# Patient Record
Sex: Female | Born: 1960 | Race: White | Hispanic: No | Marital: Married | Smoking: Former smoker
Health system: Southern US, Community
[De-identification: ages and names within clinical notes are randomized; demographics above are authoritative.]

## PROBLEM LIST (undated history)

## (undated) DIAGNOSIS — E785 Hyperlipidemia, unspecified: Secondary | ICD-10-CM

## (undated) DIAGNOSIS — K219 Gastro-esophageal reflux disease without esophagitis: Secondary | ICD-10-CM

## (undated) DIAGNOSIS — K5790 Diverticulosis of intestine, part unspecified, without perforation or abscess without bleeding: Secondary | ICD-10-CM

## (undated) HISTORY — DX: Hyperlipidemia, unspecified: E78.5

## (undated) HISTORY — DX: Gastro-esophageal reflux disease without esophagitis: K21.9

## (undated) HISTORY — PX: SIGMOIDOSCOPY: SUR1295

## (undated) HISTORY — DX: Diverticulosis of intestine, part unspecified, without perforation or abscess without bleeding: K57.90

## (undated) HISTORY — PX: POLYPECTOMY: SHX149

## (undated) HISTORY — PX: BREAST BIOPSY: SHX20

## (undated) HISTORY — PX: COLONOSCOPY: SHX174

---

## 2002-08-06 ENCOUNTER — Ambulatory Visit (HOSPITAL_COMMUNITY): Admission: RE | Admit: 2002-08-06 | Discharge: 2002-08-06 | Payer: Self-pay

## 2002-09-11 ENCOUNTER — Ambulatory Visit (HOSPITAL_COMMUNITY): Admission: RE | Admit: 2002-09-11 | Discharge: 2002-09-11 | Payer: Self-pay | Admitting: Gastroenterology

## 2005-02-24 ENCOUNTER — Other Ambulatory Visit: Admission: RE | Admit: 2005-02-24 | Discharge: 2005-02-24 | Payer: Self-pay | Admitting: Gynecology

## 2005-08-30 ENCOUNTER — Ambulatory Visit: Payer: Self-pay | Admitting: General Practice

## 2005-10-11 ENCOUNTER — Other Ambulatory Visit: Admission: RE | Admit: 2005-10-11 | Discharge: 2005-10-11 | Payer: Self-pay | Admitting: Gynecology

## 2006-07-14 ENCOUNTER — Other Ambulatory Visit: Admission: RE | Admit: 2006-07-14 | Discharge: 2006-07-14 | Payer: Self-pay | Admitting: Gynecology

## 2006-09-13 ENCOUNTER — Ambulatory Visit: Payer: Self-pay | Admitting: General Practice

## 2007-09-18 ENCOUNTER — Ambulatory Visit: Payer: Self-pay | Admitting: General Practice

## 2007-10-15 ENCOUNTER — Ambulatory Visit: Payer: Self-pay | Admitting: General Practice

## 2008-03-04 ENCOUNTER — Emergency Department (HOSPITAL_COMMUNITY): Admission: EM | Admit: 2008-03-04 | Discharge: 2008-03-04 | Payer: Self-pay | Admitting: Emergency Medicine

## 2008-05-26 ENCOUNTER — Ambulatory Visit: Payer: Self-pay | Admitting: General Practice

## 2008-07-28 ENCOUNTER — Encounter: Admission: RE | Admit: 2008-07-28 | Discharge: 2008-07-28 | Payer: Self-pay | Admitting: General Practice

## 2009-01-18 ENCOUNTER — Inpatient Hospital Stay (HOSPITAL_COMMUNITY): Admission: EM | Admit: 2009-01-18 | Discharge: 2009-01-19 | Payer: Self-pay | Admitting: *Deleted

## 2009-01-19 ENCOUNTER — Encounter (INDEPENDENT_AMBULATORY_CARE_PROVIDER_SITE_OTHER): Payer: Self-pay | Admitting: Gastroenterology

## 2009-07-20 ENCOUNTER — Ambulatory Visit: Payer: Self-pay | Admitting: Women's Health

## 2009-07-20 ENCOUNTER — Encounter: Payer: Self-pay | Admitting: Women's Health

## 2009-07-20 ENCOUNTER — Other Ambulatory Visit: Admission: RE | Admit: 2009-07-20 | Discharge: 2009-07-20 | Payer: Self-pay | Admitting: Gynecology

## 2009-09-30 ENCOUNTER — Ambulatory Visit: Payer: Self-pay | Admitting: General Practice

## 2009-10-19 ENCOUNTER — Ambulatory Visit: Payer: Self-pay | Admitting: General Practice

## 2010-01-18 ENCOUNTER — Ambulatory Visit: Payer: Self-pay | Admitting: Women's Health

## 2010-07-26 ENCOUNTER — Other Ambulatory Visit: Admission: RE | Admit: 2010-07-26 | Discharge: 2010-07-26 | Payer: Self-pay | Admitting: Gynecology

## 2010-07-26 ENCOUNTER — Ambulatory Visit: Payer: Self-pay | Admitting: Women's Health

## 2010-09-22 ENCOUNTER — Ambulatory Visit: Payer: Self-pay | Admitting: General Practice

## 2011-03-15 LAB — BASIC METABOLIC PANEL
BUN: 3 mg/dL — ABNORMAL LOW (ref 6–23)
BUN: 5 mg/dL — ABNORMAL LOW (ref 6–23)
BUN: 9 mg/dL (ref 6–23)
CO2: 30 mEq/L (ref 19–32)
Calcium: 8.3 mg/dL — ABNORMAL LOW (ref 8.4–10.5)
Chloride: 105 mEq/L (ref 96–112)
Chloride: 105 mEq/L (ref 96–112)
Creatinine, Ser: 0.61 mg/dL (ref 0.4–1.2)
Creatinine, Ser: 0.66 mg/dL (ref 0.4–1.2)
GFR calc non Af Amer: >60 mL/min (ref >60)
Glucose, Bld: 103 mg/dL — ABNORMAL HIGH (ref 70–99)
Glucose, Bld: 93 mg/dL (ref 70–99)
Potassium: 4.2 mEq/L (ref 3.5–5.1)

## 2011-03-15 LAB — DIFFERENTIAL
Eosinophils Relative: 6 % — ABNORMAL HIGH (ref 0–5)
Lymphocytes Relative: 34 % (ref 12–46)
Lymphs Abs: 2.3 10*3/uL (ref 0.7–4.0)
Monocytes Absolute: 0.6 10*3/uL (ref 0.1–1.0)
Monocytes Relative: 8 % (ref 3–12)

## 2011-03-15 LAB — URINALYSIS, ROUTINE W REFLEX MICROSCOPIC
Glucose, UA: NEGATIVE mg/dL
Hgb urine dipstick: NEGATIVE
Protein, ur: NEGATIVE mg/dL

## 2011-03-15 LAB — HEMOGLOBIN AND HEMATOCRIT, BLOOD
HCT: 35.2 % — ABNORMAL LOW (ref 36.0–46.0)
HCT: 35.8 % — ABNORMAL LOW (ref 36.0–46.0)
Hemoglobin: 11.8 g/dL — ABNORMAL LOW (ref 12.0–15.0)
Hemoglobin: 12.7 g/dL (ref 12.0–15.0)

## 2011-03-15 LAB — CBC
HCT: 36.1 % (ref 36.0–46.0)
Hemoglobin: 12.6 g/dL (ref 12.0–15.0)
RBC: 3.97 MIL/uL (ref 3.87–5.11)
WBC: 6.7 10*3/uL (ref 4.0–10.5)

## 2011-03-15 LAB — SAMPLE TO BLOOD BANK

## 2011-03-15 LAB — POCT PREGNANCY, URINE: Preg Test, Ur: NEGATIVE

## 2011-03-15 LAB — PROTIME-INR: Prothrombin Time: 13.1 seconds (ref 11.6–15.2)

## 2011-03-15 LAB — TYPE AND SCREEN
ABO/RH(D): O NEG
Antibody Screen: NEGATIVE

## 2011-04-12 NOTE — Discharge Summary (Signed)
Kimberly Hall, Kimberly Hall            ACCOUNT NO.:  [id number]   MEDICAL RECORD NO.:  [id number]          PATIENT TYPE:  INP   LOCATION:  6737                         FACILITY:  [facility]   PHYSICIAN:  Theodosia Paling, MD    DATE OF BIRTH:  04-13-1961   DATE OF ADMISSION:  01/18/2009  DATE OF DISCHARGE:  01/18/2009                               DISCHARGE SUMMARY   GI PHYSICIAN:  Lionel December, MD   ADMITTING HISTORY:  Please refer to the excellent admission note  dictated by Dr. Della Goo under history of present illness.   DISCHARGE DIAGNOSES:  1. Rectal bleed.  2. Colonic polyps.   DISCHARGE MEDICATIONS:  None.   CONSULTATION PERFORMED:  Dr. Karilyn Cota from GI was called to evaluate the  patient who evaluated the patient and recommended to follow up with her  tomorrow morning.  The patient expresses interest to go home as well.   HOSPITAL COURSE:  Following issues were addressed during the hospital  stay:  GI bleed.  Most likely it is diverticular/hemorrhoidal.  Last  colonoscopy was performed on the patient on September 11, 2008, which was  essentially negative.  H and H remains stable.  Hemodynamically, she  remains stable.  She did not have further episode of GI bleed.  The  patient will be following up with Dr. Karilyn Cota in her clinic tomorrow  morning.   DISPOSITION:  The patient is to follow with Dr. Karilyn Cota on January 19, 2009.   PROCEDURE PERFORMED:  None.   IMAGING PERFORMED:  None.   Total time spent in discharge of this patient 45 minutes.      Theodosia Paling, MD  Electronically Signed     NP/MEDQ  D:  01/18/2009  T:  01/19/2009  Job:  [id number]   cc:   Lionel December, M.D.

## 2011-04-12 NOTE — Consult Note (Signed)
NAMEMAILI, SHUTTERS NO.:  [id number]   MEDICAL RECORD NO.:  [id number]          PATIENT TYPE:  INP   LOCATION:  6737                         FACILITY:  [facility]   PHYSICIAN:  Graylin Shiver, M.D.   DATE OF BIRTH:  [birth year]   DATE OF CONSULTATION:  01/19/2009  DATE OF DISCHARGE:                                 CONSULTATION   We were asked to see this patient in consultation for rectal bleeding by  Dr. Glade Lloyd of the [facility].   HISTORY OF PRESENT ILLNESS:  This is a very pleasant 50 year old female  who speaks broken [place].  She has a history of grade 4 colon polyp in  1997 and she reports hematochezia like a small.  Increasing in amounts  over the last 5 days.  The patient describes abdominal pressure, but no  abdominal pain and her bowel movements tend to be constipated.  She has  been taking Aleve and ibuprofen for chronic headaches.  Her last  colonoscopy was in January 2009, copy of the reports on the chart.  She  has 3 hyperplastic polyps removed.  The patient does report some  hemorrhoids after the birth of her 2 children some 20 years ago but none  since.  Her hemoglobin is very stable.  The patient and family are  currently upset about lack of workup in the hospital so far this  admission.   Past medical history is significant only for colon polyps as noted above  and constipation predominant IVF.   Current medications at home are none except for Aleve and ibuprofen as  needed for headaches.  The patient reports that this is almost daily.   CURRENT ALLERGIES:  PENICILLIN.   Review of systems is significant for some left lower quadrant abdominal  pressure, recurrent headaches, no weight loss, no fever.   Social history is positive for occasional alcohol.  She says she smoked  for 25 years but quit smoking 7 or 8 years ago.  Currently works at  [employer].   Family history is unknown by the patient.   PHYSICAL EXAMINATION:   GENERAL:  She is alert and oriented in no  apparent distress.  She does speak broken [place].  VITAL SIGNS:  Temperature 98.2, pulse 69, respirations 18, blood  pressure is 106/82.  HEART:  Regular rate and rhythm.  LUNGS:  No wheezes or crackles.  ABDOMEN:  Soft, nontender, nondistended with good bowel sounds.   Labs show a hemoglobin of 12.5 this is stable, hematocrit 35.8.  Coags  are normal.  BUN 3, creatinine is 0.66.   ASSESSMENT:  Dr. Wandalee Ferdinand has seen and examined the patient, collected  history, and reviewed her chart.  His impression is this is a 47-year-  old female with recurrent rectal bleeding, possibly internal hemorrhoids  or diverticular bleed.   PLAN:  We will move forward with colonoscopy this afternoon.  Further  recommendations will be forthcoming after a colonoscopy.   Thanks very much for this consultation.      Stephani Police, PA    ______________________________  Graylin Shiver, M.D.  MLY/MEDQ  D:  01/19/2009  T:  01/20/2009  Job:  [id number]

## 2011-04-12 NOTE — Op Note (Signed)
Kimberly Hall, Kimberly Hall            ACCOUNT NO.:  [id number]   MEDICAL RECORD NO.:  [id number]          PATIENT TYPE:  INP   LOCATION:  6737                         FACILITY:  [facility]   PHYSICIAN:  Graylin Shiver, M.D.   DATE OF BIRTH:  Mar 11, 1961   DATE OF PROCEDURE:  01/19/2009  DATE OF DISCHARGE:  01/19/2009                               OPERATIVE REPORT   INDICATIONS FOR PROCEDURE:  Rectal bleeding.   Informed consent was obtained after explanation of the risks of  bleeding, infection, and perforation.   PREMEDICATION:  Fentanyl 60 mcg IV and Versed 5 mg IV.   PROCEDURE:  With the patient in the left lateral decubitus position, a  rectal exam was performed.  No masses were felt.  The Pentax colonoscope  was inserted into the rectum and advanced around the colon to the cecum.  Cecal landmarks were identified.  The cecum and ascending colon were  normal.  The transverse colon was normal.  The descending colon and  sigmoid were normal.  In the distal rectum extending from the anal verge  up to about 8 cm, the mucosa was inflamed and looked somewhat ulcerated  with exudate on it.  This looked like a proctitis.  Biopsies were  obtained.  She tolerated the procedure well without complications.   IMPRESSION:  Distal proctitis.   PLAN:  The biopsies will be checked.  The patient will be started on  Canasa suppository 1000 mg at bedtime.           ______________________________  Graylin Shiver, M.D.     SFG/MEDQ  D:  01/19/2009  T:  01/20/2009  Job:  [id number]   cc:   [facility]

## 2011-04-12 NOTE — H&P (Signed)
Kimberly Hall, Kimberly Hall            ACCOUNT NO.:  [id number]   MEDICAL RECORD NO.:  [id number]          PATIENT TYPE:  INP   LOCATION:  6737                         FACILITY:  [facility]   PHYSICIAN:  Della Goo, M.D. DATE OF BIRTH:  [birth year]   DATE OF ADMISSION:  01/17/2009  DATE OF DISCHARGE:                              HISTORY & PHYSICAL   PRIMARY CARE PHYSICIAN:  Unassigned.   CHIEF COMPLAINT:  Rectal bleeding.   HISTORY OF PRESENT ILLNESS:  This is a 50 year old female who presents  to the emergency department with complaints of rectal bleeding which she  has had for the past 5 days.  She reports having 3 to 4 episodes daily.  She states that she has been passing bright red blood.  She also reports  that the bleeding has been associated with lower abdominal pain and  rectal pain and pressure.  She states that she did have abdominal  discomfort for over 2 weeks prior to seeing any bloody stool.  She  states that she did not have any previous similar episodes.  The patient  has had colon polyps in the past and had a recent colonoscopy performed  in 2009 by Dr. Charna Elizabeth which revealed benign polyps at that time  which were also removed.  The patient denies having any nausea,  vomiting.  She denies having any decrease in appetite or intake of foods  or liquids.  She denies having any fevers, chills, chest pain, shortness  of breath.  She does report having weakness and mild lightheadedness.   When the patient was evaluated in the emergency department, her  hemoglobin was found to be 12.6 with hematocrit 36.1.  Her MCV was found  to be 91.0.  The patient was referred for admission.   PAST MEDICAL HISTORY:  Significant for colon polyps.   PAST SURGICAL HISTORY:  History of previous colonoscopies with polyp  removal.  The patient had a colonoscopy in 2003 by Dr. Loreta Ave and a repeat  colonoscopy in 2008 or 2009 by Dr. Loreta Ave.   MEDICATIONS:  None.   ALLERGIES:  PENICILLIN.   SOCIAL HISTORY:  The patient is a nonsmoker, nondrinker.   FAMILY HISTORY:  Negative for coronary artery disease, diabetes  mellitus, hypertension.  Positive for cancer in her father who had lung  cancer and was a heavy smoker.   REVIEW OF SYSTEMS:  Pertinents are mentioned above, otherwise they are  negative.   PHYSICAL EXAMINATION:  FINDINGS:  This is a 50 year old, overweight,  well-developed female in discomfort but no acute distress.  VITAL SIGNS: Temperature 96.6, blood pressure 134/91, heart rate 69,  respirations 18, O2 saturation 97%.  HEENT EXAMINATION:  Normocephalic, atraumatic.  There is no scleral  icterus.  Pupils are equally round, reactive to light.  Extraocular  movements are intact.  Funduscopic benign.  Nares patent bilaterally.  Oropharynx is clear.  NECK:  Supple, full range of motion.  No thyromegaly, adenopathy, or  jugular venous distention.  CARDIOVASCULAR:  Regular rate and rhythm.  No murmurs, gallops or rubs.  LUNGS: Clear to auscultation bilaterally.  ABDOMEN: Positive bowel sounds, soft,  nontender, nondistended.  EXTREMITIES: Without cyanosis, clubbing or edema.  NEUROLOGIC EXAMINATION:  Alert and oriented x3.  There are no focal  deficits.  RECTAL:  Examination performed by the EDP results of which were  Hemoccult positive.   LABORATORY STUDIES:  White blood cell count 6.7, hemoglobin 12.6,  hematocrit 36.1, platelets 173, MCV as mentioned above, neutrophils 51%  lymphocytes 34%.  Sodium 139, potassium 3.9, chloride 105, bicarb 30,  BUN 9, creatinine 0.61 and glucose 103.  Pro time 13.1, INR 1.0 and PTT  32.   ASSESSMENT:  A 50 year old female being admitted with:  1. Rectal bleeding.  2. Weakness secondary to #1.  3. Abdominal pain.   PLAN:  The patient will be admitted to a telemetry area for monitoring  and type and screen will be ordered.  At this time, the patient's  hemoglobin is good and the patient is hemodynamically stable.  However,   if the patient show signs of instability or continued bleeding, a  transfusion will be given.  IV Protonix has been ordered, however this  appears to be a lower GI bleed.  Serial H and H is will be performed to  monitor the patient's hemoglobin and hematocrit.  The patient will be  placed in SCDs for DVT prophylaxis and a GI consultation will be  requested with Dr. Loreta Ave or her on-call coverage.  Further workup will  ensue pending results of the patient's clinical course and her studies.      Della Goo, M.D.  Electronically Signed     HJ/MEDQ  D:  01/18/2009  T:  01/18/2009  Job:  [id number]

## 2011-04-15 NOTE — Op Note (Signed)
   [name], Kimberly Hall                        ACCOUNT NO.:  [id number]   MEDICAL RECORD NO.:  [id number]                   PATIENT TYPE:  AMB   LOCATION:  ENDO                                 FACILITY:  [facility]   PHYSICIAN:  Anselmo Rod, M.D.               DATE OF BIRTH:  [birth year]   DATE OF PROCEDURE:  09/11/2002  DATE OF DISCHARGE:                                 OPERATIVE REPORT   PROCEDURE:  Colonoscopy.   ENDOSCOPIST:  Charna Elizabeth, M.D.   INSTRUMENT USED:  Olympus video colonoscope (adjustable pediatric scope).   INDICATIONS FOR PROCEDURE:  [age] [place] female with a history  of colonic polyps last removed in 1998 by Dr. Carman Ching.  Rule out  recurrent polyps.  The patient also has had occasional blood with hard  stool.   PROCEDURE PERFORMED:  Informed consent was procured from the patient.  The  patient fasted for eight hours prior to the procedure and prepped with a  bottle of magnesium citrate and a gallon of GOLYTELY the night prior to the  procedure.   PREPROCEDURE PHYSICAL EXAMINATION:  VITAL SIGNS:  The patient had stable  vital signs.  NECK:  Supple.  CHEST:  Clear to auscultation.  HEART:  S1 and S2 regular.  ABDOMEN:  Soft with normal bowel sounds.   DESCRIPTION OF PROCEDURE:  The patient was placed in the left lateral  decubitus position, sedated with 80 mg of Demerol and 9 mg of Versed  intravenously.  Once the patient was adequately sedated and maintained on  low flow oxygen, continuous cardiac monitoring, the Olympus video  colonoscope was advanced from the rectum to the cecum without difficulty.  The entire colonic mucosa appeared normal with no masses, polyps, erosions,  ulcerations or diverticula.  The appendiceal orifice and ileocecal valve  were clearly visualized and photographed.   IMPRESSION:  Normal colonoscopy.   RECOMMENDATIONS:  1. Considering her personal history of colonic polyps, repeat colorectal     cancer  screening is recommended in the     next five years unless the patient develops any abnormal symptoms in the     interim.  2. A high fiber with liberal fluid intake has been advocated to promote soft     stools.  3. Outpatient followup on a p.r.n. basis.                                               Anselmo Rod, M.D.    JNM/MEDQ  D:  09/11/2002  T:  09/11/2002  Job:  [id number]   cc:   Gabriel Earing, MD  [address]  [place]  [place] [address]  Fax: [phone]

## 2011-08-23 LAB — POCT PREGNANCY, URINE: Operator id: 277751

## 2011-08-23 LAB — URINE MICROSCOPIC-ADD ON

## 2011-08-23 LAB — DIFFERENTIAL
Basophils Absolute: 0
Basophils Relative: 1
Eosinophils Relative: 5
Monocytes Absolute: 0.5

## 2011-08-23 LAB — URINALYSIS, ROUTINE W REFLEX MICROSCOPIC
Glucose, UA: NEGATIVE
Nitrite: POSITIVE — AB
Protein, ur: NEGATIVE
pH: 7

## 2011-08-23 LAB — CBC
HCT: 35.7 — ABNORMAL LOW
Hemoglobin: 12.1
MCHC: 34
RDW: 13.3

## 2011-08-23 LAB — APTT: aPTT: 30

## 2011-09-21 ENCOUNTER — Ambulatory Visit: Payer: Self-pay | Admitting: General Practice

## 2011-10-28 ENCOUNTER — Ambulatory Visit (INDEPENDENT_AMBULATORY_CARE_PROVIDER_SITE_OTHER): Payer: BC Managed Care – PPO | Admitting: Gastroenterology

## 2011-10-28 ENCOUNTER — Encounter: Payer: Self-pay | Admitting: Gastroenterology

## 2011-10-28 DIAGNOSIS — Z86010 Personal history of colonic polyps: Secondary | ICD-10-CM

## 2011-10-28 DIAGNOSIS — K6289 Other specified diseases of anus and rectum: Secondary | ICD-10-CM

## 2011-10-28 MED ORDER — HYOSCYAMINE SULFATE ER 0.375 MG PO TBCR: EXTENDED_RELEASE_TABLET | ORAL | Status: DC

## 2011-10-28 NOTE — Assessment & Plan Note (Signed)
Plan followup colonoscopy 2015 

## 2011-10-28 NOTE — Progress Notes (Signed)
History of Present Illness: Kimberly Hall is a 50 year old white female with history of proctitis here for evaluation of rectal bleeding. Over the past 2 weeks she's had limited rectal bleeding consisting of small amounts of bright red blood per rectum. Stools have been loose and accompanied by mild low abdominal pain. She has a history of proctitis for which she has been treated.  She underwent undergone colonoscopy in 2009 and 2010. She has a remote history of polyps. After 9 days of Rowasa suppositories her bleeding has subsided. She still is left with very mild lower bowel discomfort.    Past Medical History  Diagnosis Date  . Hyperlipemia   . History of colon polyps   . GERD (gastroesophageal reflux disease)   . Proctitis 2010    Colonoscopy--Dr. Evette Cristal    No past surgical history on file. family history includes Lung cancer in her father.  There is no history of Colon cancer. Current Outpatient Prescriptions  Medication Sig Dispense Refill  . mesalamine (CANASA) 1000 MG suppository Place 1,000 mg rectally at bedtime.        Marland Kitchen omeprazole (PRILOSEC) 20 MG capsule Take 20 mg by mouth daily.         Allergies as of 10/28/2011 - Review Complete 10/28/2011  Allergen Reaction Noted  . Penicillins  10/27/2011    reports that she has quit smoking. She has never used smokeless tobacco. She reports that she drinks alcohol. She reports that she does not use illicit drugs.     Review of Systems: Pertinent positive and negative review of systems were noted in the above HPI section. All other review of systems were otherwise negative.  Vital signs were reviewed in today's medical record Physical Exam: General: Well developed , well nourished, no acute distress Head: Normocephalic and atraumatic Eyes:  sclerae anicteric, EOMI Ears: Normal auditory acuity Mouth: No deformity or lesions Neck: Supple, no masses or thyromegaly Lungs: Clear throughout to auscultation Heart: Regular rate and  rhythm; no murmurs, rubs or bruits Abdomen: Soft, non tender and non distended. No masses, hepatosplenomegaly or hernias noted. Normal Bowel sounds Rectal:deferred Musculoskeletal: Symmetrical with no gross deformities  Skin: No lesions on visible extremities Pulses:  Normal pulses noted Extremities: No clubbing, cyanosis, edema or deformities noted Neurological: Alert oriented x 4, grossly nonfocal Cervical Nodes:  No significant cervical adenopathy Inguinal Nodes: No significant inguinal adenopathy Psychological:  Alert and cooperative. Normal mood and affect

## 2011-10-28 NOTE — Assessment & Plan Note (Signed)
She's had a recent flare that has responded to Rowasa suppositories  Recommendations #1 no further therapy at this point #2 hyomax when necessary abdominal pain

## 2011-10-28 NOTE — Patient Instructions (Signed)
We are sending in a prescription today to your pharmacy Call back as needed Your Follow up Colonoscopy will be due in 2015

## 2011-10-31 ENCOUNTER — Telehealth: Payer: Self-pay | Admitting: *Deleted

## 2011-10-31 MED ORDER — HYOSCYAMINE SULFATE ER 0.375 MG PO TB12: 0.3750 mg | ORAL_TABLET | Freq: Two times a day (BID) | ORAL | Status: DC | PRN

## 2011-10-31 NOTE — Telephone Encounter (Signed)
[facility] stated that the Hyomax DT has been discontinued Resent Hyoscyamine SR .[phone]

## 2012-09-10 ENCOUNTER — Encounter: Payer: Self-pay | Admitting: Women's Health

## 2012-09-10 ENCOUNTER — Ambulatory Visit (INDEPENDENT_AMBULATORY_CARE_PROVIDER_SITE_OTHER): Payer: BC Managed Care – PPO | Admitting: Women's Health

## 2012-09-10 VITALS — BP 114/82 | Ht 64.5 in | Wt 177.0 lb

## 2012-09-10 DIAGNOSIS — Z780 Asymptomatic menopausal state: Secondary | ICD-10-CM

## 2012-09-10 DIAGNOSIS — Z9889 Other specified postprocedural states: Secondary | ICD-10-CM

## 2012-09-10 DIAGNOSIS — Z01419 Encounter for gynecological examination (general) (routine) without abnormal findings: Secondary | ICD-10-CM

## 2012-09-10 NOTE — Progress Notes (Signed)
Kimberly Hall November 21, 1961 [id number]    History:    The patient presents for annual exam. Postmenopausal with no bleeding/no HRT. History of normal Paps. History of a benign breast biopsy 2009. Has labs and mammograms done at work at [employer]. Benign polyp 2009 had a normal colonoscopy 2010 history of colitis.   Past medical history, past surgical history, family history and social history were all reviewed and documented in the EPIC chart. From [place]. 2 children grown doing well. History of a kidney stone in 2011.   ROS:  A  ROS was performed and pertinent positives and negatives are included in the history.  Exam:  Filed Vitals:   09/10/12 1023  BP: 114/82    General appearance:  Normal Head/Neck:  Normal, without cervical or supraclavicular adenopathy. Thyroid:  Symmetrical, normal in size, without palpable masses or nodularity. Respiratory  Effort:  Normal  Auscultation:  Clear without wheezing or rhonchi Cardiovascular  Auscultation:  Regular rate, without rubs, murmurs or gallops  Edema/varicosities:  Not grossly evident Abdominal  Soft,nontender, without masses, guarding or rebound.  Liver/spleen:  No organomegaly noted  Hernia:  None appreciated  Skin  Inspection:  Grossly normal  Palpation:  Grossly normal Neurologic/psychiatric  Orientation:  Normal with appropriate conversation.  Mood/affect:  Normal  Genitourinary    Breasts: Examined lying and sitting.     Right: Without masses, retractions, discharge or axillary adenopathy/slight tenderness outer aspect no palpable change.     Left: Without masses, retractions, discharge or axillary adenopathy.   Inguinal/mons:  Normal without inguinal adenopathy  External genitalia:  Normal  BUS/Urethra/Skene's glands:  Normal  Bladder:  Normal  Vagina:  Normal  Cervix:  Normal  Uterus:   normal in size, shape and contour.  Midline and mobile  Adnexa/parametria:     Rt: Without masses or  tenderness.   Lt: Without masses or tenderness.  Anus and perineum: Normal  Digital rectal exam: Normal sphincter tone without palpated masses or tenderness  Assessment/Plan:  51 y.o. M. WF G4 P2 for annual exam with complaint of occasional right outer breast discomfort, denies palpable change in exam.  Normal postmenopausal exam History of benign colon polyps 2009/no polyps colonoscopy 2010/history of colitis Labs and mammograms done at work annually   Plan: SBE's, continue annual mammogram and have reports sent to our office. Slight language barrier, instructed to have labs/mammograms faxed to [place], number given. DEXA will schedule here. Reviewed importance of calcium rich foods, vitamin D 2000 daily, regular exercise. Has followup with GI/ Dr. Arlyce Dice. No Pap, history of normal Pap 2012, new screening guidelines reviewed.    Harrington Challenger WHNP, 11:06 AM 09/10/2012

## 2012-09-10 NOTE — Patient Instructions (Addendum)
Vit D 2000 daily Health Recommendations for Postmenopausal Women Based on the Results of the Women's Health Initiative (WHI) and Other Studies The WHI is a major 15-year research program to address the most common causes of death, disability and poor quality of life in postmenopausal women. Some of these causes are heart disease, cancer, bone loss (osteoporosis) and others. Taking into account all of the findings from WHI and other studies, here are bottom-line health recommendations for women: CARDIOVASCULAR DISEASE Heart Disease: A heart attack is a medical emergency. Know the signs and symptoms of a heart attack. Hormone therapy should not be used to prevent heart disease. In women with heart disease, hormone therapy should not be used to prevent further disease. Hormone therapy increases the risk of blood clots. Below are things women can do to reduce their risk for heart disease.   Do not smoke. If you smoke, quit. Women who smoke are 2 to 6 times more likely to suffer a heart attack than non-smoking women.  Aim for a healthy weight. Being overweight causes many preventable deaths. Eat a healthy and balanced diet and drink an adequate amount of liquids.  Get moving. Make a commitment to be more physically active. Aim for 30 minutes of activity on most, if not all days of the week.  Eat for heart health. Choose a diet that is low in saturated fat, trans fat, and cholesterol. Include whole grains, vegetables, and fruits. Read the labels on the food container before buying it.  Know your numbers. Ask your caregiver to check your blood pressure, cholesterol (total, HDL, LDL, triglycerides) and blood glucose. Work with your caregiver to improve any numbers that are not normal.  High blood pressure. Limit or stop your table salt intake (try salt substitute and food seasonings), avoid salty foods and drinks. Read the labels on the food container before buying it. Avoid becoming overweight by eating  well and exercising. STROKE  Stroke is a medical emergency. Stroke can be the result of a blood clot in the blood vessel in the brain or by a brain hemorrhage (bleeding). Know the signs and symptoms of a stroke. To lower the risk of developing a stroke:  Avoid fatty foods.  Quit smoking.  Control your diabetes, blood pressure, and irregular heart rate. THROMBOPHLIBITIS (BLOOD CLOT) OF THE LEG  Hormone treatment is a big cause of developing blood clots in the leg. Becoming overweight and leading a stationary lifestyle also may contribute to developing blood clots. Controlling your diet and exercising will help lower the risk of developing blood clots. CANCER SCREENING  Breast Cancer: Women should take steps to reduce their risk of breast cancer. This includes having regular mammograms, monthly self breast exams and regular breast exams by your caregiver. Have a mammogram every one to two years if you are 40 to 52 years old. Have a mammogram annually if you are 50 years old or older depending on your risk factors. Women who are high risk for breast cancer may need more frequent mammograms. There are tests available (testing the genes in your body) if you have family history of breast cancer called BRCA 1 and 2. These tests can help determine the risks of developing breast cancer.  Intestinal or Stomach Cancer: Women should talk to their caregiver about when to start screening, what tests and how often they should be done, and the benefits and risks of doing these tests. Tests to consider are a rectal exam, fecal occult blood, sigmoidoscopy, colononoscoby, barium enema   and upper GI series of the stomach. Depending on the age, you may want to get a medical and family history of colon cancer. Women who are high risk may need to be screened at an earlier age and more often.  Cervical Cancer: A Pap test of the cervix should be done every year and every 3 years when there has been three straight years of a  normal Pap test. Women with an abnormal Pap test should be screened more often or have a cervical biopsy depending on your caregiver's recommendation.  Uterine Cancer: If you have vaginal bleeding after you are in the menopause, it should be evaluated by your caregiver.  Ovarian cancer: There are no reliable tests available to screen for ovarian cancer at this time except for yearly pelvic exams.  Lung Cancer: Yearly chest X-rays can detect lung cancer and should be done on high risk women, such as cigarette smokers and women with chronic lung disease (emphysemia).  Skin Cancer: A complete body skin exam should be done at your yearly examination. Avoid overexposure to the sun and ultraviolet light lamps. Use a strong sun block cream when in the sun. All of these things are important in lowering the risk of skin cancer. MENOPAUSE Menopause Symptoms: Hormone therapy products are effective for treating symptoms associated with menopause:  Moderate to severe hot flashes.  Night sweats.  Mood swings.  Headaches.  Tiredness.  Loss of sex drive.  Insomnia.  Other symptoms. However, hormone therapy products carry serious risks, especially in older women. Women who use or are thinking about using estrogen or estrogen with progestin treatments should discuss that with their caregiver. Your caregiver will know if the benefits outweigh the risks. The Food and Drug Administration (FDA) has concluded that hormone therapy should be used only at the lowest doses and for the shortest amount of time to reach treatment goals. It is not known at what doses there may be less risk of serious side effects. There are other treatments such as herbal medication (not controlled or regulated by the FDA), group therapy, counseling and acupuncture that may be helpful. OSTEOPOROSIS Protecting Against Bone Loss and Preventing Fracture: If hormone therapy is used for prevention of bone loss (osteoporosis), the risks for  bone loss must outweigh the risk of the therapy. Women considering taking hormone therapy for bone loss should ask their health care providers about other medications (fosamax and boniva) that are considered safe and effective for preventing bone loss and bone fractures. To guard against bone loss or fractures, it is recommended that women should take at least 1000-1500 mg of calcium and 400-800 IU of vitamin D daily in divided doses. Smoking and excessive alcohol intake increases the risk of osteoporosis. Eat foods rich in calcium and vitamin D and do weight bearing exercises several times a week as your caregiver suggests. DIABETES Diabetes Melitus: Women with Type I or Type 2 diabetes should keep their diabetes in control with diet, exercise and medication. Avoid too many sweets, starchy and fatty foods. Being overweight can affect your diabetes. COGNITION AND MEMORY Cognition and Memory: Menopausal hormone therapy is not recommended for the prevention of cognitive disorders such as Alzheimer's disease or memory loss. WHI found that women treated with hormone therapy have a greater risk of developing dementia.  DEPRESSION  Depression may occur at any age, but is common in elderly women. The reasons may be because of physical, medical, social (loneliness), financial and/or economic problems and needs. Becoming involved with church,   volunteer or social groups, seeking treatment for any physical or medical problems is recommended. Also, look into getting professional advice for any economic or financial problems. ACCIDENTS  Accidents are common and can be serious in the elderly woman. Prepare your house to prevent accidents. Eliminate throw rugs, use hip protectors, place hand bars in the bath, shower and toilet areas. Avoid wearing high heel shoes and walking on wet, snowy and icy areas. Stop driving if you have vision, hearing problems or are unsteady with you movements and reflexes. RHEUMATOID  ARTHRITIS Rheumatoid arthritis causes pain, swelling and stiffness of your bone joints. It can limit many of your activities. Over-the-counter medications may help, but prescription medications may be necessary. Talk with your caregiver about this. Exercise (walking, water aerobics), good posture, using splints on painful joints, warm baths or applying warm compresses to stiff joints and cold compresses to painful joints may be helpful. Smoking and excessive drinking may worsen the symptoms of arthritis. Seek help from a physical therapist if the arthritis is becoming a problem with your daily activities. IMMUNIZATIONS  Several immunizations are important to have during your senior years, including:   Tetanus and a diptheria shot booster every 10 years.  Influenza every year before the flu season begins.  Pneumonia vaccine.  Shingles vaccine.  Others as indicated (example: H1N1 vaccine). Document Released: 01/06/2006 Document Revised: 02/06/2012 Document Reviewed: 09/01/2008 [employer] Patient Information 2013 [employer].  

## 2012-10-16 ENCOUNTER — Ambulatory Visit: Payer: Self-pay | Admitting: General Surgery

## 2012-10-23 ENCOUNTER — Other Ambulatory Visit: Payer: Self-pay | Admitting: Gynecology

## 2012-10-23 ENCOUNTER — Ambulatory Visit (INDEPENDENT_AMBULATORY_CARE_PROVIDER_SITE_OTHER): Payer: BC Managed Care – PPO

## 2012-10-23 DIAGNOSIS — M949 Disorder of cartilage, unspecified: Secondary | ICD-10-CM

## 2012-10-23 DIAGNOSIS — Z780 Asymptomatic menopausal state: Secondary | ICD-10-CM

## 2012-10-23 DIAGNOSIS — M899 Disorder of bone, unspecified: Secondary | ICD-10-CM

## 2012-10-23 DIAGNOSIS — M8580 Other specified disorders of bone density and structure, unspecified site: Secondary | ICD-10-CM

## 2013-02-10 IMAGING — MG MM CAD DIAGNOSTIC MAMMO
1 series · 7 of 7 positions shown · non-contrast
Comparison: none

REASON FOR EXAM: RT BR CYST AND YRLY
COMMENTS:

PROCEDURE:     MAM - MAM DGTL DIAGNOSTIC MAMMO W/CAD  - October 16, 2012  [DATE]
RESULT:     Comparison is made to prior studies dated 05/27/2003, 09/21/2011,
09/22/2010 and10/19/2009.

[R CC · right · 7 of 7 slices shown]
[im 1/7]
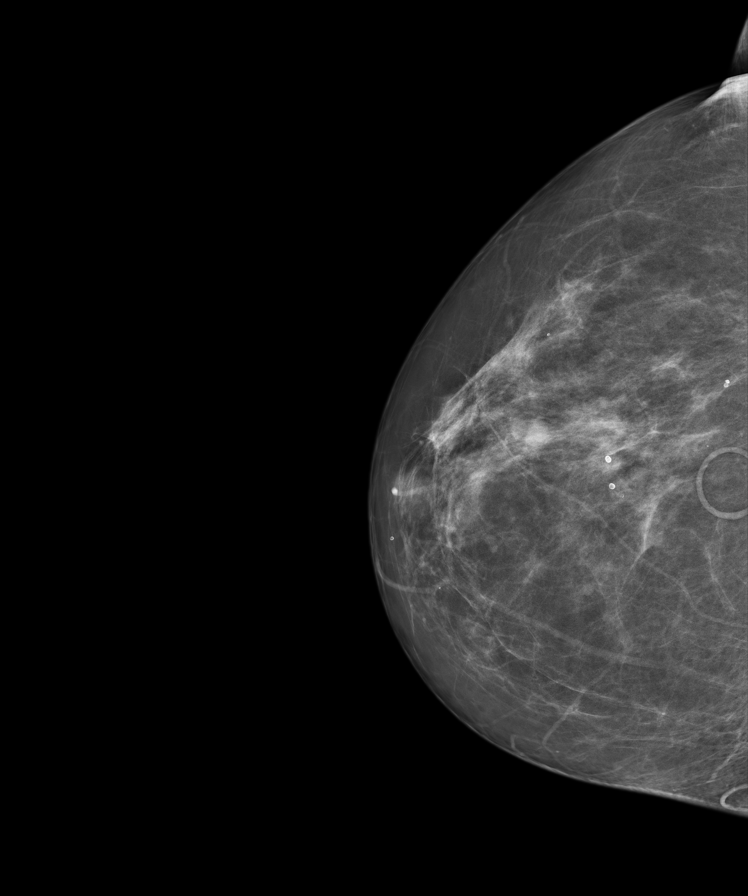
[im 2/7]
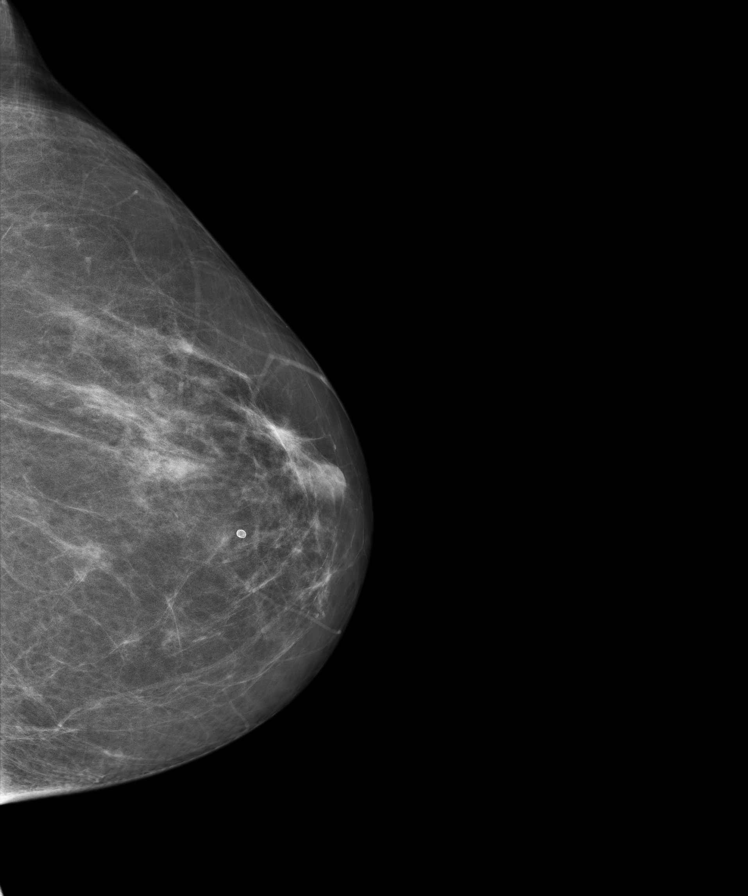
[im 3/7]
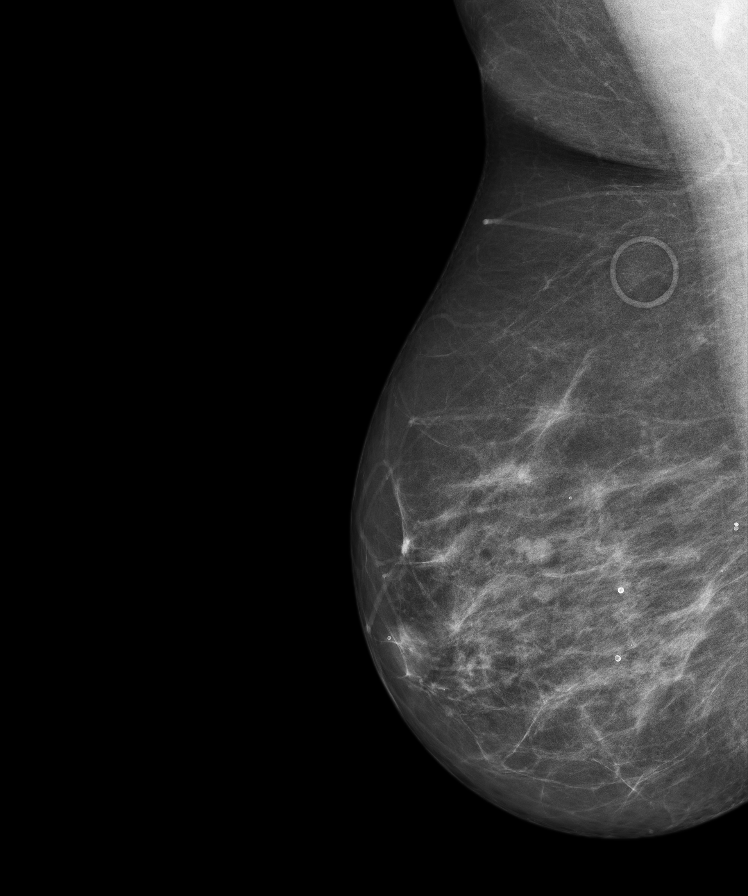
[im 4/7]
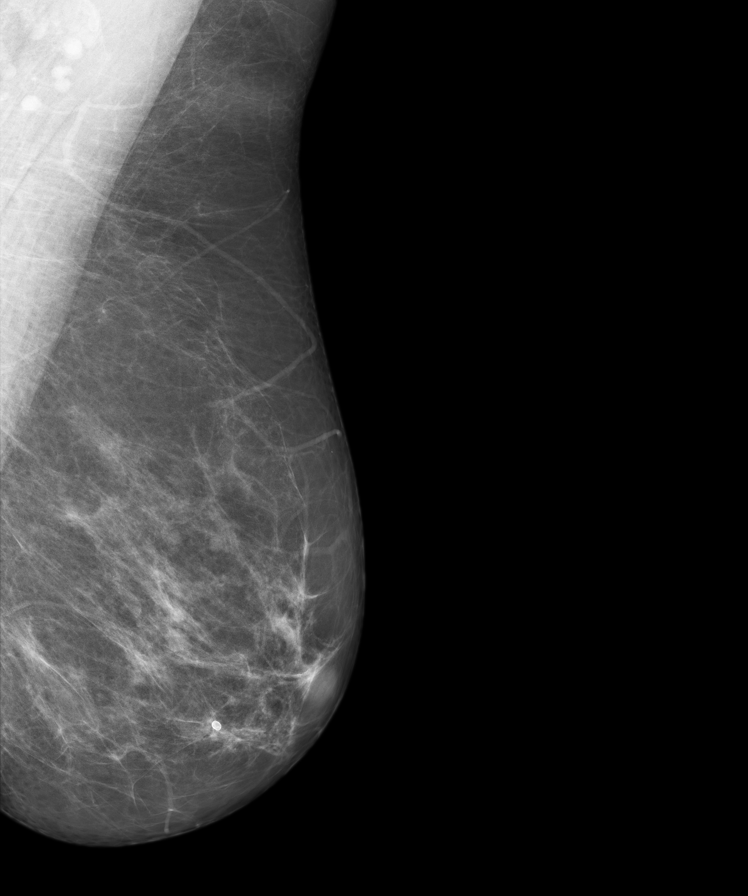
[im 5/7]
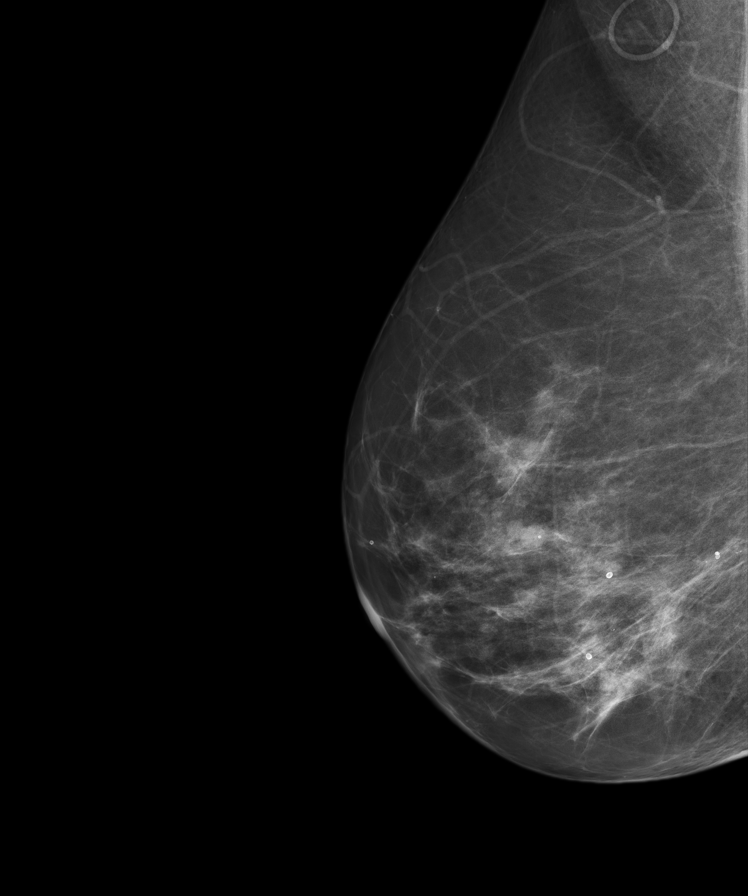
[im 6/7]
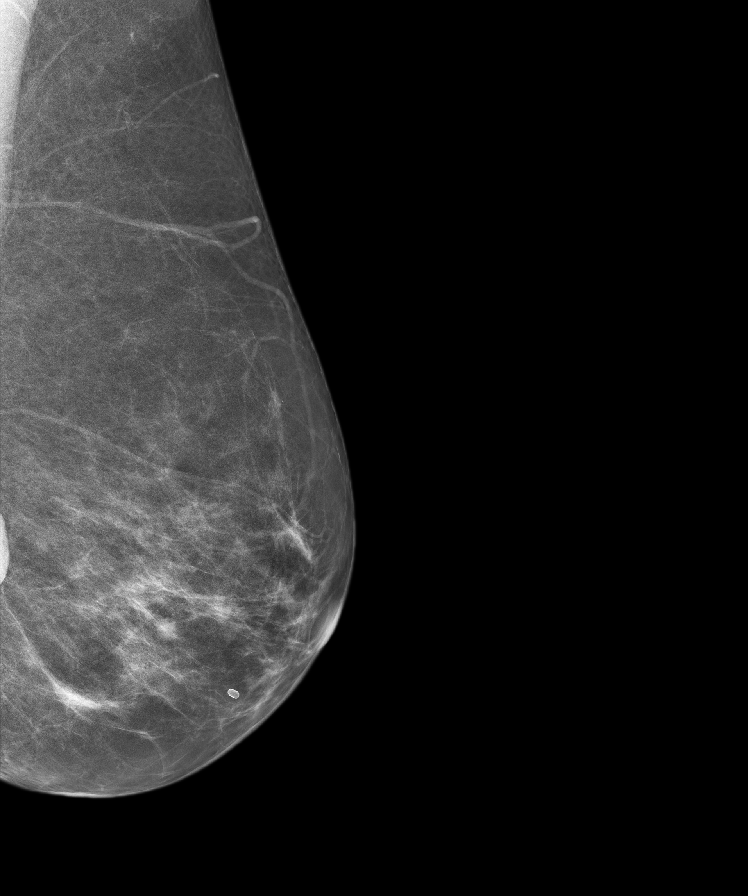
[im 7/7]
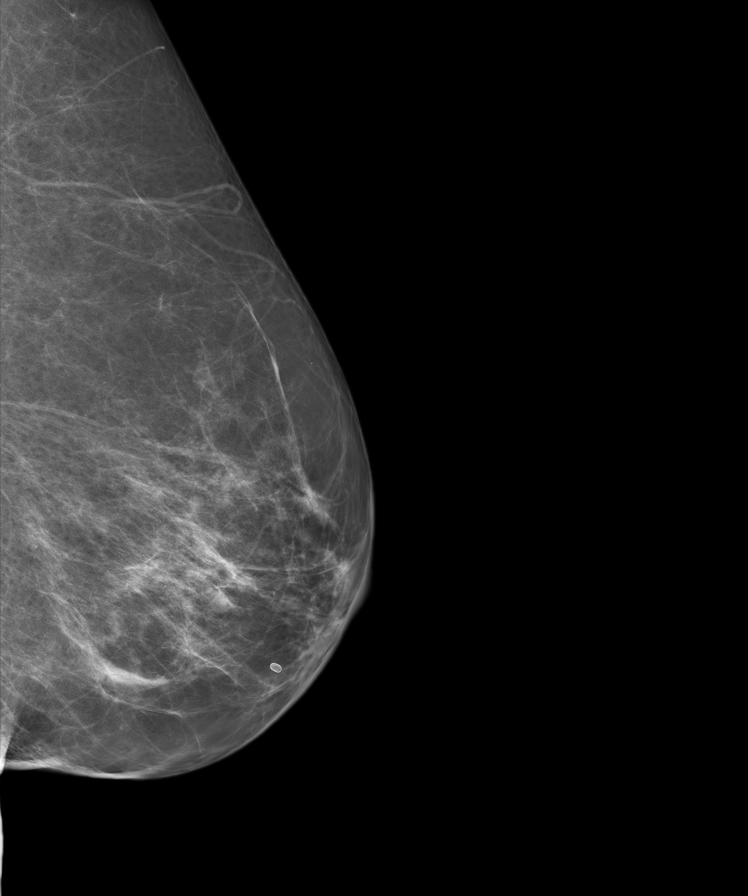

[7 of 7 positions shown; findings below may reference images not displayed]

FINDINGS: The breasts demonstrate a moderately dense heterogeneous
parenchymal pattern. Multiple rounded nodules are identified within the
right breast which appears stable. Benign-appearing calcifications are
identified within the right and left breast. There is no new radiographic
evidence to suggest malignancy.
IMPRESSION: BI-RADS: Category 2 - Benign Finding.

A NEGATIVE MAMMOGRAM REPORT DOES NOT PRECLUDE BIOPSY OR OTHER EVALUATION OF
A CLINICALLY PALPABLE OR OTHERWISE SUSPICIOUS MASS OR LESION. BREAST CANCER
MAY NOT BE DETECTED BY MAMMOGRAPHY IN UP TO 10% OF CASES.

## 2013-09-16 ENCOUNTER — Encounter: Payer: BC Managed Care – PPO | Admitting: Women's Health

## 2013-09-26 ENCOUNTER — Ambulatory Visit: Payer: Self-pay | Admitting: General Practice

## 2013-10-15 ENCOUNTER — Encounter: Payer: Self-pay | Admitting: Women's Health

## 2013-10-29 ENCOUNTER — Ambulatory Visit (INDEPENDENT_AMBULATORY_CARE_PROVIDER_SITE_OTHER): Payer: PRIVATE HEALTH INSURANCE | Admitting: Women's Health

## 2013-10-29 ENCOUNTER — Encounter: Payer: Self-pay | Admitting: Women's Health

## 2013-10-29 VITALS — BP 122/80 | Ht 63.5 in | Wt 179.0 lb

## 2013-10-29 DIAGNOSIS — Z86010 Personal history of colonic polyps: Secondary | ICD-10-CM

## 2013-10-29 DIAGNOSIS — M8580 Other specified disorders of bone density and structure, unspecified site: Secondary | ICD-10-CM | POA: Insufficient documentation

## 2013-10-29 DIAGNOSIS — M899 Disorder of bone, unspecified: Secondary | ICD-10-CM

## 2013-10-29 DIAGNOSIS — Z01419 Encounter for gynecological examination (general) (routine) without abnormal findings: Secondary | ICD-10-CM

## 2013-10-29 NOTE — Patient Instructions (Signed)
Vit D  2000 iu daily Health Recommendations for Postmenopausal Women Respected and ongoing research has looked at the most common causes of death, disability, and poor quality of life in postmenopausal women. The causes include heart disease, diseases of blood vessels, diabetes, depression, cancer, and bone loss (osteoporosis). Many things can be done to help lower the chances of developing these and other common problems: CARDIOVASCULAR DISEASE Heart Disease: A heart attack is a medical emergency. Know the signs and symptoms of a heart attack. Below are things women can do to reduce their risk for heart disease.   Do not smoke. If you smoke, quit.  Aim for a healthy weight. Being overweight causes many preventable deaths. Eat a healthy and balanced diet and drink an adequate amount of liquids.  Get moving. Make a commitment to be more physically active. Aim for 30 minutes of activity on most, if not all days of the week.  Eat for heart health. Choose a diet that is low in saturated fat and cholesterol and eliminate trans fat. Include whole grains, vegetables, and fruits. Read and understand the labels on food containers before buying.  Know your numbers. Ask your caregiver to check your blood pressure, cholesterol (total, HDL, LDL, triglycerides) and blood glucose. Work with your caregiver on improving your entire clinical picture.  High blood pressure. Limit or stop your table salt intake (try salt substitute and food seasonings). Avoid salty foods and drinks. Read labels on food containers before buying. Eating well and exercising can help control high blood pressure. STROKE  Stroke is a medical emergency. Stroke may be the result of a blood clot in a blood vessel in the brain or by a brain hemorrhage (bleeding). Know the signs and symptoms of a stroke. To lower the risk of developing a stroke:  Avoid fatty foods.  Quit smoking.  Control your diabetes, blood pressure, and irregular heart  rate. THROMBOPHLEBITIS (BLOOD CLOT) OF THE LEG  Becoming overweight and leading a stationary lifestyle may also contribute to developing blood clots. Controlling your diet and exercising will help lower the risk of developing blood clots. CANCER SCREENING  Breast Cancer: Take steps to reduce your risk of breast cancer.  You should practice "breast self-awareness." This means understanding the normal appearance and feel of your breasts and should include breast self-examination. Any changes detected, no matter how small, should be reported to your caregiver.  After age 59, you should have a clinical breast exam (CBE) every year.  Starting at age 43, you should consider having a mammogram (breast X-ray) every year.  If you have a family history of breast cancer, talk to your caregiver about genetic screening.  If you are at high risk for breast cancer, talk to your caregiver about having an MRI and a mammogram every year.  Intestinal or Stomach Cancer: Tests to consider are a rectal exam, fecal occult blood, sigmoidoscopy, and colonoscopy. Women who are high risk may need to be screened at an earlier age and more often.  Cervical Cancer:  Beginning at age 30, you should have a Pap test every 3 years as long as the past 3 Pap tests have been normal.  If you have had past treatment for cervical cancer or a condition that could lead to cancer, you need Pap tests and screening for cancer for at least 20 years after your treatment.  If you had a hysterectomy for a problem that was not cancer or a condition that could lead to cancer, then you  no longer need Pap tests.  If you are between ages 60 and 69, and you have had normal Pap tests going back 10 years, you no longer need Pap tests.  If Pap tests have been discontinued, risk factors (such as a new sexual partner) need to be reassessed to determine if screening should be resumed.  Some medical problems can increase the chance of getting  cervical cancer. In these cases, your caregiver may recommend more frequent screening and Pap tests.  Uterine Cancer: If you have vaginal bleeding after reaching menopause, you should notify your caregiver.  Ovarian cancer: Other than yearly pelvic exams, there are no reliable tests available to screen for ovarian cancer at this time except for yearly pelvic exams.  Lung Cancer: Yearly chest X-rays can detect lung cancer and should be done on high risk women, such as cigarette smokers and women with chronic lung disease (emphysema).  Skin Cancer: A complete body skin exam should be done at your yearly examination. Avoid overexposure to the sun and ultraviolet light lamps. Use a strong sun block cream when in the sun. All of these things are important in lowering the risk of skin cancer. MENOPAUSE Menopause Symptoms: Hormone therapy products are effective for treating symptoms associated with menopause:  Moderate to severe hot flashes.  Night sweats.  Mood swings.  Headaches.  Tiredness.  Loss of sex drive.  Insomnia.  Other symptoms. Hormone replacement carries certain risks, especially in older women. Women who use or are thinking about using estrogen or estrogen with progestin treatments should discuss that with their caregiver. Your caregiver will help you understand the benefits and risks. The ideal dose of hormone replacement therapy is not known. The Food and Drug Administration (FDA) has concluded that hormone therapy should be used only at the lowest doses and for the shortest amount of time to reach treatment goals.  OSTEOPOROSIS Protecting Against Bone Loss and Preventing Fracture: If you use hormone therapy for prevention of bone loss (osteoporosis), the risks for bone loss must outweigh the risk of the therapy. Ask your caregiver about other medications known to be safe and effective for preventing bone loss and fractures. To guard against bone loss or fractures, the  following is recommended:  If you are less than age 29, take 1000 mg of calcium and at least 600 mg of Vitamin D per day.  If you are greater than age 45 but less than age 82, take 1200 mg of calcium and at least 600 mg of Vitamin D per day.  If you are greater than age 78, take 1200 mg of calcium and at least 800 mg of Vitamin D per day. Smoking and excessive alcohol intake increases the risk of osteoporosis. Eat foods rich in calcium and vitamin D and do weight bearing exercises several times a week as your caregiver suggests. DIABETES Diabetes Melitus: If you have Type I or Type 2 diabetes, you should keep your blood sugar under control with diet, exercise and recommended medication. Avoid too many sweets, starchy and fatty foods. Being overweight can make control more difficult. COGNITION AND MEMORY Cognition and Memory: Menopausal hormone therapy is not recommended for the prevention of cognitive disorders such as Alzheimer's disease or memory loss.  DEPRESSION  Depression may occur at any age, but is common in elderly women. The reasons may be because of physical, medical, social (loneliness), or financial problems and needs. If you are experiencing depression because of medical problems and control of symptoms, talk to your  caregiver about this. Physical activity and exercise may help with mood and sleep. Community and volunteer involvement may help your sense of value and worth. If you have depression and you feel that the problem is getting worse or becoming severe, talk to your caregiver about treatment options that are best for you. ACCIDENTS  Accidents are common and can be serious in the elderly woman. Prepare your house to prevent accidents. Eliminate throw rugs, place hand bars in the bath, shower and toilet areas. Avoid wearing high heeled shoes or walking on wet, snowy, and icy areas. Limit or stop driving if you have vision or hearing problems, or you feel you are unsteady with you  movements and reflexes. HEPATITIS C Hepatitis C is a type of viral infection affecting the liver. It is spread mainly through contact with blood from an infected person. It can be treated, but if left untreated, it can lead to severe liver damage over years. Many people who are infected do not know that the virus is in their blood. If you are a "baby-boomer", it is recommended that you have one screening test for Hepatitis C. IMMUNIZATIONS  Several immunizations are important to consider having during your senior years, including:   Tetanus, diptheria, and pertussis booster shot.  Influenza every year before the flu season begins.  Pneumonia vaccine.  Shingles vaccine.  Others as indicated based on your specific needs. Talk to your caregiver about these. Document Released: 01/06/2006 Document Revised: 10/31/2012 Document Reviewed: 09/01/2008 [facility] Patient Information 2014 [place], [place].

## 2013-10-29 NOTE — Progress Notes (Signed)
Kimberly Hall [birth year] [id number]    History:    The patient presents for annual exam.  Postmenopausal/no bleeding/no HRT. Normal Pap and mammogram history. Mammograms are not in the chart, haddone at work at replacements yearly, reports as normal. Reports normal labs at work screening. Benign colon polyp 2009, negative colonoscopy 2010. 10/2012 Osteopenia, T score  -1.1 left femoral neck, FRAX  8.8%/0.5%.  Past medical history, past surgical history, family history and social history were all reviewed and documented in the EPIC chart. From [place], 2 children son lives here in [place] daughter lives in [place]. Father died of lung cancer, no diabetes history.   ROS:  A  ROS was performed and pertinent positives and negatives are included in the history.  Exam:  Filed Vitals:   10/29/13 1409  BP: 122/80    General appearance:  Normal Head/Neck:  Normal, without cervical or supraclavicular adenopathy. Thyroid:  Symmetrical, normal in size, without palpable masses or nodularity. Respiratory  Effort:  Normal  Auscultation:  Clear without wheezing or rhonchi Cardiovascular  Auscultation:  Regular rate, without rubs, murmurs or gallops  Edema/varicosities:  Not grossly evident Abdominal  Soft,nontender, without masses, guarding or rebound.  Liver/spleen:  No organomegaly noted  Hernia:  None appreciated  Skin  Inspection:  Grossly normal  Palpation:  Grossly normal Neurologic/psychiatric  Orientation:  Normal with appropriate conversation.  Mood/affect:  Normal  Genitourinary    Breasts: Examined lying and sitting.     Right: Without masses, retractions, discharge or axillary adenopathy.     Left: Without masses, retractions, discharge or axillary adenopathy.   Inguinal/mons:  Normal without inguinal adenopathy  External genitalia:  Normal  BUS/Urethra/Skene's glands:  Normal  Bladder:  Normal  Vagina:  Normal  Cervix:  Normal  Uterus:   normal in size, shape and  contour.  Midline and mobile  Adnexa/parametria:     Rt: Without masses or tenderness.   Lt: Without masses or tenderness.  Anus and perineum: Normal  Digital rectal exam: Normal sphincter tone without palpated masses or tenderness  Assessment/Plan:  52 y.o.MWF G2P2  for annual exam with no complaints.  Normal postmenopausal exam/no HRT/no bleeding Osteopenia  Plan: SBE's, continue annual mammogram, instructed to have reports sent to our office. Continue labs at work, instructed to fax results to our office. Reviewed importance of regular exercise, calcium rich diet, vitamin D 2000 daily, home safety and fall prevention discussed. UA, Pap, Pap normal 2012, new screening guidelines reviewed.   Harrington Challenger [facility], 2:29 PM 10/29/2013

## 2013-10-30 ENCOUNTER — Other Ambulatory Visit (HOSPITAL_COMMUNITY)
Admission: RE | Admit: 2013-10-30 | Discharge: 2013-10-30 | Disposition: A | Discharge status: Home / Self Care | Payer: PRIVATE HEALTH INSURANCE | Source: Ambulatory Visit | Attending: Gynecology | Admitting: Gynecology

## 2013-10-30 DIAGNOSIS — Z01419 Encounter for gynecological examination (general) (routine) without abnormal findings: Secondary | ICD-10-CM | POA: Insufficient documentation

## 2013-10-30 LAB — URINALYSIS W MICROSCOPIC + REFLEX CULTURE
Bacteria, UA: NONE SEEN
Casts: NONE SEEN
Crystals: NONE SEEN
Ketones, ur: NEGATIVE mg/dL
Nitrite: NEGATIVE
Specific Gravity, Urine: 1.021 (ref 1.005–1.030)
pH: 6.5 (ref 5.0–8.0)

## 2013-10-30 NOTE — Addendum Note (Signed)
Addended by: Richardson Chiquito on: 10/30/2013 12:12 PM   Modules accepted: Orders

## 2014-07-29 ENCOUNTER — Encounter: Payer: Self-pay | Admitting: Gastroenterology

## 2014-09-23 ENCOUNTER — Ambulatory Visit: Payer: Self-pay | Admitting: General Practice

## 2014-09-24 ENCOUNTER — Encounter: Payer: Self-pay | Admitting: Women's Health

## 2014-09-29 ENCOUNTER — Encounter: Payer: Self-pay | Admitting: Women's Health

## 2014-11-04 ENCOUNTER — Ambulatory Visit (INDEPENDENT_AMBULATORY_CARE_PROVIDER_SITE_OTHER): Payer: PRIVATE HEALTH INSURANCE | Admitting: Women's Health

## 2014-11-04 ENCOUNTER — Encounter: Payer: Self-pay | Admitting: Women's Health

## 2014-11-04 VITALS — BP 121/82 | Ht 64.0 in | Wt 184.6 lb

## 2014-11-04 DIAGNOSIS — Z01419 Encounter for gynecological examination (general) (routine) without abnormal findings: Secondary | ICD-10-CM

## 2014-11-04 NOTE — Progress Notes (Signed)
Albie Bazin 24-Mar-1961 [id number]    History:    Presents for annual exam.  Postmenopausal/no bleeding/no HRT. Normal Pap and mammogram history. Benign breast biopsy 2009. Benign colon polyps 2010 and has follow-up scheduled January 2016. 2013 T score -1.1 left femoral neck FRAX 8.8%/0.5%. Labs at work reviewed, CMP, CBC, thyroid function all normal. Lipid panel slightly elevated. Declines flu vaccine.  Past medical history, past surgical history, family history and social history were all reviewed and documented in the EPIC chart. From [place],  father died from lung cancer. Works at [employer], physical job. 2 children both doing well.  ROS:  A  12 point ROS was performed and pertinent positives and negatives are included.  Exam:  Filed Vitals:   11/04/14 1034  BP: 121/82    General appearance:  Normal Thyroid:  Symmetrical, normal in size, without palpable masses or nodularity. Respiratory  Auscultation:  Clear without wheezing or rhonchi Cardiovascular  Auscultation:  Regular rate, without rubs, murmurs or gallops  Edema/varicosities:  Not grossly evident Abdominal  Soft,nontender, without masses, guarding or rebound.  Liver/spleen:  No organomegaly noted  Hernia:  None appreciated  Skin  Inspection:  Grossly normal   Breasts: Examined lying and sitting.     Right: Without masses, retractions, discharge or axillary adenopathy.     Left: Without masses, retractions, discharge or axillary adenopathy. Gentitourinary   Inguinal/mons:  Normal without inguinal adenopathy  External genitalia:  Normal  BUS/Urethra/Skene's glands:  Normal  Vagina:  Normal  Cervix:  Normal  Uterus:  normal in size, shape and contour.  Midline and mobile  Adnexa/parametria:     Rt: Without masses or tenderness.   Lt: Without masses or tenderness.  Anus and perineum: Normal  Digital rectal exam: Normal sphincter tone without palpated masses or tenderness  Assessment/Plan:  53 y.o. MWF G2P2  for annual exam with no complaints.  Postmenopausal/no bleeding/no HRT Overweight Mild osteopenia without elevated FRAX  Plan: SBE's, continue annual mammogram, calcium rich diet, vitamin D 2000 daily encouraged. We'll recheck DEXA next year. Keep scheduled follow-up colonoscopy. Reviewed importance of increasing regular exercise and decreasing calories for weight loss. Home safety and fall prevention discussed. UA: Pap normal 2014, new screening guidelines reviewed.    Huel Cote WHNP, 11:10 AM 11/04/2014

## 2014-11-04 NOTE — Patient Instructions (Signed)

## 2014-12-15 ENCOUNTER — Ambulatory Visit (INDEPENDENT_AMBULATORY_CARE_PROVIDER_SITE_OTHER): Payer: PRIVATE HEALTH INSURANCE | Admitting: Gastroenterology

## 2014-12-15 ENCOUNTER — Encounter: Payer: Self-pay | Admitting: Gastroenterology

## 2014-12-15 VITALS — BP 140/92 | HR 88 | Ht 64.0 in | Wt 186.0 lb

## 2014-12-15 DIAGNOSIS — Z86010 Personal history of colonic polyps: Secondary | ICD-10-CM

## 2014-12-15 MED ORDER — NA SULFATE-K SULFATE-MG SULF 17.5-3.13-1.6 GM/177ML PO SOLN: 1.0000 | Freq: Once | ORAL | Status: DC

## 2014-12-15 NOTE — Assessment & Plan Note (Signed)
Plan followup colonoscopy 

## 2014-12-15 NOTE — Progress Notes (Signed)
    _                                                                                                                History of Present Illness:  Ms. Giel is a 54 year old female from [place] with history of colon polyps referred for colonoscopy.  In 1987 she had a large polyp removed area she was told that she needed surveillance.  Notes from a 2003 colonoscopy stated that polyps were removed in 1998.  Colonoscopy in 2003 was normal.  She underwent colonoscopy in 2010 because of limited rectal bleeding.  Distal proctitis was described and she was placed on Canasa suppositories.  She currently has no GI complaints including change of bowel habits or rectal bleeding.   Past Medical History  Diagnosis Date  . Hyperlipemia   . History of colon polyps   . GERD (gastroesophageal reflux disease)   . Proctitis 2010    Colonoscopy--Dr. Penelope Coop    Past Surgical History  Procedure Laterality Date  . Colonoscopy    . Sigmoidoscopy    . Polypectomy     family history includes Lung cancer in her father. There is no history of Colon cancer. Current Outpatient Prescriptions  Medication Sig Dispense Refill  . hyoscyamine (LEVBID) 0.375 MG 12 hr tablet Take 1 tablet (0.375 mg total) by mouth every 12 (twelve) hours as needed for cramping. 60 tablet 0  . mesalamine (CANASA) 1000 MG suppository Place 1,000 mg rectally at bedtime.      Marland Kitchen omeprazole (PRILOSEC) 20 MG capsule Take 20 mg by mouth daily.       No current facility-administered medications for this visit.   Allergies as of 12/15/2014 - Review Complete 12/15/2014  Allergen Reaction Noted  . Penicillins  10/27/2011    reports that she has quit smoking. She has never used smokeless tobacco. She reports that she drinks alcohol. She reports that she does not use illicit drugs.   Review of Systems: Pertinent positive and negative review of systems were noted in the above HPI section. All other review of systems were otherwise  negative.  Vital signs were reviewed in today's medical record Physical Exam: General: Well developed , well nourished, no acute distress Skin: anicteric Head: Normocephalic and atraumatic Eyes:  sclerae anicteric, EOMI Ears: Normal auditory acuity Mouth: No deformity or lesions Neck: Supple, no masses or thyromegaly Lungs: Clear throughout to auscultation Heart: Regular rate and rhythm; no murmurs, rubs or bruits Abdomen: Soft, non tender and non distended. No masses, hepatosplenomegaly or hernias noted. Normal Bowel sounds Rectal:deferred Musculoskeletal: Symmetrical with no gross deformities  Skin: No lesions on visible extremities Pulses:  Normal pulses noted Extremities: No clubbing, cyanosis, edema or deformities noted Neurological: Alert oriented x 4, grossly nonfocal Cervical Nodes:  No significant cervical adenopathy Inguinal Nodes: No significant inguinal adenopathy Psychological:  Alert and cooperative. Normal mood and affect  See Assessment and Plan under Problem List

## 2014-12-15 NOTE — Patient Instructions (Signed)

## 2014-12-30 ENCOUNTER — Encounter: Payer: Self-pay | Admitting: Gastroenterology

## 2015-01-19 ENCOUNTER — Encounter: Payer: Self-pay | Admitting: Gastroenterology

## 2015-01-19 ENCOUNTER — Ambulatory Visit (AMBULATORY_SURGERY_CENTER): Payer: PRIVATE HEALTH INSURANCE | Admitting: Gastroenterology

## 2015-01-19 VITALS — BP 129/78 | HR 67 | Temp 97.5°F | Resp 19 | Ht 64.0 in | Wt 186.0 lb

## 2015-01-19 DIAGNOSIS — Z86010 Personal history of colonic polyps: Secondary | ICD-10-CM

## 2015-01-19 DIAGNOSIS — K5730 Diverticulosis of large intestine without perforation or abscess without bleeding: Secondary | ICD-10-CM

## 2015-01-19 MED ORDER — SODIUM CHLORIDE 0.9 % IV SOLN: 500.0000 mL | INTRAVENOUS | Status: DC

## 2015-01-19 NOTE — Progress Notes (Signed)
Procedure ends, to recovery, report given and VSS. 

## 2015-01-19 NOTE — Op Note (Signed)
[place]  [address] [place], [id number]   COLONOSCOPY PROCEDURE REPORT  PATIENT: Kimberly, Hall  MR#: [id number] BIRTHDATE: 07-04-1961 , 53  yrs. old GENDER: female ENDOSCOPIST: Inda Castle, MD REFERRED BY: PROCEDURE DATE:  01/19/2015 PROCEDURE:   Colonoscopy, diagnostic First Screening Colonoscopy - Avg.  risk and is 50 yrs.  old or older - No.  Prior Negative Screening - Now for repeat screening. Above average risk  History of Adenoma - Now for follow-up colonoscopy & has been > or = to 3 yrs.  Yes hx of adenoma.  Has been 3 or more years since last colonoscopy.  Polyps Removed Today? No.  Recommend repeat exam, <10 yrs? Yes.  High risk (family or personal hx). ASA CLASS:   Class II INDICATIONS:high risk patient with personal history of colonic polyps. MEDICATIONS: Monitored anesthesia care and Propofol 230 mg IV  DESCRIPTION OF PROCEDURE:   After the risks benefits and alternatives of the procedure were thoroughly explained, informed consent was obtained.  The digital rectal exam revealed no abnormalities of the rectum.   The LB [id number]  endoscope was introduced through the anus and advanced to the ileum. No adverse events experienced.   The quality of the prep was excellent using Suprep  The instrument was then slowly withdrawn as the colon was fully examined.      COLON FINDINGS: There was mild diverticulosis noted in the sigmoid colon.   The examination was otherwise normal.  Retroflexed views revealed no abnormalities. The time to cecum=4 minutes 21 seconds. Withdrawal time=6 minutes 44 seconds.  The scope was withdrawn and the procedure completed. COMPLICATIONS: There were no immediate complications.  ENDOSCOPIC IMPRESSION: 1.   Mild diverticulosis was noted in the sigmoid colon 2.   The examination was otherwise normal  RECOMMENDATIONS: Colonoscopy 10 years  eSigned:  Inda Castle, MD 01/19/2015 4:40 PM   cc:  Precious Haws, MD

## 2015-01-19 NOTE — Patient Instructions (Signed)
Diverticulosis seen today, handout given. Repeat colonoscopy in 10 years. Call us with any questions or concerns. Thank you!  YOU HAD AN ENDOSCOPIC PROCEDURE TODAY AT [facility]: Refer to the procedure report that was given to you for any specific questions about what was found during the examination.  If the procedure report does not answer your questions, please call your gastroenterologist to clarify.  If you requested that your care partner not be given the details of your procedure findings, then the procedure report has been included in a sealed envelope for you to review at your convenience later.  YOU SHOULD EXPECT: Some feelings of bloating in the abdomen. Passage of more gas than usual.  Walking can help get rid of the air that was put into your GI tract during the procedure and reduce the bloating. If you had a lower endoscopy (such as a colonoscopy or flexible sigmoidoscopy) you may notice spotting of blood in your stool or on the toilet paper. If you underwent a bowel prep for your procedure, then you may not have a normal bowel movement for a few days.  DIET: Your first meal following the procedure should be a light meal and then it is ok to progress to your normal diet.  A half-sandwich or bowl of soup is an example of a good first meal.  Heavy or fried foods are harder to digest and may make you feel nauseous or bloated.  Likewise meals heavy in dairy and vegetables can cause extra gas to form and this can also increase the bloating.  Drink plenty of fluids but you should avoid alcoholic beverages for 24 hours.  ACTIVITY: Your care partner should take you home directly after the procedure.  You should plan to take it easy, moving slowly for the rest of the day.  You can resume normal activity the day after the procedure however you should NOT DRIVE or use heavy machinery for 24 hours (because of the sedation medicines used during the test).    SYMPTOMS TO REPORT  IMMEDIATELY: A gastroenterologist can be reached at any hour.  During normal business hours, 8:30 AM to 5:00 PM Monday through Friday, call [phone].  After hours and on weekends, please call the GI answering service at [phone] who will take a message and have the physician on call contact you.   Following lower endoscopy (colonoscopy or flexible sigmoidoscopy):  Excessive amounts of blood in the stool  Significant tenderness or worsening of abdominal pains  Swelling of the abdomen that is new, acute  Fever of 100F or higher  Following upper endoscopy (EGD)  Vomiting of blood or coffee ground material  New chest pain or pain under the shoulder blades  Painful or persistently difficult swallowing  New shortness of breath  Fever of 100F or higher  Black, tarry-looking stools  FOLLOW UP: If any biopsies were taken you will be contacted by phone or by letter within the next 1-3 weeks.  Call your gastroenterologist if you have not heard about the biopsies in 3 weeks.  Our staff will call the home number listed on your records the next business day following your procedure to check on you and address any questions or concerns that you may have at that time regarding the information given to you following your procedure. This is a courtesy call and so if there is no answer at the home number and we have not heard from you through the emergency physician on call,  we will assume that you have returned to your regular daily activities without incident.  SIGNATURES/CONFIDENTIALITY: You and/or your care partner have signed paperwork which will be entered into your electronic medical record.  These signatures attest to the fact that that the information above on your After Visit Summary has been reviewed and is understood.  Full responsibility of the confidentiality of this discharge information lies with you and/or your care-partner.

## 2015-01-20 ENCOUNTER — Telehealth: Payer: Self-pay | Admitting: *Deleted

## 2015-01-20 NOTE — Telephone Encounter (Signed)
  Follow up Call-  Call back number 01/19/2015  Post procedure Call Back phone  # [phone]  Permission to leave phone message Yes    [facility]

## 2015-11-02 DIAGNOSIS — Z6830 Body mass index (BMI) 30.0-30.9, adult: Secondary | ICD-10-CM | POA: Insufficient documentation

## 2015-11-30 ENCOUNTER — Ambulatory Visit (INDEPENDENT_AMBULATORY_CARE_PROVIDER_SITE_OTHER): Payer: No Typology Code available for payment source | Admitting: Women's Health

## 2015-11-30 ENCOUNTER — Other Ambulatory Visit (HOSPITAL_COMMUNITY)
Admission: RE | Admit: 2015-11-30 | Discharge: 2015-11-30 | Disposition: A | Discharge status: Home / Self Care | Payer: PRIVATE HEALTH INSURANCE | Source: Ambulatory Visit | Attending: Women's Health | Admitting: Women's Health

## 2015-11-30 ENCOUNTER — Encounter: Payer: Self-pay | Admitting: Women's Health

## 2015-11-30 VITALS — BP 128/80 | Ht 64.0 in | Wt 177.0 lb

## 2015-11-30 DIAGNOSIS — Z1151 Encounter for screening for human papillomavirus (HPV): Secondary | ICD-10-CM | POA: Insufficient documentation

## 2015-11-30 DIAGNOSIS — Z86010 Personal history of colonic polyps: Secondary | ICD-10-CM | POA: Diagnosis not present

## 2015-11-30 DIAGNOSIS — Z01419 Encounter for gynecological examination (general) (routine) without abnormal findings: Secondary | ICD-10-CM | POA: Insufficient documentation

## 2015-11-30 DIAGNOSIS — M8580 Other specified disorders of bone density and structure, unspecified site: Secondary | ICD-10-CM

## 2015-11-30 DIAGNOSIS — M899 Disorder of bone, unspecified: Secondary | ICD-10-CM | POA: Diagnosis not present

## 2015-11-30 DIAGNOSIS — Z13820 Encounter for screening for osteoporosis: Secondary | ICD-10-CM

## 2015-11-30 NOTE — Patient Instructions (Signed)

## 2015-11-30 NOTE — Progress Notes (Signed)
Bryli Ceasar [birth year] BP:[id number]    History:    Presents for annual exam.  Postmenopausal on no HRT with no bleeding. States occasionally will have a spot of blood after intercourse/reports dryness. Normal Pap and mammogram history, negative breast biopsy 2009. Overdue for mammogram. 2010 benign colon polyp, 12/2014 colonoscopy no polyps. 2013 T score -1.1 FRAX 8.8%/0.5%.  Labs at health screening at work reports normal, cholesterol slightly elevated.  Past medical history, past surgical history, family history and social history were all reviewed and documented in the EPIC chart. Works at [employer]. Originally from [place].  ROS:  A ROS was performed and pertinent positives and negatives are included.  Exam:  Filed Vitals:   11/30/15 0833  BP: 128/80    General appearance:  Normal Thyroid:  Symmetrical, normal in size, without palpable masses or nodularity. Respiratory  Auscultation:  Clear without wheezing or rhonchi Cardiovascular  Auscultation:  Regular rate, without rubs, murmurs or gallops  Edema/varicosities:  Not grossly evident Abdominal  Soft,nontender, without masses, guarding or rebound.  Liver/spleen:  No organomegaly noted  Hernia:  None appreciated  Skin  Inspection:  Grossly normal   Breasts: Examined lying and sitting.     Right: Without masses, retractions, discharge or axillary adenopathy.     Left: Without masses, retractions, discharge or axillary adenopathy. Gentitourinary   Inguinal/mons:  Normal without inguinal adenopathy  External genitalia:  Normal  BUS/Urethra/Skene's glands:  Normal  Vagina:  Atrophic Cervix:  Normal  Uterus:   normal in size, shape and contour.  Midline and mobile  Adnexa/parametria:     Rt: Without masses or tenderness.   Lt: Without masses or tenderness.  Anus and perineum: Normal  Digital rectal exam: Normal sphincter tone without palpated masses or tenderness  Assessment/Plan:  55 y.o. M WF G2 P2 for annual exam  with no complaints.  Postmenopausal/no HRT/no bleeding Vaginal atrophy/dryness  Plan: Vaginal lubricants reviewed, declines other treatment. SBE's, continue annual screening mammogram overdue instructed to schedule. Regular exercise, calcium rich diet, vitamin D 2000 daily encouraged. DEXA, will schedule. Reviewed importance of weightbearing exercise. UA, Pap with HR HPV typing, new screening guidelines reviewed.    Huel Cote WHNP, 9:00 AM 11/30/2015

## 2015-11-30 NOTE — Addendum Note (Signed)
Addended by: Burnett Kanaris on: 11/30/2015 09:26 AM   Modules accepted: Orders

## 2015-12-02 LAB — URINALYSIS W MICROSCOPIC + REFLEX CULTURE
Bilirubin Urine: NEGATIVE
CASTS: NONE SEEN [LPF]
CRYSTALS: NONE SEEN [HPF]
Glucose, UA: NEGATIVE
Hgb urine dipstick: NEGATIVE
KETONES UR: NEGATIVE
Leukocytes, UA: NEGATIVE
Nitrite: POSITIVE — AB
Protein, ur: NEGATIVE
SPECIFIC GRAVITY, URINE: 1.014 (ref 1.001–1.035)
WBC, UA: NONE SEEN WBC/HPF (ref <=5)
Yeast: NONE SEEN [HPF]
pH: 7 (ref 5.0–8.0)

## 2015-12-02 LAB — CYTOLOGY - PAP

## 2015-12-03 ENCOUNTER — Other Ambulatory Visit: Payer: Self-pay | Admitting: Women's Health

## 2015-12-03 MED ORDER — SULFAMETHOXAZOLE-TRIMETHOPRIM 800-160 MG PO TABS: 1.0000 | ORAL_TABLET | Freq: Two times a day (BID) | ORAL | Status: DC

## 2015-12-04 LAB — URINE CULTURE

## 2016-01-04 ENCOUNTER — Ambulatory Visit (INDEPENDENT_AMBULATORY_CARE_PROVIDER_SITE_OTHER): Payer: No Typology Code available for payment source

## 2016-01-04 DIAGNOSIS — M899 Disorder of bone, unspecified: Secondary | ICD-10-CM

## 2016-01-04 DIAGNOSIS — Z13820 Encounter for screening for osteoporosis: Secondary | ICD-10-CM | POA: Diagnosis not present

## 2016-01-04 DIAGNOSIS — M8580 Other specified disorders of bone density and structure, unspecified site: Secondary | ICD-10-CM

## 2016-06-01 ENCOUNTER — Ambulatory Visit (INDEPENDENT_AMBULATORY_CARE_PROVIDER_SITE_OTHER): Payer: PRIVATE HEALTH INSURANCE | Admitting: Gastroenterology

## 2016-06-01 ENCOUNTER — Encounter: Payer: Self-pay | Admitting: Gastroenterology

## 2016-06-01 VITALS — BP 134/78 | HR 80 | Ht 64.0 in | Wt 176.2 lb

## 2016-06-01 DIAGNOSIS — K5730 Diverticulosis of large intestine without perforation or abscess without bleeding: Secondary | ICD-10-CM | POA: Diagnosis not present

## 2016-06-01 DIAGNOSIS — R194 Change in bowel habit: Secondary | ICD-10-CM

## 2016-06-01 DIAGNOSIS — R1032 Left lower quadrant pain: Secondary | ICD-10-CM | POA: Diagnosis not present

## 2016-06-01 DIAGNOSIS — K625 Hemorrhage of anus and rectum: Secondary | ICD-10-CM

## 2016-06-01 DIAGNOSIS — R198 Other specified symptoms and signs involving the digestive system and abdomen: Secondary | ICD-10-CM

## 2016-06-01 MED ORDER — HYDROCORTISONE ACETATE 25 MG RE SUPP: 25.0000 mg | Freq: Two times a day (BID) | RECTAL | Status: DC | PRN

## 2016-06-01 NOTE — Patient Instructions (Addendum)
If you are age 56 or older, your body mass index should be between 23-30. Your Body mass index is 30.24 kg/(m^2). If this is out of the aforementioned range listed, please consider follow up with your Primary Care Provider.  If you are age 69 or younger, your body mass index should be between 19-25. Your Body mass index is 30.24 kg/(m^2). If this is out of the aformentioned range listed, please consider follow up with your Primary Care Provider.   We have sent the following medications to your pharmacy for you to pick up at your convenience: Anusol [facility]  Please start. Stool softener (ducosate sodium) 100 mg twice daily Available over the counter.  Thank you for choosing [facility]  Dr Wilfrid Lund III

## 2016-06-01 NOTE — Progress Notes (Signed)
[facility] Progress Note  Chief Complaint: Change in bowel habits and rectal bleeding.  Subjective History:  This is a 55 year old woman last seen by Dr. Deatra Ina for colonoscopy in February 2016. That exam was being done for history of colon polyps. Sigmoid diverticulosis was discovered, the exam was otherwise reportedly normal. Retroflexion photo does not show hemorrhoids. Dr. Deatra Ina also saw her in 2012 with a history of proctitis on a colonoscopy done in [place] 2010. At that time, she apparently was having some bleeding and she was treated with a short course of Canasa which resolved the symptoms. He also prescribed some hiatal max, she cannot recall if she took it or whether it helped anything. She now has about 2 months of an occasional streak of blood on the stool, but over the last 7 days has some bright red blood per rectum with thin stools and some intermittent lower abdominal pressure. She has 3 or 4 small thin stools per day and does not feel completely evacuated. Her appetite is been good and her weight stable.  ROS: Cardiovascular:  no chest pain Respiratory: no dyspnea  The patient's Past Medical, Family and Social History were reviewed and are on file in the EMR.  Objective:  Med list reviewed  Vital signs in last 24 hrs: Filed Vitals:   06/01/16 1033  BP: 134/78  Pulse: 80    Physical Exam Exam chaperoned by our MA Toni  HEENT: sclera anicteric, oral mucosa moist without lesions  Neck: supple, no thyromegaly, JVD or lymphadenopathy  Cardiac: RRR without murmurs, S1S2 heard, no peripheral edema  Pulm: clear to auscultation bilaterally, normal RR and effort noted  Abdomen: soft, No tenderness, with active bowel sounds. No guarding or palpable hepatosplenomegaly.  Skin; warm and dry, no jaundice or rash Rectal: Normal sphincter tone, no fissure, no palpable internal lesions   @ASSESSMENTPLANBEGIN @ Assessment: Encounter Diagnoses  Name Primary?  Marland Kitchen LLQ  abdominal pain Yes  . Change in bowel function   . Rectal bleeding   . Diverticulosis of colon without hemorrhage    I suspect the diverticulosis has caused some luminal narrowing resulting in change of bowel character with some associated benign anorectal bleeding. This does not seem like proctitis since there is no urgency or diarrhea. Malignancy seems very unlikely with a colonoscopy less than 18 months ago.   Plan: Colace 100 twice daily indefinitely anusol HC suppository twice daily x 7-10 days  Call us with an update in 3 weeks.   Kimberly Hall

## 2016-06-06 ENCOUNTER — Encounter: Payer: Self-pay | Admitting: Internal Medicine

## 2016-06-07 ENCOUNTER — Telehealth: Payer: Self-pay | Admitting: Gastroenterology

## 2016-06-07 NOTE — Telephone Encounter (Signed)
Patient can't afford Anusol suppositories.  Please advise alternative

## 2016-06-07 NOTE — Telephone Encounter (Signed)
Left message for patient to call back  

## 2016-06-08 MED ORDER — HYDROCORTISONE 2.5 % RE CREA: 1.0000 "application " | TOPICAL_CREAM | Freq: Two times a day (BID) | RECTAL | Status: DC

## 2016-06-08 NOTE — Telephone Encounter (Signed)
If they are not available over the counter, then use hydrocortisone 2.5% cream and put it on a glycerin suppository twice daily

## 2016-06-08 NOTE — Telephone Encounter (Signed)
Left message for patient to call back  

## 2016-06-09 NOTE — Telephone Encounter (Signed)
Left message for patient to call back  

## 2016-06-10 NOTE — Telephone Encounter (Signed)
Patient notified of the recommendations.  

## 2016-06-10 NOTE — Telephone Encounter (Signed)
Left message for patient to call back  

## 2016-09-01 ENCOUNTER — Other Ambulatory Visit: Payer: Self-pay | Admitting: Family Medicine

## 2016-09-02 ENCOUNTER — Other Ambulatory Visit: Payer: Self-pay | Admitting: Women's Health

## 2016-09-02 DIAGNOSIS — Z1231 Encounter for screening mammogram for malignant neoplasm of breast: Secondary | ICD-10-CM

## 2016-09-02 LAB — CMP12+LP+TP+TSH+6AC+CBC/D/PLT
ALT: 20 IU/L (ref 0–32)
AST: 23 IU/L (ref 0–40)
Albumin/Globulin Ratio: 1.7 (ref 1.2–2.2)
Albumin: 4.5 g/dL (ref 3.5–5.5)
Alkaline Phosphatase: 63 IU/L (ref 39–117)
BUN / CREAT RATIO: 20 (ref 9–23)
BUN: 12 mg/dL (ref 6–24)
Basophils Absolute: 0 10*3/uL (ref 0.0–0.2)
Basos: 0 %
Bilirubin Total: 0.4 mg/dL (ref 0.0–1.2)
CALCIUM: 9 mg/dL (ref 8.7–10.2)
CHLORIDE: 100 mmol/L (ref 96–106)
CHOL/HDL RATIO: 2.4 ratio (ref 0.0–4.4)
CREATININE: 0.61 mg/dL (ref 0.57–1.00)
Cholesterol, Total: 245 mg/dL — ABNORMAL HIGH (ref 100–199)
EOS (ABSOLUTE): 0.1 10*3/uL (ref 0.0–0.4)
Eos: 2 %
Free Thyroxine Index: 1.7 (ref 1.2–4.9)
GFR, EST AFRICAN AMERICAN: 117 mL/min/{1.73_m2} (ref >59)
GFR, EST NON AFRICAN AMERICAN: 102 mL/min/{1.73_m2} (ref >59)
GGT: 14 IU/L (ref 0–60)
GLUCOSE: 86 mg/dL (ref 65–99)
Globulin, Total: 2.7 g/dL (ref 1.5–4.5)
HDL: 102 mg/dL (ref >39)
Hematocrit: 38.5 % (ref 34.0–46.6)
Hemoglobin: 12.6 g/dL (ref 11.1–15.9)
Immature Grans (Abs): 0 10*3/uL (ref 0.0–0.1)
Immature Granulocytes: 0 %
Iron: 86 ug/dL (ref 27–159)
LDH: 214 IU/L (ref 119–226)
LDL Calculated: 124 mg/dL — ABNORMAL HIGH (ref 0–99)
Lymphocytes Absolute: 1.8 10*3/uL (ref 0.7–3.1)
Lymphs: 35 %
MCH: 30.1 pg (ref 26.6–33.0)
MCHC: 32.7 g/dL (ref 31.5–35.7)
MCV: 92 fL (ref 79–97)
MONOCYTES: 6 %
Monocytes Absolute: 0.3 10*3/uL (ref 0.1–0.9)
NEUTROS ABS: 2.9 10*3/uL (ref 1.4–7.0)
Neutrophils: 57 %
PHOSPHORUS: 3.5 mg/dL (ref 2.5–4.5)
POTASSIUM: 4.1 mmol/L (ref 3.5–5.2)
Platelets: 218 10*3/uL (ref 150–379)
RBC: 4.19 x10E6/uL (ref 3.77–5.28)
RDW: 14.7 % (ref 12.3–15.4)
Sodium: 138 mmol/L (ref 134–144)
T3 Uptake Ratio: 24 % (ref 24–39)
T4 TOTAL: 7.1 ug/dL (ref 4.5–12.0)
TOTAL PROTEIN: 7.2 g/dL (ref 6.0–8.5)
TSH: 3.09 u[IU]/mL (ref 0.450–4.500)
Triglycerides: 93 mg/dL (ref 0–149)
URIC ACID: 3.6 mg/dL (ref 2.5–7.1)
VLDL Cholesterol Cal: 19 mg/dL (ref 5–40)
WBC: 5.1 10*3/uL (ref 3.4–10.8)

## 2016-09-02 LAB — HGB A1C W/O EAG: HEMOGLOBIN A1C: 5.1 % (ref 4.8–5.6)

## 2016-09-19 ENCOUNTER — Ambulatory Visit
Admission: RE | Admit: 2016-09-19 | Discharge: 2016-09-19 | Disposition: A | Discharge status: Home / Self Care | Payer: PRIVATE HEALTH INSURANCE | Source: Ambulatory Visit | Attending: Women's Health | Admitting: Women's Health

## 2016-09-19 DIAGNOSIS — Z1231 Encounter for screening mammogram for malignant neoplasm of breast: Secondary | ICD-10-CM | POA: Diagnosis present

## 2017-01-25 ENCOUNTER — Ambulatory Visit (INDEPENDENT_AMBULATORY_CARE_PROVIDER_SITE_OTHER): Payer: No Typology Code available for payment source | Admitting: Women's Health

## 2017-01-25 ENCOUNTER — Encounter: Payer: Self-pay | Admitting: Women's Health

## 2017-01-25 VITALS — BP 130/80 | Ht 64.5 in | Wt 184.0 lb

## 2017-01-25 DIAGNOSIS — Z01419 Encounter for gynecological examination (general) (routine) without abnormal findings: Secondary | ICD-10-CM

## 2017-01-25 LAB — URINALYSIS W MICROSCOPIC + REFLEX CULTURE
BILIRUBIN URINE: NEGATIVE
Bacteria, UA: NONE SEEN [HPF]
Casts: NONE SEEN [LPF]
Crystals: NONE SEEN [HPF]
GLUCOSE, UA: NEGATIVE
Hgb urine dipstick: NEGATIVE
Ketones, ur: NEGATIVE
Nitrite: NEGATIVE
PH: 7 (ref 5.0–8.0)
Protein, ur: NEGATIVE
RBC / HPF: NONE SEEN RBC/HPF (ref <=2)
SPECIFIC GRAVITY, URINE: 1.006 (ref 1.001–1.035)
WBC UA: NONE SEEN WBC/HPF (ref <=5)
Yeast: NONE SEEN [HPF]

## 2017-01-25 NOTE — Patient Instructions (Signed)
Health Maintenance for Postmenopausal Women Menopause is a normal process in which your reproductive ability comes to an end. This process happens gradually over a span of months to years, usually between the ages of 33 and 38. Menopause is complete when you have missed 12 consecutive menstrual periods. It is important to talk with your health care provider about some of the most common conditions that affect postmenopausal women, such as heart disease, cancer, and bone loss (osteoporosis). Adopting a healthy lifestyle and getting preventive care can help to promote your health and wellness. Those actions can also lower your chances of developing some of these common conditions. What should I know about menopause? During menopause, you may experience a number of symptoms, such as:  Moderate-to-severe hot flashes.  Night sweats.  Decrease in sex drive.  Mood swings.  Headaches.  Tiredness.  Irritability.  Memory problems.  Insomnia. Choosing to treat or not to treat menopausal changes is an individual decision that you make with your health care provider. What should I know about hormone replacement therapy and supplements? Hormone therapy products are effective for treating symptoms that are associated with menopause, such as hot flashes and night sweats. Hormone replacement carries certain risks, especially as you become older. If you are thinking about using estrogen or estrogen with progestin treatments, discuss the benefits and risks with your health care provider. What should I know about heart disease and stroke? Heart disease, heart attack, and stroke become more likely as you age. This may be due, in part, to the hormonal changes that your body experiences during menopause. These can affect how your body processes dietary fats, triglycerides, and cholesterol. Heart attack and stroke are both medical emergencies. There are many things that you can do to help prevent heart disease  and stroke:  Have your blood pressure checked at least every 1-2 years. High blood pressure causes heart disease and increases the risk of stroke.  If you are 48-61 years old, ask your health care provider if you should take aspirin to prevent a heart attack or a stroke.  Do not use any tobacco products, including cigarettes, chewing tobacco, or electronic cigarettes. If you need help quitting, ask your health care provider.  It is important to eat a healthy diet and maintain a healthy weight.  Be sure to include plenty of vegetables, fruits, low-fat dairy products, and lean protein.  Avoid eating foods that are high in solid fats, added sugars, or salt (sodium).  Get regular exercise. This is one of the most important things that you can do for your health.  Try to exercise for at least 150 minutes each week. The type of exercise that you do should increase your heart rate and make you sweat. This is known as moderate-intensity exercise.  Try to do strengthening exercises at least twice each week. Do these in addition to the moderate-intensity exercise.  Know your numbers.Ask your health care provider to check your cholesterol and your blood glucose. Continue to have your blood tested as directed by your health care provider. What should I know about cancer screening? There are several types of cancer. Take the following steps to reduce your risk and to catch any cancer development as early as possible. Breast Cancer  Practice breast self-awareness.  This means understanding how your breasts normally appear and feel.  It also means doing regular breast self-exams. Let your health care provider know about any changes, no matter how small.  If you are 40 or older,  have a clinician do a breast exam (clinical breast exam or CBE) every year. Depending on your age, family history, and medical history, it may be recommended that you also have a yearly breast X-ray (mammogram).  If you  have a family history of breast cancer, talk with your health care provider about genetic screening.  If you are at high risk for breast cancer, talk with your health care provider about having an MRI and a mammogram every year.  Breast cancer (BRCA) gene test is recommended for women who have family members with BRCA-related cancers. Results of the assessment will determine the need for genetic counseling and BRCA1 and for BRCA2 testing. BRCA-related cancers include these types:  Breast. This occurs in males or females.  Ovarian.  Tubal. This may also be called fallopian tube cancer.  Cancer of the abdominal or pelvic lining (peritoneal cancer).  Prostate.  Pancreatic. Cervical, Uterine, and Ovarian Cancer  Your health care provider may recommend that you be screened regularly for cancer of the pelvic organs. These include your ovaries, uterus, and vagina. This screening involves a pelvic exam, which includes checking for microscopic changes to the surface of your cervix (Pap test).  For women ages 21-65, health care providers may recommend a pelvic exam and a Pap test every three years. For women ages 23-65, they may recommend the Pap test and pelvic exam, combined with testing for human papilloma virus (HPV), every five years. Some types of HPV increase your risk of cervical cancer. Testing for HPV may also be done on women of any age who have unclear Pap test results.  Other health care providers may not recommend any screening for nonpregnant women who are considered low risk for pelvic cancer and have no symptoms. Ask your health care provider if a screening pelvic exam is right for you.  If you have had past treatment for cervical cancer or a condition that could lead to cancer, you need Pap tests and screening for cancer for at least 20 years after your treatment. If Pap tests have been discontinued for you, your risk factors (such as having a new sexual partner) need to be reassessed  to determine if you should start having screenings again. Some women have medical problems that increase the chance of getting cervical cancer. In these cases, your health care provider may recommend that you have screening and Pap tests more often.  If you have a family history of uterine cancer or ovarian cancer, talk with your health care provider about genetic screening.  If you have vaginal bleeding after reaching menopause, tell your health care provider.  There are currently no reliable tests available to screen for ovarian cancer. Lung Cancer  Lung cancer screening is recommended for adults 99-83 years old who are at high risk for lung cancer because of a history of smoking. A yearly low-dose CT scan of the lungs is recommended if you:  Currently smoke.  Have a history of at least 30 pack-years of smoking and you currently smoke or have quit within the past 15 years. A pack-year is smoking an average of one pack of cigarettes per day for one year. Yearly screening should:  Continue until it has been 15 years since you quit.  Stop if you develop a health problem that would prevent you from having lung cancer treatment. Colorectal Cancer  This type of cancer can be detected and can often be prevented.  Routine colorectal cancer screening usually begins at age 72 and continues  through age 75.  If you have risk factors for colon cancer, your health care provider may recommend that you be screened at an earlier age.  If you have a family history of colorectal cancer, talk with your health care provider about genetic screening.  Your health care provider may also recommend using home test kits to check for hidden blood in your stool.  A small camera at the end of a tube can be used to examine your colon directly (sigmoidoscopy or colonoscopy). This is done to check for the earliest forms of colorectal cancer.  Direct examination of the colon should be repeated every 5-10 years until  age 75. However, if early forms of precancerous polyps or small growths are found or if you have a family history or genetic risk for colorectal cancer, you may need to be screened more often. Skin Cancer  Check your skin from head to toe regularly.  Monitor any moles. Be sure to tell your health care provider:  About any new moles or changes in moles, especially if there is a change in a mole's shape or color.  If you have a mole that is larger than the size of a pencil eraser.  If any of your family members has a history of skin cancer, especially at a young age, talk with your health care provider about genetic screening.  Always use sunscreen. Apply sunscreen liberally and repeatedly throughout the day.  Whenever you are outside, protect yourself by wearing long sleeves, pants, a wide-brimmed hat, and sunglasses. What should I know about osteoporosis? Osteoporosis is a condition in which bone destruction happens more quickly than new bone creation. After menopause, you may be at an increased risk for osteoporosis. To help prevent osteoporosis or the bone fractures that can happen because of osteoporosis, the following is recommended:  If you are 19-50 years old, get at least 1,000 mg of calcium and at least 600 mg of vitamin D per day.  If you are older than age 50 but younger than age 70, get at least 1,200 mg of calcium and at least 600 mg of vitamin D per day.  If you are older than age 70, get at least 1,200 mg of calcium and at least 800 mg of vitamin D per day. Smoking and excessive alcohol intake increase the risk of osteoporosis. Eat foods that are rich in calcium and vitamin D, and do weight-bearing exercises several times each week as directed by your health care provider. What should I know about how menopause affects my mental health? Depression may occur at any age, but it is more common as you become older. Common symptoms of depression include:  Low or sad  mood.  Changes in sleep patterns.  Changes in appetite or eating patterns.  Feeling an overall lack of motivation or enjoyment of activities that you previously enjoyed.  Frequent crying spells. Talk with your health care provider if you think that you are experiencing depression. What should I know about immunizations? It is important that you get and maintain your immunizations. These include:  Tetanus, diphtheria, and pertussis (Tdap) booster vaccine.  Influenza every year before the flu season begins.  Pneumonia vaccine.  Shingles vaccine. Your health care provider may also recommend other immunizations. This information is not intended to replace advice given to you by your health care provider. Make sure you discuss any questions you have with your health care provider. Document Released: 01/06/2006 Document Revised: 06/03/2016 Document Reviewed: 08/18/2015 Elsevier Interactive Patient   Education  2017 [employer]  

## 2017-01-25 NOTE — Progress Notes (Signed)
Kimberly Hall May 28, 1961 [id number]    History:    Presents for annual exam.  Postmenopausal on no HRT with no bleeding. Normal Pap and mammogram history. 2016 negative colonoscopy with mild diverticuli. Labs at health screenings at work. 2017 DEXA score -1.2 at femoral neck FRAX 5.5%/0.3%  osteopenia stable.  Past medical history, past surgical history, family history and social history were all reviewed and documented in the EPIC chart. Works at [employer]. Originally from [place]. 2 children both doing well.  ROS:  A ROS was performed and pertinent positives and negatives are included.  Exam:  Vitals:   01/25/17 0851  BP: 130/80  Weight: 184 lb (83.5 kg)  Height: 5' 4.5" (1.638 m)   Body mass index is 31.1 kg/m.   General appearance:  Normal Thyroid:  Symmetrical, normal in size, without palpable masses or nodularity. Respiratory  Auscultation:  Clear without wheezing or rhonchi Cardiovascular  Auscultation:  Regular rate, without rubs, murmurs or gallops  Edema/varicosities:  Not grossly evident Abdominal  Soft,nontender, without masses, guarding or rebound.  Liver/spleen:  No organomegaly noted  Hernia:  None appreciated  Skin  Inspection:  Grossly normal   Breasts: Examined lying and sitting.     Right: Without masses, retractions, discharge or axillary adenopathy.     Left: Without masses, retractions, discharge or axillary adenopathy. Gentitourinary   Inguinal/mons:  Normal without inguinal adenopathy  External genitalia:  Normal  BUS/Urethra/Skene's glands:  Normal  Vagina:  Normal  Cervix:  Normal  Uterus:   normal in size, shape and contour.  Midline and mobile  Adnexa/parametria:     Rt: Without masses or tenderness.   Lt: Without masses or tenderness.  Anus and perineum: Normal  Digital rectal exam: Normal sphincter tone without palpated masses or tenderness  Assessment/Plan:  56 y.o. MWF G6 P2 for annual exam with no  complaints.  Postmenopausal/no HRT/no bleeding 2016 mild diverticuli noted on colonoscopy GERD Obesity Labs-health screenings at work  Plan: SBE's, continue annual screening mammogram, calcium rich diet, vitamin D 2000 daily encouraged. Home safety, fall prevention and importance of weightbearing exercise reviewed. Instructed to decrease snacks, decrease calories for weight loss encouraged. Pap normal with negative HR HPV 2017, new screening guidelines reviewed.  [facility], 9:14 AM 01/25/2017

## 2017-01-26 LAB — URINE CULTURE: Organism ID, Bacteria: NO GROWTH

## 2017-07-10 ENCOUNTER — Ambulatory Visit: Payer: Self-pay | Admitting: *Deleted

## 2017-07-10 VITALS — BP 128/84 | HR 76 | Ht 64.0 in | Wt 186.0 lb

## 2017-07-10 DIAGNOSIS — Z0000 Encounter for general adult medical examination without abnormal findings: Secondary | ICD-10-CM

## 2017-07-10 NOTE — Progress Notes (Signed)
[employer] insurance premium discount evaluation: Labs Drawn. [employer] ROI form signed. Tobacco Free Attestation form signed.  Forms placed in paper chart. Okay to route results to pcp per pt. 

## 2017-07-11 LAB — CMP12+LP+TP+TSH+6AC+CBC/D/PLT
A/G RATIO: 1.9 (ref 1.2–2.2)
ALBUMIN: 4.5 g/dL (ref 3.5–5.5)
ALT: 15 IU/L (ref 0–32)
AST: 23 IU/L (ref 0–40)
Alkaline Phosphatase: 72 IU/L (ref 39–117)
BILIRUBIN TOTAL: 0.3 mg/dL (ref 0.0–1.2)
BUN / CREAT RATIO: 24 — AB (ref 9–23)
BUN: 17 mg/dL (ref 6–24)
Basophils Absolute: 0 10*3/uL (ref 0.0–0.2)
Basos: 0 %
CHOLESTEROL TOTAL: 221 mg/dL — AB (ref 100–199)
Calcium: 9 mg/dL (ref 8.7–10.2)
Chloride: 102 mmol/L (ref 96–106)
Chol/HDL Ratio: 2.7 ratio (ref 0.0–4.4)
Creatinine, Ser: 0.71 mg/dL (ref 0.57–1.00)
EOS (ABSOLUTE): 0.1 10*3/uL (ref 0.0–0.4)
Eos: 2 %
Estimated CHD Risk: <0.5 times avg. (ref 0.0–1.0)
FREE THYROXINE INDEX: 1.7 (ref 1.2–4.9)
GFR calc Af Amer: 110 mL/min/{1.73_m2} (ref >59)
GFR calc non Af Amer: 96 mL/min/{1.73_m2} (ref >59)
GGT: 15 IU/L (ref 0–60)
GLUCOSE: 93 mg/dL (ref 65–99)
Globulin, Total: 2.4 g/dL (ref 1.5–4.5)
HDL: 81 mg/dL (ref >39)
HEMOGLOBIN: 12.8 g/dL (ref 11.1–15.9)
Hematocrit: 39.4 % (ref 34.0–46.6)
IMMATURE GRANS (ABS): 0 10*3/uL (ref 0.0–0.1)
IMMATURE GRANULOCYTES: 0 %
IRON: 113 ug/dL (ref 27–159)
LDH: 218 IU/L (ref 119–226)
LDL Calculated: 117 mg/dL — ABNORMAL HIGH (ref 0–99)
LYMPHS ABS: 1.5 10*3/uL (ref 0.7–3.1)
Lymphs: 31 %
MCH: 29.8 pg (ref 26.6–33.0)
MCHC: 32.5 g/dL (ref 31.5–35.7)
MCV: 92 fL (ref 79–97)
MONOCYTES: 7 %
Monocytes Absolute: 0.4 10*3/uL (ref 0.1–0.9)
NEUTROS PCT: 60 %
Neutrophils Absolute: 2.9 10*3/uL (ref 1.4–7.0)
PLATELETS: 220 10*3/uL (ref 150–379)
Phosphorus: 3 mg/dL (ref 2.5–4.5)
Potassium: 4.2 mmol/L (ref 3.5–5.2)
RBC: 4.29 x10E6/uL (ref 3.77–5.28)
RDW: 13.9 % (ref 12.3–15.4)
SODIUM: 139 mmol/L (ref 134–144)
T3 UPTAKE RATIO: 23 % — AB (ref 24–39)
T4, Total: 7.2 ug/dL (ref 4.5–12.0)
TOTAL PROTEIN: 6.9 g/dL (ref 6.0–8.5)
TRIGLYCERIDES: 115 mg/dL (ref 0–149)
TSH: 3.64 u[IU]/mL (ref 0.450–4.500)
Uric Acid: 4 mg/dL (ref 2.5–7.1)
VLDL CHOLESTEROL CAL: 23 mg/dL (ref 5–40)
WBC: 4.8 10*3/uL (ref 3.4–10.8)

## 2017-07-11 LAB — HGB A1C W/O EAG: Hgb A1c MFr Bld: 5.3 % (ref 4.8–5.6)

## 2017-07-11 NOTE — Progress Notes (Signed)
Results reviewed with pt. Avoid dehydration r/t BUN/Cr ratio. Total chol & LDL elevated but improved from previous. Other labs unremarkable. Diet and exercise recommendations discussed. Copy provided to pt. Results routed to pcp per pt request. No further questions/concerns.

## 2017-09-25 ENCOUNTER — Other Ambulatory Visit: Payer: Self-pay | Admitting: Family

## 2017-09-25 DIAGNOSIS — Z1231 Encounter for screening mammogram for malignant neoplasm of breast: Secondary | ICD-10-CM

## 2017-09-28 ENCOUNTER — Ambulatory Visit
Admission: RE | Admit: 2017-09-28 | Discharge: 2017-09-28 | Disposition: A | Discharge status: Home / Self Care | Payer: PRIVATE HEALTH INSURANCE | Source: Ambulatory Visit | Attending: Family | Admitting: Family

## 2017-09-28 DIAGNOSIS — Z1231 Encounter for screening mammogram for malignant neoplasm of breast: Secondary | ICD-10-CM | POA: Insufficient documentation

## 2017-10-09 DIAGNOSIS — E785 Hyperlipidemia, unspecified: Secondary | ICD-10-CM | POA: Insufficient documentation

## 2018-01-29 ENCOUNTER — Encounter: Payer: Self-pay | Admitting: Women's Health

## 2018-01-29 ENCOUNTER — Ambulatory Visit (INDEPENDENT_AMBULATORY_CARE_PROVIDER_SITE_OTHER): Payer: No Typology Code available for payment source | Admitting: Women's Health

## 2018-01-29 VITALS — BP 122/82 | Ht 64.0 in | Wt 194.0 lb

## 2018-01-29 DIAGNOSIS — Z13820 Encounter for screening for osteoporosis: Secondary | ICD-10-CM | POA: Diagnosis not present

## 2018-01-29 DIAGNOSIS — Z01419 Encounter for gynecological examination (general) (routine) without abnormal findings: Secondary | ICD-10-CM

## 2018-01-29 NOTE — Progress Notes (Signed)
Kimberly Hall 1961/01/28 [id number]    History:    Presents for annual exam.  Postmenopausal, no HRT with no bleeding. Normal Pap and mammogram history. Labs health screening at work, reports as normal. 2017 DEXA -1.2 at femoral neck other areas all normal. FRAX 5.5%/0.3%. 2016 negative polyps on colonoscopy, diverticuli noted.  Past medical history, past surgical history, family history and social history were all reviewed and documented in the EPIC chart. Works at [employer]. 2 children both doing well. Originally from [place].  ROS:  A ROS was performed and pertinent positives and negatives are included.  Exam:  Vitals:   01/29/18 1212  BP: 122/82  Weight: 194 lb (88 kg)  Height: 5\' 4"  (1.626 m)   Body mass index is 33.3 kg/m.   General appearance:  Normal Thyroid:  Symmetrical, normal in size, without palpable masses or nodularity. Respiratory  Auscultation:  Clear without wheezing or rhonchi Cardiovascular  Auscultation:  Regular rate, without rubs, murmurs or gallops  Edema/varicosities:  Not grossly evident Abdominal  Soft,nontender, without masses, guarding or rebound.  Liver/spleen:  No organomegaly noted  Hernia:  None appreciated  Skin  Inspection:  Grossly normal   Breasts: Examined lying and sitting.     Right: Without masses, retractions, discharge or axillary adenopathy.     Left: Without masses, retractions, discharge or axillary adenopathy. Gentitourinary   Inguinal/mons:  Normal without inguinal adenopathy  External genitalia:  Normal  BUS/Urethra/Skene's glands:  Normal  Vagina:  Normal  Cervix:  Normal  Uterus:  normal in size, shape and contour.  Midline and mobile  Adnexa/parametria:     Rt: Without masses or tenderness.   Lt: Without masses or tenderness.  Anus and perineum: Normal  Digital rectal exam: Normal sphincter tone without palpated masses or tenderness  Assessment/Plan:  57 y.o.  MWF G6 P2 annual exam with complaints of right  heel pain, mostly in the a.m.  Postmenopausal/no HRT/no bleeding Obesity Probable plantar fasciitis Osteopenia without elevated FRAX Labs health screening at work  Plan: SBE's, continue annual screening mammogram, calcium rich diet, vitamin D 2000 daily encouraged. Reviewed importance of increasing regular balance type exercise, yoga encouraged. Repeat DEXA at her convenience, DEXA stable. Plantar fasciitis exercise, shoe  inserts reviewed and encouraged. Follow-up with primary care if no relief. Reviewed importance of decreasing calories/carbs for weight loss, gained 10 pounds in the past year. Pap normal 2017, new screening guidelines reviewed.    [place], 1:00 PM 01/29/2018

## 2018-01-29 NOTE — Patient Instructions (Addendum)
Vit d 2000 iu daily shingrex vaccine   Health Maintenance for Postmenopausal Women Menopause is a normal process in which your reproductive ability comes to an end. This process happens gradually over a span of months to years, usually between the ages of 39 and 30. Menopause is complete when you have missed 12 consecutive menstrual periods. It is important to talk with your health care provider about some of the most common conditions that affect postmenopausal women, such as heart disease, cancer, and bone loss (osteoporosis). Adopting a healthy lifestyle and getting preventive care can help to promote your health and wellness. Those actions can also lower your chances of developing some of these common conditions. What should I know about menopause? During menopause, you may experience a number of symptoms, such as:  Moderate-to-severe hot flashes.  Night sweats.  Decrease in sex drive.  Mood swings.  Headaches.  Tiredness.  Irritability.  Memory problems.  Insomnia.  Choosing to treat or not to treat menopausal changes is an individual decision that you make with your health care provider. What should I know about hormone replacement therapy and supplements? Hormone therapy products are effective for treating symptoms that are associated with menopause, such as hot flashes and night sweats. Hormone replacement carries certain risks, especially as you become older. If you are thinking about using estrogen or estrogen with progestin treatments, discuss the benefits and risks with your health care provider. What should I know about heart disease and stroke? Heart disease, heart attack, and stroke become more likely as you age. This may be due, in part, to the hormonal changes that your body experiences during menopause. These can affect how your body processes dietary fats, triglycerides, and cholesterol. Heart attack and stroke are both medical emergencies. There are many things  that you can do to help prevent heart disease and stroke:  Have your blood pressure checked at least every 1-2 years. High blood pressure causes heart disease and increases the risk of stroke.  If you are 58-40 years old, ask your health care provider if you should take aspirin to prevent a heart attack or a stroke.  Do not use any tobacco products, including cigarettes, chewing tobacco, or electronic cigarettes. If you need help quitting, ask your health care provider.  It is important to eat a healthy diet and maintain a healthy weight. ? Be sure to include plenty of vegetables, fruits, low-fat dairy products, and lean protein. ? Avoid eating foods that are high in solid fats, added sugars, or salt (sodium).  Get regular exercise. This is one of the most important things that you can do for your health. ? Try to exercise for at least 150 minutes each week. The type of exercise that you do should increase your heart rate and make you sweat. This is known as moderate-intensity exercise. ? Try to do strengthening exercises at least twice each week. Do these in addition to the moderate-intensity exercise.  Know your numbers.Ask your health care provider to check your cholesterol and your blood glucose. Continue to have your blood tested as directed by your health care provider.  What should I know about cancer screening? There are several types of cancer. Take the following steps to reduce your risk and to catch any cancer development as early as possible. Breast Cancer  Practice breast self-awareness. ? This means understanding how your breasts normally appear and feel. ? It also means doing regular breast self-exams. Let your health care provider know about any changes,  no matter how small.  If you are 73 or older, have a clinician do a breast exam (clinical breast exam or CBE) every year. Depending on your age, family history, and medical history, it may be recommended that you also have  a yearly breast X-ray (mammogram).  If you have a family history of breast cancer, talk with your health care provider about genetic screening.  If you are at high risk for breast cancer, talk with your health care provider about having an MRI and a mammogram every year.  Breast cancer (BRCA) gene test is recommended for women who have family members with BRCA-related cancers. Results of the assessment will determine the need for genetic counseling and BRCA1 and for BRCA2 testing. BRCA-related cancers include these types: ? Breast. This occurs in males or females. ? Ovarian. ? Tubal. This may also be called fallopian tube cancer. ? Cancer of the abdominal or pelvic lining (peritoneal cancer). ? Prostate. ? Pancreatic.  Cervical, Uterine, and Ovarian Cancer Your health care provider may recommend that you be screened regularly for cancer of the pelvic organs. These include your ovaries, uterus, and vagina. This screening involves a pelvic exam, which includes checking for microscopic changes to the surface of your cervix (Pap test).  For women ages 21-65, health care providers may recommend a pelvic exam and a Pap test every three years. For women ages 81-65, they may recommend the Pap test and pelvic exam, combined with testing for human papilloma virus (HPV), every five years. Some types of HPV increase your risk of cervical cancer. Testing for HPV may also be done on women of any age who have unclear Pap test results.  Other health care providers may not recommend any screening for nonpregnant women who are considered low risk for pelvic cancer and have no symptoms. Ask your health care provider if a screening pelvic exam is right for you.  If you have had past treatment for cervical cancer or a condition that could lead to cancer, you need Pap tests and screening for cancer for at least 20 years after your treatment. If Pap tests have been discontinued for you, your risk factors (such as  having a new sexual partner) need to be reassessed to determine if you should start having screenings again. Some women have medical problems that increase the chance of getting cervical cancer. In these cases, your health care provider may recommend that you have screening and Pap tests more often.  If you have a family history of uterine cancer or ovarian cancer, talk with your health care provider about genetic screening.  If you have vaginal bleeding after reaching menopause, tell your health care provider.  There are currently no reliable tests available to screen for ovarian cancer.  Lung Cancer Lung cancer screening is recommended for adults 67-64 years old who are at high risk for lung cancer because of a history of smoking. A yearly low-dose CT scan of the lungs is recommended if you:  Currently smoke.  Have a history of at least 30 pack-years of smoking and you currently smoke or have quit within the past 15 years. A pack-year is smoking an average of one pack of cigarettes per day for one year.  Yearly screening should:  Continue until it has been 15 years since you quit.  Stop if you develop a health problem that would prevent you from having lung cancer treatment.  Colorectal Cancer  This type of cancer can be detected and can often be  prevented.  Routine colorectal cancer screening usually begins at age 108 and continues through age 85.  If you have risk factors for colon cancer, your health care provider may recommend that you be screened at an earlier age.  If you have a family history of colorectal cancer, talk with your health care provider about genetic screening.  Your health care provider may also recommend using home test kits to check for hidden blood in your stool.  A small camera at the end of a tube can be used to examine your colon directly (sigmoidoscopy or colonoscopy). This is done to check for the earliest forms of colorectal cancer.  Direct  examination of the colon should be repeated every 5-10 years until age 10. However, if early forms of precancerous polyps or small growths are found or if you have a family history or genetic risk for colorectal cancer, you may need to be screened more often.  Skin Cancer  Check your skin from head to toe regularly.  Monitor any moles. Be sure to tell your health care provider: ? About any new moles or changes in moles, especially if there is a change in a mole's shape or color. ? If you have a mole that is larger than the size of a pencil eraser.  If any of your family members has a history of skin cancer, especially at a Sule[employer] G[place] age, talk with your health care provider about genetic screening.  Always use sunscreen. Apply sunscreen liberally and repeatedly throughout the day.  Whenever you are outside, protect yourself by wearing long sleeves, pants, a wide-brimmed hat, and sunglasses.  What should I know about osteoporosis? Osteoporosis is a condition in which bone destruction happens more quickly than new bone creation. After menopause, you may be at an increased risk for osteoporosis. To help prevent osteoporosis or the bone fractures that can happen because of osteoporosis, the following is recommended:  If you are 36-50 years old, get at least 1,000 mg of calcium and at least 600 mg of vitamin D per day.  If you are older than age 7 but younger than age 29, get at least 1,200 mg of calcium and at least 600 mg of vitamin D per day.  If you are older than age 79, get at least 1,200 mg of calcium and at least 800 mg of vitamin D per day.  Smoking and excessive alcohol intake increase the risk of osteoporosis. Eat foods that are rich in calcium and vitamin D, and do weight-bearing exercises several times each week as directed by your health care provider. What should I know about how menopause affects my mental health? Depression may occur at any age, but it is more common as you become  older. Common symptoms of depression include:  Low or sad mood.  Changes in sleep patterns.  Changes in appetite or eating patterns.  Feeling an overall lack of motivation or enjoyment of activities that you previously enjoyed.  Frequent crying spells.  Talk with your health care provider if you think that you are experiencing depression. What should I know about immunizations? It is important that you get and maintain your immunizations. These include:  Tetanus, diphtheria, and pertussis (Tdap) booster vaccine.  Influenza every year before the flu season begins.  Pneumonia vaccine.  Shingles vaccine.  Your health care provider may also recommend other immunizations. This information is not intended to replace advice given to you by your health care provider. Make sure you discuss any questions you  have with your health care provider. Document Released: 01/06/2006 Document Revised: 06/03/2016 Document Reviewed: 08/18/2015 Elsevier Interactive Patient Education  2018 [place] [employer].  Plantar Fasciitis Plantar fasciitis is a painful foot condition that affects the heel. It occurs when the band of tissue that connects the toes to the heel bone (plantar fascia) becomes irritated. This can happen after exercising too much or doing other repetitive activities (overuse injury). The pain from plantar fasciitis can range from mild irritation to severe pain that makes it difficult for you to walk or move. The pain is usually worse in the morning or after you have been sitting or lying down for a while. What are the causes? This condition may be caused by:  Standing for long periods of time.  Wearing shoes that do not fit.  Doing high-impact activities, including running, aerobics, and ballet.  Being overweight.  Having an abnormal way of walking (gait).  Having tight calf muscles.  Having high arches in your feet.  Starting a new athletic activity.  What are the signs or  symptoms? The main symptom of this condition is heel pain. Other symptoms include:  Pain that gets worse after activity or exercise.  Pain that is worse in the morning or after resting.  Pain that goes away after you walk for a few minutes.  How is this diagnosed? This condition may be diagnosed based on your signs and symptoms. Your health care provider will also do a physical exam to check for:  A tender area on the bottom of your foot.  A high arch in your foot.  Pain when you move your foot.  Difficulty moving your foot.  You may also need to have imaging studies to confirm the diagnosis. These can include:  X-rays.  Ultrasound.  MRI.  How is this treated? Treatment for plantar fasciitis depends on the severity of the condition. Your treatment may include:  Rest, ice, and over-the-counter pain medicines to manage your pain.  Exercises to stretch your calves and your plantar fascia.  A splint that holds your foot in a stretched, upward position while you sleep (night splint).  Physical therapy to relieve symptoms and prevent problems in the future.  Cortisone injections to relieve severe pain.  Extracorporeal shock wave therapy (ESWT) to stimulate damaged plantar fascia with electrical impulses. It is often used as a last resort before surgery.  Surgery, if other treatments have not worked after 12 months.  Follow these instructions at home:  Take medicines only as directed by your health care provider.  Avoid activities that cause pain.  Roll the bottom of your foot over a bag of ice or a bottle of cold water. Do this for 20 minutes, 3-4 times a day.  Perform simple stretches as directed by your health care provider.  Try wearing athletic shoes with air-sole or gel-sole cushions or soft shoe inserts.  Wear a night splint while sleeping, if directed by your health care provider.  Keep all follow-up appointments with your health care provider. How is this  prevented?  Do not perform exercises or activities that cause heel pain.  Consider finding low-impact activities if you continue to have problems.  Lose weight if you need to. The best way to prevent plantar fasciitis is to avoid the activities that aggravate your plantar fascia. Contact a health care provider if:  Your symptoms do not go away after treatment with home care measures.  Your pain gets worse.  Your pain affects your ability to  move or do your daily activities. This information is not intended to replace advice given to you by your health care provider. Make sure you discuss any questions you have with your health care provider. Document Released: 08/09/2001 Document Revised: 04/18/2016 Document Reviewed: 09/24/2014 Elsevier Interactive Patient Education  [employer].

## 2018-03-22 ENCOUNTER — Ambulatory Visit: Payer: Self-pay | Admitting: Registered Nurse

## 2018-03-22 VITALS — BP 150/90 | HR 72

## 2018-03-22 DIAGNOSIS — E6609 Other obesity due to excess calories: Secondary | ICD-10-CM

## 2018-03-22 DIAGNOSIS — Z6830 Body mass index (BMI) 30.0-30.9, adult: Secondary | ICD-10-CM

## 2018-03-22 DIAGNOSIS — S66911A Strain of unspecified muscle, fascia and tendon at wrist and hand level, right hand, initial encounter: Secondary | ICD-10-CM

## 2018-03-22 DIAGNOSIS — M722 Plantar fascial fibromatosis: Secondary | ICD-10-CM

## 2018-03-22 MED ORDER — IBUPROFEN 800 MG PO TABS: 800.0000 mg | ORAL_TABLET | Freq: Three times a day (TID) | ORAL | 0 refills | Status: AC | PRN

## 2018-03-22 NOTE — Patient Instructions (Signed)
Plantar Fasciitis Plantar fasciitis is a painful foot condition that affects the heel. It occurs when the band of tissue that connects the toes to the heel bone (plantar fascia) becomes irritated. This can happen after exercising too much or doing other repetitive activities (overuse injury). The pain from plantar fasciitis can range from mild irritation to severe pain that makes it difficult for you to walk or move. The pain is usually worse in the morning or after you have been sitting or lying down for a while. What are the causes? This condition may be caused by:  Standing for long periods of time.  Wearing shoes that do not fit.  Doing high-impact activities, including running, aerobics, and ballet.  Being overweight.  Having an abnormal way of walking (gait).  Having tight calf muscles.  Having high arches in your feet.  Starting a new athletic activity.  What are the signs or symptoms? The main symptom of this condition is heel pain. Other symptoms include:  Pain that gets worse after activity or exercise.  Pain that is worse in the morning or after resting.  Pain that goes away after you walk for a few minutes.  How is this diagnosed? This condition may be diagnosed based on your signs and symptoms. Your health care provider will also do a physical exam to check for:  A tender area on the bottom of your foot.  A high arch in your foot.  Pain when you move your foot.  Difficulty moving your foot.  You may also need to have imaging studies to confirm the diagnosis. These can include:  X-rays.  Ultrasound.  MRI.  How is this treated? Treatment for plantar fasciitis depends on the severity of the condition. Your treatment may include:  Rest, ice, and over-the-counter pain medicines to manage your pain.  Exercises to stretch your calves and your plantar fascia.  A splint that holds your foot in a stretched, upward position while you sleep (night  splint).  Physical therapy to relieve symptoms and prevent problems in the future.  Cortisone injections to relieve severe pain.  Extracorporeal shock wave therapy (ESWT) to stimulate damaged plantar fascia with electrical impulses. It is often used as a last resort before surgery.  Surgery, if other treatments have not worked after 12 months.  Follow these instructions at home:  Take medicines only as directed by your health care provider.  Avoid activities that cause pain.  Roll the bottom of your foot over a bag of ice or a bottle of cold water. Do this for 20 minutes, 3-4 times a day.  Perform simple stretches as directed by your health care provider.  Try wearing athletic shoes with air-sole or gel-sole cushions or soft shoe inserts.  Wear a night splint while sleeping, if directed by your health care provider.  Keep all follow-up appointments with your health care provider. How is this prevented?  Do not perform exercises or activities that cause heel pain.  Consider finding low-impact activities if you continue to have problems.  Lose weight if you need to. The best way to prevent plantar fasciitis is to avoid the activities that aggravate your plantar fascia. Contact a health care provider if:  Your symptoms do not go away after treatment with home care measures.  Your pain gets worse.  Your pain affects your ability to move or do your daily activities. This information is not intended to replace advice given to you by your health care provider. Make sure you   discuss any questions you have with your health care provider. Document Released: 08/09/2001 Document Revised: 04/18/2016 Document Reviewed: 09/24/2014 Elsevier Interactive Patient Education  2018 [place]. Plantar Fasciitis Rehab Ask your health care provider which exercises are safe for you. Do exercises exactly as told by your health care provider and adjust them as directed. It is normal to feel mild  stretching, pulling, tightness, or discomfort as you do these exercises, but you should stop right away if you feel sudden pain or your pain gets worse. Do not begin these exercises until told by your health care provider. Stretching and range of motion exercises These exercises warm up your muscles and joints and improve the movement and flexibility of your foot. These exercises also help to relieve pain. Exercise A: Plantar fascia stretch  1. Sit with your left / right leg crossed over your opposite knee. 2. Hold your heel with one hand with that thumb near your arch. With your other hand, hold your toes and gently pull them back toward the top of your foot. You should feel a stretch on the bottom of your toes or your foot or both. 3. Hold this stretch for____10______ seconds. 4. Slowly release your toes and return to the starting position. Repeat ____3______ times. Complete this exercise __2________ times a day. Exercise B: Gastroc, standing  1. Stand with your hands against a wall. 2. Extend your left / right leg behind you, and bend your front knee slightly. 3. Keeping your heels on the floor and keeping your back knee straight, shift your weight toward the wall without arching your back. You should feel a gentle stretch in your left / right calf. 4. Hold this position for __________ seconds. Repeat __________ times. Complete this exercise __________ times a day. Exercise C: Soleus, standing 1. Stand with your hands against a wall. 2. Extend your left / right leg behind you, and bend your front knee slightly. 3. Keeping your heels on the floor, bend your back knee and slightly shift your weight over the back leg. You should feel a gentle stretch deep in your calf. 4. Hold this position for ___10_______ seconds. Repeat ____3______ times. Complete this exercise ___2_______ times a day. Exercise D: Gastrocsoleus, standing 1. Stand with the ball of your left / right foot on a step. The ball  of your foot is on the walking surface, right under your toes. 2. Keep your other foot firmly on the same step. 3. Hold onto the wall or a railing for balance. 4. Slowly lift your other foot, allowing your body weight to press your heel down over the edge of the step. You should feel a stretch in your left / right calf. 5. Hold this position for ___10_______ seconds. 6. Return both feet to the step. 7. Repeat this exercise with a slight bend in your left / right knee. Repeat ____3______ times with your left / right knee straight and ____3______ times with your left / right knee bent. Complete this exercise __2________ times a day. Balance exercise This exercise builds your balance and strength control of your arch to help take pressure off your plantar fascia. Exercise E: Single leg stand 1. Without shoes, stand near a railing or in a doorway. You may hold onto the railing or door frame as needed. 2. Stand on your left / right foot. Keep your big toe down on the floor and try to keep your arch lifted. Do not let your foot roll inward. 3. Hold this position  for _____10_____ seconds. 4. If this exercise is too easy, you can try it with your eyes closed or while standing on a pillow. Repeat ____3______ times. Complete this exercise _____2_____ times a day. This information is not intended to replace advice given to you by your health care provider. Make sure you discuss any questions you have with your health care provider. Document Released: 11/14/2005 Document Revised: 07/19/2016 Document Reviewed: 09/28/2015 Elsevier Interactive Patient Education  2018 [employer]. Wrist Sprain Rehab Ask your health care provider which exercises are safe for you. Do exercises exactly as told by your health care provider and adjust them as directed. It is normal to feel mild stretching, pulling, tightness, or discomfort as you do these exercises, but you should stop right away if you feel sudden pain or your  pain gets worse.Do not begin these exercises until told by your health care provider. Stretching and range of motion exercises These exercises warm up your muscles and joints and improve the movement and flexibility of your wrist. These exercises also help to relieve pain, numbness, and tingling. Exercise A: Wrist flexion, active 1. Extend your left / right arm in front of you, and point your fingers downward. 2. If told by your health care provider, bend your left / right arm. 3. Try to bring your palm toward your forearm as far as you can without pain. You should feel a gentle stretch on the top of your forearm and wrist. 4. Hold this position for ___10_______ seconds. 5. Slowly return to the starting position. Repeat ____3______ times. Complete this exercise ____2______ times a day. Exercise B: Wrist extension, active 1. Extend your left / right arm in front of you and turn your palm upward. 2. If told by your health care provider, bend your left / right arm. 3. Bring your palm and fingertips back so your fingers point downward. You should feel a gentle stretch on the inside of your forearm and wrist. 4. Hold this position for __10________ seconds. 5. Slowly return to the starting position. Repeat ____3____ times. Complete this exercise ____2______ times a day. Exercise C: Supination, active 1. Stand or sit with your arms at your sides. 2. Bend your left / right elbow to an "L" shape (90 degrees). 3. Turn your palm upward until you feel a gentle stretch in the inside of your forearm. 4. Hold this position for ___10_______ seconds. 5. Slowly return your palm to the starting position. Repeat _____3____ times. Complete this stretch ______2____ times a day. Exercise D: Pronation, active 1. Stand or sit with your arms at your sides. 2. Bend your left / right elbow to an "L" shape (90 degrees). 3. Turn your palm downward until you feel a gentle stretch in the top of your forearm. 4. Hold  this position for ____10______ seconds. 5. Slowly return your palm to the starting position. Repeat ___3_______ times. Complete this stretch ______2____ times a day. Strengthening exercises These exercises build strength and endurance in your wrist. Endurance is the ability to use your muscles for a long time, even after they get tired. Exercise E: Wrist flexors 1. Sit with your left / right forearm supported on a table and your hand resting palm-up over the edge of the table. Your elbow should be below the level of your shoulder. 2. Hold a _____none_____ weightin your left / right hand. Or, hold a rubber exercise band or tube in both hands, keeping your hands at the same level and hip distance apart. There should  be a slight tension in the exercise band or tube. 3. Slowly curl your hand up toward your forearm. 4. Hold this position for ______10____ seconds. 5. Slowly lower your hand back to the starting position. Repeat _____3_____ times. Complete this exercise ______2____ times a day. Exercise F: Wrist extensors 1. Sit with your left / right forearm supported on a table and your hand resting palm-down over the edge of the table. Your elbow should be below the level of your shoulder. 2. Hold a _____none_____ weight in your left / right hand. Or, hold a rubber exercise band or tube in both hands, keeping your hands at the same level and hip distance apart. There should be a slight tension in the exercise band or tube. 3. Slowly curl your hand up toward your forearm. 4. Hold this position for ___10_______ seconds. 5. Slowly lower your hand to the starting position. Repeat _____3___ times. Complete this exercise _____2_____ times a day. This information is not intended to replace advice given to you by your health care provider. Make sure you discuss any questions you have with your health care provider. Document Released: 11/14/2005 Document Revised: 07/20/2016 Document Reviewed:  08/01/2015 Elsevier Interactive Patient Education  2018 [place]. Wrist Splint A splint is a medical device that keeps an injured part of your body from moving. A splint supports your wrist like a cast, but it is more flexible. It can be removed or loosened. The supporting part of a splint does not completely surround your wrist. It is held in place with an elastic band or straps. You may need a wrist splint if you have hurt your wrist or if you have a condition that causes swelling. Depending on the type of wrist problem you have, your splint may extend up your arm, onto your hand, or around your thumb. The wrist splint may be worn to:  Support your wrist.  Protect your injury.  Prevent further injury.  Prevent movement.  Reduce pain.  Promote healing. RISKS AND COMPLICATIONS The most dangerous complication of wearing a splint is having a reduced blood supply to your wrist or hand. This can happen if there is a lot of swelling or if the splint is too tight. This results in a condition called compartment syndrome and can cause permanent damage. Symptoms include:  Pain that is getting worse.  Tingling and numbness.  Changes in skin color (paleness or a bluish color).  Cold fingers. Other complications of wearing a splint can include:   Skin irritation that can cause: ? Itching. ? Rash. ? Skin sores. ? Skin infection.  Wrist stiffness. This can occur if you have worn a splint for a long time. HOW TO USE YOUR WRIST SPLINT Your wrist splint should be tight enough to support your wrist without cutting off your blood supply. How long you need to wear the splint depends on the type of wrist problem you have. Your health care provider will instruct you on how to wear your wrist splint and for how long.  Follow all your health care provider's instructions.  Take medicine only as directed by your health care provider.  Keep your wrist elevated above the level of your heart while  resting.  Ice may help reduce pain and swelling. ? Place ice in a plastic bag. ? Place a towel between your splint and the bag. ? Leave the ice on for 20 minutes, 2-3 times a day.  Do not get your splint wet.  Do not push objects  under your splint to scratch your skin.  Loosen your splint if it feels too tight. Consult your health care provider if you have questions about how tight to wear the splint.  Keep all follow-up visits as directed by your health care provider. This is important. SEEK MEDICAL CARE IF:  You have wrist pain or swelling that does not go away.  The skin around or under your splint becomes red, itchy, or moist.  You have chills or fever.  Your splint feels too tight or too loose.  Your splint gets damaged. SEEK IMMEDIATE MEDICAL CARE IF: You have symptoms of compartment syndrome. These include:   Pain that is getting worse.  Tingling and numbness.  Changes in skin color (paleness or a bluish color).  Cold fingers. MAKE SURE YOU:  Understand these instructions.  Will watch your condition.  Will get help right away if you are not doing well or get worse. This information is not intended to replace advice given to you by your health care provider. Make sure you discuss any questions you have with your health care provider. Document Released: 10/27/2006 Document Revised: 12/05/2014 Document Reviewed: 02/25/2014 Elsevier Interactive Patient Education  2017 [employer].

## 2018-03-22 NOTE — Progress Notes (Signed)
Subjective:    Patient ID: Kimberly Hall, female    DOB: 23-Jan-1961, 57 y.o.   MRN: [id number]  56y/o Caucasian established female pt c/o R heel pain and R wrist pain. Heel pain has been present x6 months. Worse in the morning upon trying to stand. Feels better as she walks more, but worse if she walks too much. Feels stiff, like it needs to stretch. Will also be more painful after sitting for an extended period, then trying to stand. Denies known previous Hx/Dx plantar fascitis. Has taken Tylenol occasionally for this pain but it didn't resolve pain.  For past 4-5 days has had new onset, progressively worsening R lateral wrist pain. Mild swelling present. No erythema, warmth. Denies known acute injury. Works as a Presenter, broadcasting at [employer]. She does endorse repetitive motions of bil hands and wrists. R hand dominant. Notices pain worsens throughout work day. Certain positions exacerbate pain. Pain is reproducible. Has not tried any specific interventions for this pain at home.   Broke right arm in early 2000s and noticed since that time it never was as strong as prior to fracture but was not giving her pain with twisting motions at it does now.        Review of Systems  Constitutional: Negative for activity change, appetite change, chills, diaphoresis, fatigue, fever and unexpected weight change.  HENT: Negative for trouble swallowing and voice change.   Eyes: Negative for photophobia and visual disturbance.  Respiratory: Negative for cough, shortness of breath, wheezing and stridor.   Cardiovascular: Negative for chest pain.  Gastrointestinal: Negative for diarrhea, nausea and vomiting.  Endocrine: Negative for cold intolerance and heat intolerance.  Genitourinary: Negative for difficulty urinating.  Musculoskeletal: Positive for myalgias. Negative for arthralgias, back pain, gait problem, joint swelling, neck pain and neck stiffness.  Skin: Negative for  color change, pallor, rash and wound.  Allergic/Immunologic: Negative for food allergies.  Neurological: Negative for dizziness, tremors, seizures, syncope, speech difficulty, weakness, light-headedness, numbness and headaches.  Hematological: Negative for adenopathy. Does not bruise/bleed easily.  Psychiatric/Behavioral: Negative for agitation, confusion and sleep disturbance.       Objective:   Physical Exam  Constitutional: She is oriented to person, place, and time. Vital signs are normal. She appears well-developed and well-nourished. She is active and cooperative.  Non-toxic appearance. She does not have a sickly appearance. She does not appear ill. No distress.  HENT:  Head: Normocephalic and atraumatic.  Right Ear: Hearing and external ear normal.  Left Ear: Hearing and external ear normal.  Nose: Nose normal.  Mouth/Throat: Uvula is midline, oropharynx is clear and moist and mucous membranes are normal. No oropharyngeal exudate.  Eyes: Pupils are equal, round, and reactive to light. Conjunctivae, EOM and lids are normal. Right eye exhibits no discharge. Left eye exhibits no discharge. No scleral icterus.  Neck: Trachea normal, normal range of motion and phonation normal. Neck supple. No muscular tenderness present. No neck rigidity. No tracheal deviation, no edema, no erythema and normal range of motion present.  Cardiovascular: Normal rate, regular rhythm, S1 normal, S2 normal, normal heart sounds and intact distal pulses. Exam reveals no gallop, no distant heart sounds and no friction rub.  No murmur heard. Pulses:      Radial pulses are 2+ on the right side, and 2+ on the left side.       Dorsalis pedis pulses are 2+ on the right side, and 2+ on the left side.  Posterior tibial pulses are 2+ on the right side, and 2+ on the left side.  Pulmonary/Chest: Effort normal and breath sounds normal. No accessory muscle usage or stridor. No respiratory distress. She has no decreased  breath sounds. She has no wheezes. She has no rhonchi. She has no rales. She exhibits no tenderness.  Abdominal: Soft. Normal appearance. She exhibits no distension, no fluid wave and no ascites. There is no rigidity and no guarding.  Musculoskeletal: She exhibits edema and tenderness.       Right shoulder: Normal.       Left shoulder: Normal.       Right elbow: Normal.      Left elbow: Normal.       Right wrist: She exhibits decreased range of motion, tenderness and swelling. She exhibits no bony tenderness, no effusion, no crepitus, no deformity and no laceration.       Left wrist: Normal.       Right hip: Normal.       Left hip: Normal.       Right knee: Normal.       Left knee: Normal.       Right ankle: Normal.       Left ankle: Normal.       Cervical back: Normal.       Thoracic back: Normal.       Lumbar back: Normal.       Right forearm: She exhibits tenderness, swelling and edema. She exhibits no bony tenderness, no deformity and no laceration.       Left forearm: Normal.       Arms:      Right hand: Normal.       Left hand: Normal.       Right foot: There is tenderness. There is normal range of motion, no bony tenderness, no swelling, normal capillary refill, no crepitus, no deformity and no laceration.       Left foot: There is tenderness. There is normal range of motion, no bony tenderness, no swelling, normal capillary refill, no crepitus, no deformity and no laceration.       Feet:  Pain with supination and pronation right; fingers a ok signs and full arom without pain; shoulder and elbow AROM equal bilaterally WNL; no erythema no nodules no crepitus with arom or palpation; bilateral medial heels TTP equally not exquisitely tender; achilles tendons not TTP/no crepitus/no thickening; plantar fascia adjacent to toes not TTP and toes full arom non tender  Lymphadenopathy:       Head (right side): No submental, no submandibular, no tonsillar, no preauricular, no posterior  auricular and no occipital adenopathy present.       Head (left side): No submental, no submandibular, no tonsillar, no preauricular, no posterior auricular and no occipital adenopathy present.    She has no cervical adenopathy.  Neurological: She is alert and oriented to person, place, and time. She has normal strength. She is not disoriented. She displays no atrophy, no tremor and normal reflexes. No cranial nerve deficit or sensory deficit. She exhibits normal muscle tone. She displays no seizure activity. Coordination and gait normal. GCS eye subscore is 4. GCS verbal subscore is 5. GCS motor subscore is 6.  Skin: Skin is warm, dry and intact. No abrasion, no bruising, no burn, no ecchymosis, no laceration, no lesion, no petechiae and no rash noted. She is not diaphoretic. No erythema. No pallor.  Psychiatric: She has a normal mood and affect. Her speech is  normal and behavior is normal. Judgment and thought content normal. Cognition and memory are normal.      Pain with supination and rotation right wrist; denied tingling/numbness at work; sometimes after sleeping; broke her right wrist early 2000s after fall said strength never went back to normal and AROm decreased since fracture  Fitted and distributed right wrist splint has reusable ice pack at home    Assessment & Plan:  A-right wrist strain initial visit and plantar fasciitis bilateral  P-wear splint at work and if lifting at home, f/u in 2 weeks, ice 15 minutes TID, stretches demonstrated AROM, motrin 800mg  po TID prn pain #30 RF0 dispensed from [facility] take for 2 weeks ensure taking after a meal or with snack to avoid gastritis.  Patient was instructed to rest, ice and elevate arm. [employer] handout on wrist strain with rehab exercises printed and given to patient. Consider splitting up lifting loads to smaller amounts and hold close to body.  Call or return to clinic as needed if these symptoms worsen or fail to improve as anticipated and  will consider PT, imaging and/or orthopedics evaluation. Patient verbalized agreement and understanding of treatment plan and had no further questions at this time. P2: ROM, injury prevention   Demonstrated gastrocnemius and plantar fascia stretches, get new pair of shoes as treads worn and alternate with another pair of shoes do not wear same pair of shoes 7 days per week.  Consider heel inserts, weight loss, cryotherapy with frozen water bottle TID 15 minutes, compressiuon socks versus plain cotton anklets.  History foot pain before stepping out of bed in am and worsens after long day at work. Patient given printed [employer] handout on plantar fasciitis and rehab exercises. Discussed stretches and icing. Follow up with PCM if no improvement with discussed care for podiatry referral. Start NSAID of choice (taken previously without side effects). Motrin 800mg  po TID #30 RF0 take with food x 2 weeks.  Consider new supportive footwear with cushion and raised heel or OTC inserts if shoe treads worn out/greater than 30 year old. Patient verbalized understanding of instructions, agreed with plan of care and had no further questions at this time.

## 2018-07-05 ENCOUNTER — Encounter: Payer: Self-pay | Admitting: Registered Nurse

## 2018-07-05 ENCOUNTER — Telehealth: Payer: Self-pay | Admitting: Registered Nurse

## 2018-07-05 MED ORDER — IBUPROFEN 800 MG PO TABS: 800.0000 mg | ORAL_TABLET | Freq: Three times a day (TID) | ORAL | 0 refills | Status: AC | PRN

## 2018-07-05 NOTE — Telephone Encounter (Signed)
Patient requested refill of motrin 800mg  po TID prn pain plantar fascitis last filled 03/22/2018 taking 1/2 to 1 tab prn not every day working well for her with food stated no to minimal stomach upset  Denied bloody stools. Dispensed motrin 800mg  po TID prn pain #30 RF0 from [facility] to patient today.

## 2018-08-30 ENCOUNTER — Ambulatory Visit: Payer: Self-pay | Admitting: Registered Nurse

## 2018-08-30 VITALS — BP 146/101 | HR 76 | Temp 97.4°F

## 2018-08-30 DIAGNOSIS — R030 Elevated blood-pressure reading, without diagnosis of hypertension: Secondary | ICD-10-CM

## 2018-08-30 DIAGNOSIS — M722 Plantar fascial fibromatosis: Secondary | ICD-10-CM

## 2018-08-30 MED ORDER — IBUPROFEN 800 MG PO TABS: 800.0000 mg | ORAL_TABLET | Freq: Three times a day (TID) | ORAL | 0 refills | Status: AC | PRN

## 2018-08-30 NOTE — Progress Notes (Signed)
Subjective:    Patient ID: Kimberly Hall, female    DOB: March 06, 1961, 57 y.o.   MRN: [id number]  57y/o Caucasian female pt c/o bil heel pain x6 months. R>L. However upon chart review, she was seen in April for same and reported pain had been present for 6 months at that point. Sts she does some stretches in the morning but did not recall specifics from this OV. Bought new shoes but wearing old ones with no show cotton  Socks ankle today.  Has not been icing consistently.  Typically taking motrin 400mg  po daily.  Last received refill from [facility] Aug 2019 [facility] formulary.  Currently, pain can extend into the arch of her foot. Sts pain is worst in the morning upon standing and walking, but continues throughout the day while walking. Does not have any pain when sitting or lying. Pain does not keep her up at night. Has tried Ibuprofen for the pain at home.  She is wondering what specialist she needs to see to resolve the pain completely.   Does not think she has gained weight stayed steady x 4 years.  Limping upon waking up in am then improves until end of day if ambulatory.     Review of Systems  Constitutional: Negative for activity change, appetite change, chills, diaphoresis, fatigue and fever.  HENT: Negative for trouble swallowing and voice change.   Eyes: Negative for photophobia and visual disturbance.  Respiratory: Negative for cough and wheezing.   Cardiovascular: Negative for leg swelling.  Gastrointestinal: Negative for diarrhea, nausea and vomiting.  Endocrine: Negative for cold intolerance and heat intolerance.  Genitourinary: Negative for difficulty urinating.  Musculoskeletal: Positive for gait problem and myalgias. Negative for arthralgias, back pain, joint swelling, neck pain and neck stiffness.  Skin: Negative for color change, pallor, rash and wound.  Allergic/Immunologic: Negative for food allergies.  Neurological: Negative for tremors, syncope, weakness and numbness.   Hematological: Negative for adenopathy. Does not bruise/bleed easily.  Psychiatric/Behavioral: Negative for agitation, confusion and sleep disturbance.       Objective:   Physical Exam  Constitutional: She is oriented to person, place, and time. Vital signs are normal. She appears well-developed and well-nourished. She is active and cooperative.  Non-toxic appearance. She does not have a sickly appearance. She does not appear ill. No distress.  HENT:  Head: Normocephalic and atraumatic.  Right Ear: External ear normal.  Left Ear: External ear normal.  Nose: Nose normal.  Mouth/Throat: Oropharynx is clear and moist. No oropharyngeal exudate.  Eyes: Pupils are equal, round, and reactive to light. Conjunctivae, EOM and lids are normal. Right eye exhibits no discharge. Left eye exhibits no discharge. No scleral icterus.  Neck: Trachea normal and normal range of motion. Neck supple. No tracheal deviation present.  Cardiovascular: Normal rate, regular rhythm, normal heart sounds and intact distal pulses.  Pulmonary/Chest: Effort normal and breath sounds normal. No stridor. No respiratory distress. She has no wheezes. She has no rales.  Abdominal: Soft. She exhibits no distension. There is no guarding.  Musculoskeletal: She exhibits tenderness. She exhibits no edema.       Right shoulder: Normal.       Left shoulder: Normal.       Right elbow: Normal.      Left elbow: Normal.       Right hip: Normal.       Left hip: Normal.       Right knee: Normal.       Left  knee: Normal.       Right ankle: Normal.       Left ankle: Normal.       Cervical back: Normal.       Thoracic back: Normal.       Lumbar back: Normal.       Right hand: Normal.       Left hand: Normal.       Right lower leg: Normal.       Left lower leg: Normal.       Right foot: There is tenderness. There is normal range of motion, no bony tenderness, no swelling, normal capillary refill, no crepitus, no deformity and no  laceration.       Left foot: There is tenderness. There is normal range of motion, no bony tenderness, no swelling, normal capillary refill, no crepitus, no deformity and no laceration.       Feet:  Bilateral heels medially TTP soft tissue exquisitely with deep palpation increased warmth calcaneous also; treads completely worn off sketcher slip on foam shoes midfoot and heel  Feet:  Right Foot:  Skin Integrity: Positive for warmth and callus. Negative for ulcer, blister, skin breakdown, erythema or dry skin.  Left Foot:  Skin Integrity: Positive for warmth and callus. Negative for ulcer, blister, skin breakdown, erythema or dry skin.  Lymphadenopathy:       Head (right side): No submental, no submandibular and no preauricular adenopathy present.       Head (left side): No submental, no submandibular and no preauricular adenopathy present.    She has no cervical adenopathy.       Right cervical: No superficial cervical adenopathy present.      Left cervical: No superficial cervical adenopathy present.  Neurological: She is alert and oriented to person, place, and time. She has normal strength. She is not disoriented. She displays no atrophy, no tremor and normal reflexes. No cranial nerve deficit or sensory deficit. She exhibits normal muscle tone. She displays no seizure activity. Coordination and gait normal. GCS eye subscore is 4. GCS verbal subscore is 5. GCS motor subscore is 6.  Gait sure and steady in hallway; on/off exam table and in/out of chair without difficulty  Skin: Skin is warm, dry and intact. Capillary refill takes less than 2 seconds. No abrasion, no bruising, no burn, no ecchymosis, no purpura and no rash noted. Rash is not macular, not papular, not maculopapular, not nodular, not pustular, not vesicular and not urticarial. She is not diaphoretic. No cyanosis or erythema. No pallor. Nails show no clubbing.  Psychiatric: She has a normal mood and affect. Her speech is normal and  behavior is normal. Judgment and thought content normal. She is not actively hallucinating. Cognition and memory are normal. She is attentive.  Nursing note and vitals reviewed.         Assessment & Plan:  A-elevated blood pressure, obesity, bilateral heel pain  P-acute pain may elevate blood pressure.  Discussed weight loss to help blood pressure and heel pain.  Seek re-evaluation if chest pain, SOB, visual changes or worst headache of life.  Follow up with PCM and RN Hildred Alamin for repeat BP on day with less foot pain.  Demonstrated gastrocnemius and plantar fascia stretches, get new pair of shoes as treads worn and alternate with another pair of shoes do not wear same pair of shoes 7 days per week.  Consider heel inserts, weight loss, cryotherapy with frozen water bottle TID 15 minutes, compressiuon socks versus  plain cotton anklets.  History foot pain before stepping out of bed in am and worsens after Hall day at work. Patient refused printed [employer] handout on plantar fasciitis and rehab exercises. Discussed stretches and icing. Follow up with PCM if no improvement with discussed care for podiatry referral as may need injection or custom orthotics. Start NSAID of choice (taken previously without side effects). Motrin 800mg  po TID #30 RF0 take with food x 2 weeks--take at least 1 800mg  tablet with food daily currently 400mg  sometimes.  Consider new supportive footwear with cushion and raised heel or OTC inserts if shoe treads worn out/greater than 45 year old. Patient will come to clinic for RN Hildred Alamin to evaluate tread wear on her new shoes when she next wears to work. Patient verbalized understanding of instructions, agreed with plan of care and had no further questions at this time.

## 2018-08-30 NOTE — Patient Instructions (Signed)
Plantar Fasciitis Rehab Ask your health care provider which exercises are safe for you. Do exercises exactly as told by your health care provider and adjust them as directed. It is normal to feel mild stretching, pulling, tightness, or discomfort as you do these exercises, but you should stop right away if you feel sudden pain or your pain gets worse. Do not begin these exercises until told by your health care provider. Stretching and range of motion exercises These exercises warm up your muscles and joints and improve the movement and flexibility of your foot. These exercises also help to relieve pain. Exercise A: Plantar fascia stretch  1. Sit with your left / right leg crossed over your opposite knee. 2. Hold your heel with one hand with that thumb near your arch. With your other hand, hold your toes and gently pull them back toward the top of your foot. You should feel a stretch on the bottom of your toes or your foot or both. 3. Hold this stretch for__________ seconds. 4. Slowly release your toes and return to the starting position. Repeat __________ times. Complete this exercise __________ times a day. Exercise B: Gastroc, standing  1. Stand with your hands against a wall. 2. Extend your left / right leg behind you, and bend your front knee slightly. 3. Keeping your heels on the floor and keeping your back knee straight, shift your weight toward the wall without arching your back. You should feel a gentle stretch in your left / right calf. 4. Hold this position for __________ seconds. Repeat __________ times. Complete this exercise __________ times a day. Exercise C: Soleus, standing 1. Stand with your hands against a wall. 2. Extend your left / right leg behind you, and bend your front knee slightly. 3. Keeping your heels on the floor, bend your back knee and slightly shift your weight over the back leg. You should feel a gentle stretch deep in your calf. 4. Hold this position for  __________ seconds. Repeat __________ times. Complete this exercise __________ times a day. Exercise D: Gastrocsoleus, standing 1. Stand with the ball of your left / right foot on a step. The ball of your foot is on the walking surface, right under your toes. 2. Keep your other foot firmly on the same step. 3. Hold onto the wall or a railing for balance. 4. Slowly lift your other foot, allowing your body weight to press your heel down over the edge of the step. You should feel a stretch in your left / right calf. 5. Hold this position for __________ seconds. 6. Return both feet to the step. 7. Repeat this exercise with a slight bend in your left / right knee. Repeat __________ times with your left / right knee straight and __________ times with your left / right knee bent. Complete this exercise __________ times a day. Balance exercise This exercise builds your balance and strength control of your arch to help take pressure off your plantar fascia. Exercise E: Single leg stand 1. Without shoes, stand near a railing or in a doorway. You may hold onto the railing or door frame as needed. 2. Stand on your left / right foot. Keep your big toe down on the floor and try to keep your arch lifted. Do not let your foot roll inward. 3. Hold this position for __________ seconds. 4. If this exercise is too easy, you can try it with your eyes closed or while standing on a pillow. Repeat __________ times. Complete  this exercise __________ times a day. This information is not intended to replace advice given to you by your health care provider. Make sure you discuss any questions you have with your health care provider. Document Released: 11/14/2005 Document Revised: 07/19/2016 Document Reviewed: 09/28/2015 Elsevier Interactive Patient Education  2018 [place]. Plantar Fasciitis Plantar fasciitis is a painful foot condition that affects the heel. It occurs when the band of tissue that connects the toes  to the heel bone (plantar fascia) becomes irritated. This can happen after exercising too much or doing other repetitive activities (overuse injury). The pain from plantar fasciitis can range from mild irritation to severe pain that makes it difficult for you to walk or move. The pain is usually worse in the morning or after you have been sitting or lying down for a while. What are the causes? This condition may be caused by:  Standing for long periods of time.  Wearing shoes that do not fit.  Doing high-impact activities, including running, aerobics, and ballet.  Being overweight.  Having an abnormal way of walking (gait).  Having tight calf muscles.  Having high arches in your feet.  Starting a new athletic activity.  What are the signs or symptoms? The main symptom of this condition is heel pain. Other symptoms include:  Pain that gets worse after activity or exercise.  Pain that is worse in the morning or after resting.  Pain that goes away after you walk for a few minutes.  How is this diagnosed? This condition may be diagnosed based on your signs and symptoms. Your health care provider will also do a physical exam to check for:  A tender area on the bottom of your foot.  A high arch in your foot.  Pain when you move your foot.  Difficulty moving your foot.  You may also need to have imaging studies to confirm the diagnosis. These can include:  X-rays.  Ultrasound.  MRI.  How is this treated? Treatment for plantar fasciitis depends on the severity of the condition. Your treatment may include:  Rest, ice, and over-the-counter pain medicines to manage your pain.  Exercises to stretch your calves and your plantar fascia.  A splint that holds your foot in a stretched, upward position while you sleep (night splint).  Physical therapy to relieve symptoms and prevent problems in the future.  Cortisone injections to relieve severe pain.  Extracorporeal shock  wave therapy (ESWT) to stimulate damaged plantar fascia with electrical impulses. It is often used as a last resort before surgery.  Surgery, if other treatments have not worked after 12 months.  Follow these instructions at home:  Take medicines only as directed by your health care provider.  Avoid activities that cause pain.  Roll the bottom of your foot over a bag of ice or a bottle of cold water. Do this for 20 minutes, 3-4 times a day.  Perform simple stretches as directed by your health care provider.  Try wearing athletic shoes with air-sole or gel-sole cushions or soft shoe inserts.  Wear a night splint while sleeping, if directed by your health care provider.  Keep all follow-up appointments with your health care provider. How is this prevented?  Do not perform exercises or activities that cause heel pain.  Consider finding low-impact activities if you continue to have problems.  Lose weight if you need to. The best way to prevent plantar fasciitis is to avoid the activities that aggravate your plantar fascia. Contact a health  care provider if:  Your symptoms do not go away after treatment with home care measures.  Your pain gets worse.  Your pain affects your ability to move or do your daily activities. This information is not intended to replace advice given to you by your health care provider. Make sure you discuss any questions you have with your health care provider. Document Released: 08/09/2001 Document Revised: 04/18/2016 Document Reviewed: 09/24/2014 Elsevier Interactive Patient Education  2018 [employer]. Obesity, Adult Obesity is the condition of having too much total body fat. Being overweight or obese means that your weight is greater than what is considered healthy for your body size. Obesity is determined by a measurement called BMI. BMI is an estimate of body fat and is calculated from height and weight. For adults, a BMI of 30 or higher is considered  obese. Obesity can eventually lead to other health concerns and major illnesses, including:  Stroke.  Coronary artery disease (CAD).  Type 2 diabetes.  Some types of cancer, including cancers of the colon, breast, uterus, and gallbladder.  Osteoarthritis.  High blood pressure (hypertension).  High cholesterol.  Sleep apnea.  Gallbladder stones.  Infertility problems.  What are the causes? The main cause of obesity is taking in (consuming) more calories than your body uses for energy. Other factors that contribute to this condition may include:  Being born with genes that make you more likely to become obese.  Having a medical condition that causes obesity. These conditions include: ? Hypothyroidism. ? Polycystic ovarian syndrome (PCOS). ? Binge-eating disorder. ? Cushing syndrome.  Taking certain medicines, such as steroids, antidepressants, and seizure medicines.  Not being physically active (sedentary lifestyle).  Living where there are limited places to exercise safely or buy healthy foods.  Not getting enough sleep.  What increases the risk? The following factors may increase your risk of this condition:  Having a family history of obesity.  Being a woman of African-American descent.  Being a man of Hispanic descent.  What are the signs or symptoms? Having excessive body fat is the main symptom of this condition. How is this diagnosed? This condition may be diagnosed based on:  Your symptoms.  Your medical history.  A physical exam. Your health care provider may measure: ? Your BMI. If you are an adult with a BMI between 25 and less than 30, you are considered overweight. If you are an adult with a BMI of 30 or higher, you are considered obese. ? The distances around your hips and your waist (circumferences). These may be compared to each other to help diagnose your condition. ? Your skinfold thickness. Your health care provider may gently pinch a  fold of your skin and measure it.  How is this treated? Treatment for this condition often includes changing your lifestyle. Treatment may include some or all of the following:  Dietary changes. Work with your health care provider and a dietitian to set a weight-loss goal that is healthy and reasonable for you. Dietary changes may include eating: ? Smaller portions. A portion size is the amount of a particular food that is healthy for you to eat at one time. This varies from person to person. ? Low-calorie or low-fat options. ? More whole grains, fruits, and vegetables.  Regular physical activity. This may include aerobic activity (cardio) and strength training.  Medicine to help you lose weight. Your health care provider may prescribe medicine if you are unable to lose 1 pound a week after 6  weeks of eating more healthily and doing more physical activity.  Surgery. Surgical options may include gastric banding and gastric bypass. Surgery may be done if: ? Other treatments have not helped to improve your condition. ? You have a BMI of 40 or higher. ? You have life-threatening health problems related to obesity.  Follow these instructions at home:  Eating and drinking   Follow recommendations from your health care provider about what you eat and drink. Your health care provider may advise you to: ? Limit fast foods, sweets, and processed snack foods. ? Choose low-fat options, such as low-fat milk instead of whole milk. ? Eat 5 or more servings of fruits or vegetables every day. ? Eat at home more often. This gives you more control over what you eat. ? Choose healthy foods when you eat out. ? Learn what a healthy portion size is. ? Keep low-fat snacks on hand. ? Avoid sugary drinks, such as soda, fruit juice, iced tea sweetened with sugar, and flavored milk. ? Eat a healthy breakfast.  Drink enough water to keep your urine clear or pale yellow.  Do not go without eating for long  periods of time (do not fast) or follow a fad diet. Fasting and fad diets can be unhealthy and even dangerous. Physical Activity  Exercise regularly, as told by your health care provider. Ask your health care provider what types of exercise are safe for you and how often you should exercise.  Warm up and stretch before being active.  Cool down and stretch after being active.  Rest between periods of activity. Lifestyle  Limit the time that you spend in front of your TV, computer, or video game system.  Find ways to reward yourself that do not involve food.  Limit alcohol intake to no more than 1 drink a day for nonpregnant women and 2 drinks a day for men. One drink equals 12 oz of beer, 5 oz of wine, or 1 oz of hard liquor. General instructions  Keep a weight loss journal to keep track of the food you eat and how much you exercise you get.  Take over-the-counter and prescription medicines only as told by your health care provider.  Take vitamins and supplements only as told by your health care provider.  Consider joining a support group. Your health care provider may be able to recommend a support group.  Keep all follow-up visits as told by your health care provider. This is important. Contact a health care provider if:  You are unable to meet your weight loss goal after 6 weeks of dietary and lifestyle changes. This information is not intended to replace advice given to you by your health care provider. Make sure you discuss any questions you have with your health care provider. Document Released: 12/22/2004 Document Revised: 04/18/2016 Document Reviewed: 09/02/2015 Elsevier Interactive Patient Education  2018 [employer]. Hypertension Hypertension, commonly called high blood pressure, is when the force of blood pumping through the arteries is too strong. The arteries are the blood vessels that carry blood from the heart throughout the body. Hypertension forces the heart to  work harder to pump blood and may cause arteries to become narrow or stiff. Having untreated or uncontrolled hypertension can cause heart attacks, strokes, kidney disease, and other problems. A blood pressure reading consists of a higher number over a lower number. Ideally, your blood pressure should be below 120/80. The first ("top") number is called the systolic pressure. It is a measure of  the pressure in your arteries as your heart beats. The second ("bottom") number is called the diastolic pressure. It is a measure of the pressure in your arteries as the heart relaxes. What are the causes? The cause of this condition is not known. What increases the risk? Some risk factors for high blood pressure are under your control. Others are not. Factors you can change  Smoking.  Having type 2 diabetes mellitus, high cholesterol, or both.  Not getting enough exercise or physical activity.  Being overweight.  Having too much fat, sugar, calories, or salt (sodium) in your diet.  Drinking too much alcohol. Factors that are difficult or impossible to change  Having chronic kidney disease.  Having a family history of high blood pressure.  Age. Risk increases with age.  Race. You may be at higher risk if you are African-American.  Gender. Men are at higher risk than women before age 17. After age 40, women are at higher risk than men.  Having obstructive sleep apnea.  Stress. What are the signs or symptoms? Extremely high blood pressure (hypertensive crisis) may cause:  Headache.  Anxiety.  Shortness of breath.  Nosebleed.  Nausea and vomiting.  Severe chest pain.  Jerky movements you cannot control (seizures).  How is this diagnosed? This condition is diagnosed by measuring your blood pressure while you are seated, with your arm resting on a surface. The cuff of the blood pressure monitor will be placed directly against the skin of your upper arm at the level of your heart.  It should be measured at least twice using the same arm. Certain conditions can cause a difference in blood pressure between your right and left arms. Certain factors can cause blood pressure readings to be lower or higher than normal (elevated) for a short period of time:  When your blood pressure is higher when you are in a health care provider's office than when you are at home, this is called white coat hypertension. Most people with this condition do not need medicines.  When your blood pressure is higher at home than when you are in a health care provider's office, this is called masked hypertension. Most people with this condition may need medicines to control blood pressure.  If you have a high blood pressure reading during one visit or you have normal blood pressure with other risk factors:  You may be asked to return on a different day to have your blood pressure checked again.  You may be asked to monitor your blood pressure at home for 1 week or longer.  If you are diagnosed with hypertension, you may have other blood or imaging tests to help your health care provider understand your overall risk for other conditions. How is this treated? This condition is treated by making healthy lifestyle changes, such as eating healthy foods, exercising more, and reducing your alcohol intake. Your health care provider may prescribe medicine if lifestyle changes are not enough to get your blood pressure under control, and if:  Your systolic blood pressure is above 130.  Your diastolic blood pressure is above 80.  Your personal target blood pressure may vary depending on your medical conditions, your age, and other factors. Follow these instructions at home: Eating and drinking  Eat a diet that is high in fiber and potassium, and low in sodium, added sugar, and fat. An example eating plan is called the DASH (Dietary Approaches to Stop Hypertension) diet. To eat this way: ? Eat plenty of  fresh  fruits and vegetables. Try to fill half of your plate at each meal with fruits and vegetables. ? Eat whole grains, such as whole wheat pasta, brown rice, or whole grain bread. Fill about one quarter of your plate with whole grains. ? Eat or drink low-fat dairy products, such as skim milk or low-fat yogurt. ? Avoid fatty cuts of meat, processed or cured meats, and poultry with skin. Fill about one quarter of your plate with lean proteins, such as fish, chicken without skin, beans, eggs, and tofu. ? Avoid premade and processed foods. These tend to be higher in sodium, added sugar, and fat.  Reduce your daily sodium intake. Most people with hypertension should eat less than 1,500 mg of sodium a day.  Limit alcohol intake to no more than 1 drink a day for nonpregnant women and 2 drinks a day for men. One drink equals 12 oz of beer, 5 oz of wine, or 1 oz of hard liquor. Lifestyle  Work with your health care provider to maintain a healthy body weight or to lose weight. Ask what an ideal weight is for you.  Get at least 30 minutes of exercise that causes your heart to beat faster (aerobic exercise) most days of the week. Activities may include walking, swimming, or biking.  Include exercise to strengthen your muscles (resistance exercise), such as pilates or lifting weights, as part of your weekly exercise routine. Try to do these types of exercises for 30 minutes at least 3 days a week.  Do not use any products that contain nicotine or tobacco, such as cigarettes and e-cigarettes. If you need help quitting, ask your health care provider.  Monitor your blood pressure at home as told by your health care provider.  Keep all follow-up visits as told by your health care provider. This is important. Medicines  Take over-the-counter and prescription medicines only as told by your health care provider. Follow directions carefully. Blood pressure medicines must be taken as prescribed.  Do not skip doses  of blood pressure medicine. Doing this puts you at risk for problems and can make the medicine less effective.  Ask your health care provider about side effects or reactions to medicines that you should watch for. Contact a health care provider if:  You think you are having a reaction to a medicine you are taking.  You have headaches that keep coming back (recurring).  You feel dizzy.  You have swelling in your ankles.  You have trouble with your vision. Get help right away if:  You develop a severe headache or confusion.  You have unusual weakness or numbness.  You feel faint.  You have severe pain in your chest or abdomen.  You vomit repeatedly.  You have trouble breathing. Summary  Hypertension is when the force of blood pumping through your arteries is too strong. If this condition is not controlled, it may put you at risk for serious complications.  Your personal target blood pressure may vary depending on your medical conditions, your age, and other factors. For most people, a normal blood pressure is less than 120/80.  Hypertension is treated with lifestyle changes, medicines, or a combination of both. Lifestyle changes include weight loss, eating a healthy, low-sodium diet, exercising more, and limiting alcohol. This information is not intended to replace advice given to you by your health care provider. Make sure you discuss any questions you have with your health care provider. Document Released: 11/14/2005 Document Revised: 10/12/2016 Document Reviewed:  10/12/2016 Elsevier Interactive Patient Education  [employer].

## 2019-02-14 ENCOUNTER — Other Ambulatory Visit: Payer: Self-pay

## 2019-02-15 ENCOUNTER — Other Ambulatory Visit: Payer: Self-pay

## 2019-02-18 ENCOUNTER — Other Ambulatory Visit: Payer: Self-pay

## 2019-02-18 ENCOUNTER — Encounter: Payer: Self-pay | Admitting: Women's Health

## 2019-02-18 ENCOUNTER — Ambulatory Visit (INDEPENDENT_AMBULATORY_CARE_PROVIDER_SITE_OTHER): Payer: No Typology Code available for payment source | Admitting: Women's Health

## 2019-02-18 VITALS — BP 128/80 | Ht 64.0 in | Wt 189.0 lb

## 2019-02-18 DIAGNOSIS — Z01419 Encounter for gynecological examination (general) (routine) without abnormal findings: Secondary | ICD-10-CM | POA: Diagnosis not present

## 2019-02-18 DIAGNOSIS — Z13820 Encounter for screening for osteoporosis: Secondary | ICD-10-CM

## 2019-02-18 NOTE — Addendum Note (Signed)
Addended by: Lorine Bears on: 02/18/2019 12:38 PM   Modules accepted: Orders

## 2019-02-18 NOTE — Patient Instructions (Addendum)
Vit D3 2000 iu daily Health Maintenance for Postmenopausal Women Menopause is a normal process in which your reproductive ability comes to an end. This process happens gradually over a span of months to years, usually between the ages of 21 and 68. Menopause is complete when you have missed 12 consecutive menstrual periods. It is important to talk with your health care provider about some of the most common conditions that affect postmenopausal women, such as heart disease, cancer, and bone loss (osteoporosis). Adopting a healthy lifestyle and getting preventive care can help to promote your health and wellness. Those actions can also lower your chances of developing some of these common conditions. What should I know about menopause? During menopause, you may experience a number of symptoms, such as:  Moderate-to-severe hot flashes.  Night sweats.  Decrease in sex drive.  Mood swings.  Headaches.  Tiredness.  Irritability.  Memory problems.  Insomnia. Choosing to treat or not to treat menopausal changes is an individual decision that you make with your health care provider. What should I know about hormone replacement therapy and supplements? Hormone therapy products are effective for treating symptoms that are associated with menopause, such as hot flashes and night sweats. Hormone replacement carries certain risks, especially as you become older. If you are thinking about using estrogen or estrogen with progestin treatments, discuss the benefits and risks with your health care provider. What should I know about heart disease and stroke? Heart disease, heart attack, and stroke become more likely as you age. This may be due, in part, to the hormonal changes that your body experiences during menopause. These can affect how your body processes dietary fats, triglycerides, and cholesterol. Heart attack and stroke are both medical emergencies. There are many things that you can do to help  prevent heart disease and stroke:  Have your blood pressure checked at least every 1-2 years. High blood pressure causes heart disease and increases the risk of stroke.  If you are 16-42 years old, ask your health care provider if you should take aspirin to prevent a heart attack or a stroke.  Do not use any tobacco products, including cigarettes, chewing tobacco, or electronic cigarettes. If you need help quitting, ask your health care provider.  It is important to eat a healthy diet and maintain a healthy weight. ? Be sure to include plenty of vegetables, fruits, low-fat dairy products, and lean protein. ? Avoid eating foods that are high in solid fats, added sugars, or salt (sodium).  Get regular exercise. This is one of the most important things that you can do for your health. ? Try to exercise for at least 150 minutes each week. The type of exercise that you do should increase your heart rate and make you sweat. This is known as moderate-intensity exercise. ? Try to do strengthening exercises at least twice each week. Do these in addition to the moderate-intensity exercise.  Know your numbers.Ask your health care provider to check your cholesterol and your blood glucose. Continue to have your blood tested as directed by your health care provider.  What should I know about cancer screening? There are several types of cancer. Take the following steps to reduce your risk and to catch any cancer development as early as possible. Breast Cancer  Practice breast self-awareness. ? This means understanding how your breasts normally appear and feel. ? It also means doing regular breast self-exams. Let your health care provider know about any changes, no matter how small.  If you are 79 or older, have a clinician do a breast exam (clinical breast exam or CBE) every year. Depending on your age, family history, and medical history, it may be recommended that you also have a yearly breast X-ray  (mammogram).  If you have a family history of breast cancer, talk with your health care provider about genetic screening.  If you are at high risk for breast cancer, talk with your health care provider about having an MRI and a mammogram every year.  Breast cancer (BRCA) gene test is recommended for women who have family members with BRCA-related cancers. Results of the assessment will determine the need for genetic counseling and BRCA1 and for BRCA2 testing. BRCA-related cancers include these types: ? Breast. This occurs in males or females. ? Ovarian. ? Tubal. This may also be called fallopian tube cancer. ? Cancer of the abdominal or pelvic lining (peritoneal cancer). ? Prostate. ? Pancreatic. Cervical, Uterine, and Ovarian Cancer Your health care provider may recommend that you be screened regularly for cancer of the pelvic organs. These include your ovaries, uterus, and vagina. This screening involves a pelvic exam, which includes checking for microscopic changes to the surface of your cervix (Pap test).  For women ages 21-65, health care providers may recommend a pelvic exam and a Pap test every three years. For women ages 65-65, they may recommend the Pap test and pelvic exam, combined with testing for human papilloma virus (HPV), every five years. Some types of HPV increase your risk of cervical cancer. Testing for HPV may also be done on women of any age who have unclear Pap test results.  Other health care providers may not recommend any screening for nonpregnant women who are considered low risk for pelvic cancer and have no symptoms. Ask your health care provider if a screening pelvic exam is right for you.  If you have had past treatment for cervical cancer or a condition that could lead to cancer, you need Pap tests and screening for cancer for at least 20 years after your treatment. If Pap tests have been discontinued for you, your risk factors (such as having a new sexual partner)  need to be reassessed to determine if you should start having screenings again. Some women have medical problems that increase the chance of getting cervical cancer. In these cases, your health care provider may recommend that you have screening and Pap tests more often.  If you have a family history of uterine cancer or ovarian cancer, talk with your health care provider about genetic screening.  If you have vaginal bleeding after reaching menopause, tell your health care provider.  There are currently no reliable tests available to screen for ovarian cancer. Lung Cancer Lung cancer screening is recommended for adults 21-73 years old who are at high risk for lung cancer because of a history of smoking. A yearly low-dose CT scan of the lungs is recommended if you:  Currently smoke.  Have a history of at least 30 pack-years of smoking and you currently smoke or have quit within the past 15 years. A pack-year is smoking an average of one pack of cigarettes per day for one year. Yearly screening should:  Continue until it has been 15 years since you quit.  Stop if you develop a health problem that would prevent you from having lung cancer treatment. Colorectal Cancer  This type of cancer can be detected and can often be prevented.  Routine colorectal cancer screening usually begins at  age 56 and continues through age 42.  If you have risk factors for colon cancer, your health care provider may recommend that you be screened at an earlier age.  If you have a family history of colorectal cancer, talk with your health care provider about genetic screening.  Your health care provider may also recommend using home test kits to check for hidden blood in your stool.  A small camera at the end of a tube can be used to examine your colon directly (sigmoidoscopy or colonoscopy). This is done to check for the earliest forms of colorectal cancer.  Direct examination of the colon should be repeated  every 5-10 years until age 20. However, if early forms of precancerous polyps or small growths are found or if you have a family history or genetic risk for colorectal cancer, you may need to be screened more often. Skin Cancer  Check your skin from head to toe regularly.  Monitor any moles. Be sure to tell your health care provider: ? About any new moles or changes in moles, especially if there is a change in a mole's shape or color. ? If you have a mole that is larger than the size of a pencil eraser.  If any of your family members has a history of skin cancer, especially at a young age, talk with your health care provider about genetic screening.  Always use sunscreen. Apply sunscreen liberally and repeatedly throughout the day.  Whenever you are outside, protect yourself by wearing long sleeves, pants, a wide-brimmed hat, and sunglasses. What should I know about osteoporosis? Osteoporosis is a condition in which bone destruction happens more quickly than new bone creation. After menopause, you may be at an increased risk for osteoporosis. To help prevent osteoporosis or the bone fractures that can happen because of osteoporosis, the following is recommended:  If you are 50-59 years old, get at least 1,000 mg of calcium and at least 600 mg of vitamin D per day.  If you are older than age 10 but younger than age 60, get at least 1,200 mg of calcium and at least 600 mg of vitamin D per day.  If you are older than age 8, get at least 1,200 mg of calcium and at least 800 mg of vitamin D per day. Smoking and excessive alcohol intake increase the risk of osteoporosis. Eat foods that are rich in calcium and vitamin D, and do weight-bearing exercises several times each week as directed by your health care provider. What should I know about how menopause affects my mental health? Depression may occur at any age, but it is more common as you become older. Common symptoms of depression include:   Low or sad mood.  Changes in sleep patterns.  Changes in appetite or eating patterns.  Feeling an overall lack of motivation or enjoyment of activities that you previously enjoyed.  Frequent crying spells. Talk with your health care provider if you think that you are experiencing depression. What should I know about immunizations? It is important that you get and maintain your immunizations. These include:  Tetanus, diphtheria, and pertussis (Tdap) booster vaccine.  Influenza every year before the flu season begins.  Pneumonia vaccine.  Shingles vaccine. Your health care provider may also recommend other immunizations. This information is not intended to replace advice given to you by your health care provider. Make sure you discuss any questions you have with your health care provider. Document Released: 01/06/2006 Document Revised: 06/03/2016 Document Reviewed:  08/18/2015 Elsevier Interactive Patient Education  2019 [employer]  

## 2019-02-18 NOTE — Progress Notes (Signed)
Kimberly Hall September 23, 1961 [id number]    History:    Presents for annual exam.  Postmenopausal on no HRT with no bleeding.  Normal Pap and mammogram history.  2016- colonoscopy, diverticuli noted.  2017 T score -1.2 FRAX 5.5% / 0.3%.  Labs done at work, reports as normal.  Past medical history, past surgical history, family history and social history were all reviewed and documented in the EPIC chart.  Works at [employer].  2 children both doing well.  Originally from [place].  ROS:  A ROS was performed and pertinent positives and negatives are included.  Exam:  Vitals:   02/18/19 1150  BP: 128/80  Weight: 189 lb (85.7 kg)  Height: 5\' 4"  (1.626 m)   Body mass index is 32.44 kg/m.   General appearance:  Normal Thyroid:  Symmetrical, normal in size, without palpable masses or nodularity. Respiratory  Auscultation:  Clear without wheezing or rhonchi Cardiovascular  Auscultation:  Regular rate, without rubs, murmurs or gallops  Edema/varicosities:  Not grossly evident Abdominal  Soft,nontender, without masses, guarding or rebound.  Liver/spleen:  No organomegaly noted  Hernia:  None appreciated  Skin  Inspection:  Grossly normal   Breasts: Examined lying and sitting.     Right: Without masses, retractions, discharge or axillary adenopathy.     Left: Without masses, retractions, discharge or axillary adenopathy. Gentitourinary   Inguinal/mons:  Normal without inguinal adenopathy  External genitalia:  Normal  BUS/Urethra/Skene's glands:  Normal  Vagina:  Normal  Cervix:  Normal  Uterus:   normal in size, shape and contour.  Midline and mobile  Adnexa/parametria:     Rt: Without masses or tenderness.   Lt: Without masses or tenderness.  Anus and perineum: Normal  Digital rectal exam: Normal sphincter tone without palpated masses or tenderness  Assessment/Plan:  58 y.o. MWF G2 P2 for annual exam with no complaints.  Postmenopausal/no HRT/no bleeding Osteopenia without  elevated FRAX Obesity  Plan: SBEs, reviewed importance of annual screening mammogram overdue instructed to schedule, exercise, calcium rich foods, vitamin D 2000 daily encouraged.  Home safety, fall prevention and importance of weightbearing and balance exercise reviewed.  Repeat DEXA.  Will schedule.  Pap with HR HPV typing, new screening guidelines reviewed.   Stuart, 11:58 AM 02/18/2019

## 2019-02-19 LAB — PAP, TP IMAGING W/ HPV RNA, RFLX HPV TYPE 16,18/45: HPV DNA High Risk: NOT DETECTED

## 2019-04-25 ENCOUNTER — Encounter: Payer: Self-pay | Admitting: Registered Nurse

## 2019-04-25 ENCOUNTER — Telehealth: Payer: Self-pay | Admitting: Registered Nurse

## 2019-04-25 NOTE — Telephone Encounter (Signed)
Patient requested refill ibuprofen 800mg  po TID prn knee pain #30 RF0 dispensed from [facility] to patient.  Last labs and appt with [facility] Dec 2019.  Kidney and renal function stable.  Patient to follow up with [facility] as scheduled.  Patient verbalized understanding information/instructions, agreed with plan of care and had no further questions at this time.

## 2019-05-13 ENCOUNTER — Other Ambulatory Visit: Payer: Self-pay

## 2019-05-14 ENCOUNTER — Other Ambulatory Visit: Payer: Self-pay | Admitting: Women's Health

## 2019-05-14 ENCOUNTER — Ambulatory Visit (INDEPENDENT_AMBULATORY_CARE_PROVIDER_SITE_OTHER): Payer: No Typology Code available for payment source

## 2019-05-14 DIAGNOSIS — Z780 Asymptomatic menopausal state: Secondary | ICD-10-CM

## 2019-05-14 DIAGNOSIS — Z13820 Encounter for screening for osteoporosis: Secondary | ICD-10-CM | POA: Diagnosis not present

## 2019-08-01 ENCOUNTER — Encounter: Payer: Self-pay | Admitting: Registered Nurse

## 2019-08-01 ENCOUNTER — Other Ambulatory Visit: Payer: Self-pay

## 2019-08-01 ENCOUNTER — Ambulatory Visit: Payer: Self-pay | Admitting: Registered Nurse

## 2019-08-01 VITALS — BP 138/94 | HR 85 | Temp 97.4°F

## 2019-08-01 DIAGNOSIS — R030 Elevated blood-pressure reading, without diagnosis of hypertension: Secondary | ICD-10-CM

## 2019-08-01 DIAGNOSIS — S76019A Strain of muscle, fascia and tendon of unspecified hip, initial encounter: Secondary | ICD-10-CM

## 2019-08-01 DIAGNOSIS — R1032 Left lower quadrant pain: Secondary | ICD-10-CM

## 2019-08-01 DIAGNOSIS — S29012A Strain of muscle and tendon of back wall of thorax, initial encounter: Secondary | ICD-10-CM

## 2019-08-01 MED ORDER — ACETAMINOPHEN 500 MG PO TABS: 1000.0000 mg | ORAL_TABLET | Freq: Four times a day (QID) | ORAL | 0 refills | Status: AC | PRN

## 2019-08-01 MED ORDER — BIOFREEZE 4 % EX GEL: 1.0000 "application " | Freq: Four times a day (QID) | CUTANEOUS | Status: AC | PRN

## 2019-08-01 MED ORDER — IBUPROFEN 800 MG PO TABS: 800.0000 mg | ORAL_TABLET | Freq: Three times a day (TID) | ORAL | 0 refills | Status: DC | PRN

## 2019-08-01 NOTE — Patient Instructions (Addendum)
Muscle Strain A muscle strain is an injury that occurs when a muscle is stretched beyond its normal length. Usually, a small number of muscle fibers are torn when this happens. There are three types of muscle strains. First-degree strains have the least amount of muscle fiber tearing and the least amount of pain. Second-degree and third-degree strains have more tearing and pain. Usually, recovery from muscle strain takes 1-2 weeks. Complete healing normally takes 5-6 weeks. What are the causes? This condition is caused when a sudden, violent force is placed on a muscle and stretches it too far. This may occur with a fall, lifting, or sports. What increases the risk? This condition is more likely to develop in athletes and people who are physically active. What are the signs or symptoms? Symptoms of this condition include:  Pain.  Bruising.  Swelling.  Trouble using the muscle. How is this diagnosed? This condition is diagnosed based on a physical exam and your medical history. Tests may also be done, including an X-ray, ultrasound, or MRI. How is this treated? This condition is initially treated with PRICE therapy. This therapy involves:  Protecting the muscle from being injured again.  Resting the injured muscle.  Icing the injured muscle.  Applying pressure (compression) to the injured muscle. This may be done with a splint or elastic bandage.  Raising (elevating) the injured muscle. Your health care provider may also recommend medicine for pain. Follow these instructions at home: If you have a splint:  Wear the splint as told by your health care provider. Remove it only as told by your health care provider.  Loosen the splint if your fingers or toes tingle, become numb, or turn cold and blue.  Keep the splint clean.  If the splint is not waterproof: ? Do not let it get wet. ? Cover it with a watertight covering when you take a bath or a shower. Managing pain,  stiffness, and swelling   If directed, put ice on the injured area. ? If you have a removable splint, remove it as told by your health care provider. ? Put ice in a plastic bag. ? Place a towel between your skin and the bag. ? Leave the ice on for 20 minutes, 2-3 times a day.  Move your fingers or toes often to avoid stiffness and to lessen swelling.  Raise (elevate) the injured area above the level of your heart while you are sitting or lying down.  Wear an elastic bandage as told by your health care provider. Make sure that it is not too tight. General instructions  Take over-the-counter and prescription medicines only as told by your health care provider.  Restrict your activity and rest the injured muscle as told by your health care provider. Gentle movements may be allowed.  If physical therapy was prescribed, do exercises as told by your health care provider.  Do not put pressure on any part of the splint until it is fully hardened. This may take several hours.  Do not use any products that contain nicotine or tobacco, such as cigarettes and e-cigarettes. These can delay bone healing. If you need help quitting, ask your health care provider.  Ask your health care provider when it is safe to drive if you have a splint.  Keep all follow-up visits as told by your health care provider. This is important. How is this prevented?  Warm up before exercising. This helps to prevent future muscle strains. Contact a health care provider  if:  You have more pain or swelling in the injured area. Get help right away if:  You have numbness or tingling or lose a lot of strength in the injured area. Summary  A muscle strain is an injury that occurs when a muscle is stretched beyond its normal length.  This condition is caused when a sudden, violent force is placed on a muscle and stretches it too far.  This condition is initially treated with PRICE therapy, which involves protecting,  resting, icing, compressing, and elevating.  Gentle movements may be allowed. If physical therapy was prescribed, do exercises as told by your health care provider. This information is not intended to replace advice given to you by your health care provider. Make sure you discuss any questions you have with your health care provider. Document Released: 11/14/2005 Document Revised: 10/27/2017 Document Reviewed: 12/21/2016 Elsevier Patient Education  2020 [place].  Adductor Muscle Strain With Rehab Ask your health care provider which exercises are safe for you. Do exercises exactly as told by your health care provider and adjust them as directed. It is normal to feel mild stretching, pulling, tightness, or mild discomfort as you do these exercises. Stop right away if you feel sudden pain or your pain gets worse. Do not begin these exercises until told by your health care provider. Strengthening exercises These exercises build strength and endurance in your thighs. Endurance is the ability to use your muscles for a long time, even after your muscles get tired. Hip adductor isometrics  This exercise is sometimes called inner thigh squeeze. 1. Sit on a firm chair that positions your knees at about the same height as your hips. 2. Place a large ball, firm pillow, or rolled-up bath towel between your thighs. 3. Squeeze your thighs together, gradually building tension. 4. Hold for ____5/10/15______ seconds. 5. Release the tension gradually. Allow your inner thigh muscles to relax completely before you start the next repetition. Repeat ____3______ times. Complete this exercise ___3_______ times a day. Hip adduction  This exercise is sometimes called side lying straight leg raises. 1. Lie on your side so your head, shoulder, knee, and hip are in a straight line with each other. To help maintain your balance, you may put the foot of your top leg in front of the leg that is on the floor. Your left /  right leg should be on the bottom. 2. Roll your hips slightly forward so your hips are stacked directly over each other and your left / right knee is facing forward. 3. Tense the muscles of your inner thigh and lift your bottom leg 4-6 inches (10-15 cm). 4. Hold this position for ___5 week 1/10 week 2/15__week 3_____ seconds. 5. Slowly lower your leg to the starting position. 6. Allow your muscles to relax completely before you start the next repetition. Repeat _____3_____ times. Complete this exercise ____3______ times a day. Hip extension  This exercise is sometimes called prone (on your belly) straight leg raises. 1. Lie on your belly on a bed or a firm surface with a pillow under your hips. 2. Tense your buttock muscles and lift your left / right thigh off the bed. Your left / right knee can be bent or straight, but do not let your back arch. 3. Hold this position for _5/10/15_________ seconds. 4. Slowly return to the starting position. 5. Allow your muscles to relax completely before you start the next repetition. Repeat _____3_____ times. Complete this exercise ____3______ times a day. Balance  exercises These exercises improve or maintain your balance. Balance is important in preventing falls. Single-leg balance 1. Stand near a railing or by a door frame that you can hold onto as needed. 2. Stand on your left / right foot. Keep your big toe down on the floor and try to keep your arch lifted. 3. If this is too easy, you can stand with your eyes closed or stand on a pillow. 4. Hold this position for ___5/10/15_______ seconds. Repeat ____3______ times. Complete this exercise ___3_______ times a day. This information is not intended to replace advice given to you by your health care provider. Make sure you discuss any questions you have with your health care provider. Document Released: 11/14/2005 Document Revised: 03/05/2019 Document Reviewed: 08/14/2018 Elsevier Patient Education  2020  [place].  Adductor Muscle Strain  An adductor muscle strain, also called a groin strain or pull, is an injury to the muscles or tendons on the upper, inner part of the thigh. These muscles are called the adductor muscles or groin muscles. They are responsible for moving the legs across the body or pulling the legs together. A muscle strain occurs when a muscle is overstretched and some muscle fibers are torn. An adductor muscle strain can range from mild to severe, depending on how many muscle fibers are affected and whether the muscle fibers are partially or completely torn. What are the causes? Adductor muscle strains usually occur during exercise or while participating in sports. The injury often happens when a sudden, violent force is placed on a muscle, stretching the muscle too far. A strain is more likely to happen when your muscles are not warmed up or if you are not properly conditioned. This injury may be caused by:  Stretching the adductor muscles too far or too suddenly, often during side-to-side motion with a sudden change in direction.  Putting repeated stress on the adductor muscles over a long period of time.  Performing vigorous activity without properly stretching the adductor muscles beforehand. What are the signs or symptoms? Symptoms of this condition include:  Pain and tenderness in the groin area. This begins as sharp pain and persists as a dull ache.  A popping or snapping feeling when the injury occurs (for severe strains).  Swelling or bruising.  Muscle spasms.  Weakness in the leg.  Stiffness in the groin area with decreased ability to move the affected muscles. How is this diagnosed? This condition may be diagnosed based on:  Your symptoms and a description of how the injury occurred.  A physical exam.  Imaging tests, such as: ? X-rays. These are sometimes needed to rule out a broken bone or cartilage problems. ? An ultrasound, CT scan, or MRI.  These may be done if your health care provider suspects a complete muscle tear or needs to check for other injuries. How is this treated? An adductor strain will often heal on its own. If needed, this condition may be treated with:  PRICE therapy. PRICE stands for protection of the injured area, rest, ice, pressure (compression), and elevation.  Medicines to help manage pain and swelling (anti-inflammatory medicines).  Crutches. You may be directed to use these for the first few days to minimize your pain. Depending on the severity of the muscle strain, recovery time may vary from a few weeks to several months. Severe injuries often require 4-6 weeks for recovery. In those cases, complete healing can take 4-5 months. Follow these instructions at home: [place]  the muscle from being injured again.  Rest. Do not use the strained muscle if it causes pain.  If directed, put ice on the injured area: ? Put ice in a plastic bag. ? Place a towel between your skin and the bag. ? Leave the ice on for 20 minutes, 2-3 times a day. Do this for the first 2 days after the injury.  Apply compression by wrapping the injured area with an elastic bandage as told by your health care provider.  Raise (elevate) the injured area above the level of your heart while you are sitting or lying down. General instructions  Take over-the-counter and prescription medicines only as told by your health care provider.  Walk, stretch, and do exercises as told by your health care provider. Only do these activities if you can do so without any pain.  Follow your treatment plan as told by your health care provider. This may include: ? Physical therapy. ? Massage. ? Local electrical stimulation (transcutaneous electrical nerve stimulation, TENS). How is this prevented?  Warm up and stretch before being active.  Cool down and stretch after being active.  Give your body time to rest between periods of  activity.  Make sure to use equipment that fits you.  Be safe and responsible while being active to avoid slips and falls.  Maintain physical fitness, including: ? Proper conditioning in the adductor muscles. ? Overall strength, flexibility, and endurance. Contact a health care provider if:  You have increased pain or swelling in the affected area.  Your symptoms are not improving or they are getting worse. Summary  An adductor muscle strain, also called a groin strain or pull, is an injury to the muscles or tendons on the upper, inner part of the thigh.  A muscle strain occurs when a muscle is overstretched and some muscle fibers are torn.  Depending on the severity of the muscle strain, recovery time may vary from a few weeks to several months. This information is not intended to replace advice given to you by your health care provider. Make sure you discuss any questions you have with your health care provider. Document Released: 07/12/2004 Document Revised: 03/05/2019 Document Reviewed: 04/16/2018 Elsevier Patient Education  2020 [place].  Inguinal Hernia, Adult An inguinal hernia is when fat or your intestines push through a weak spot in a muscle where your leg meets your lower belly (groin). This causes a rounded lump (bulge). This kind of hernia could also be:  In your scrotum, if you are female.  In folds of skin around your vagina, if you are female. There are three types of inguinal hernias. These include:  Hernias that can be pushed back into the belly (are reducible). This type rarely causes pain.  Hernias that cannot be pushed back into the belly (are incarcerated).  Hernias that cannot be pushed back into the belly and lose their blood supply (are strangulated). This type needs emergency surgery. If you do not have symptoms, you may not need treatment. If you have symptoms or a large hernia, you may need surgery. Follow these instructions at home: Lifestyle   Do these things if told by your doctor so you do not have trouble pooping (constipation): ? Drink enough fluid to keep your pee (urine) pale yellow. ? Eat foods that have a lot of fiber. These include fresh fruits and vegetables, whole grains, and beans. ? Limit foods that are high in fat and processed sugars. These include foods that are fried  or sweet. ? Take medicine for trouble pooping.  Avoid lifting heavy objects.  Avoid standing for long amounts of time.  Do not use any products that contain nicotine or tobacco. These include cigarettes and e-cigarettes. If you need help quitting, ask your doctor.  Stay at a healthy weight. General instructions  You may try to push your hernia in by very gently pressing on it when you are lying down. Do not try to force the bulge back in if it will not push in easily.  Watch your hernia for any changes in shape, size, or color. Tell your doctor if you see any changes.  Take over-the-counter and prescription medicines only as told by your doctor.  Keep all follow-up visits as told by your doctor. This is important. Contact a doctor if:  You have a fever.  You have new symptoms.  Your symptoms get worse. Get help right away if:  The area where your leg meets your lower belly has: ? Pain that gets worse suddenly. ? A bulge that gets bigger suddenly, and it does not get smaller after that. ? A bulge that turns red or purple. ? A bulge that is painful when you touch it.  You are a man, and your scrotum: ? Suddenly feels painful. ? Suddenly changes in size.  You cannot push the hernia in by very gently pressing on it when you are lying down. Do not try to force the bulge back in if it will not push in easily.  You feel sick to your stomach (nauseous), and that feeling does not go away.  You throw up (vomit), and that keeps happening.  You have a fast heartbeat.  You cannot poop (have a bowel movement) or pass gas. These symptoms  may be an emergency. Do not wait to see if the symptoms will go away. Get medical help right away. Call your local emergency services (911 in the [place] Summary  An inguinal hernia is when fat or your intestines push through a weak spot in a muscle where your leg meets your lower belly (groin). This causes a rounded lump (bulge).  If you do not have symptoms, you may not need treatment. If you have symptoms or a large hernia, you may need surgery.  Avoid lifting heavy objects. Also avoid standing for long amounts of time.  Do not try to force the bulge back in if it will not push in easily. This information is not intended to replace advice given to you by your health care provider. Make sure you discuss any questions you have with your health care provider. Document Released: 12/15/2006 Document Revised: 12/16/2017 Document Reviewed: 08/16/2017 Elsevier Patient Education  [place]. Abdominal Pain, Adult  Many things can cause belly (abdominal) pain. Most times, belly pain is not dangerous. Many cases of belly pain can be watched and treated at home. Sometimes belly pain is serious, though. Your doctor will try to find the cause of your belly pain. Follow these instructions at home:  Take over-the-counter and prescription medicines only as told by your doctor. Do not take medicines that help you poop (laxatives) unless told to by your doctor.  Drink enough fluid to keep your pee (urine) clear or pale yellow.  Watch your belly pain for any changes.  Keep all follow-up visits as told by your doctor. This is important. Contact a doctor if:  Your belly pain changes or gets worse.  You are not hungry, or you lose weight without  trying.  You are having trouble pooping (constipated) or have watery poop (diarrhea) for more than 2-3 days.  You have pain when you pee or poop.  Your belly pain wakes you up at night.  Your pain gets worse with meals, after eating, or with certain  foods.  You are throwing up and cannot keep anything down.  You have a fever. Get help right away if:  Your pain does not go away as soon as your doctor says it should.  You cannot stop throwing up.  Your pain is only in areas of your belly, such as the right side or the left lower part of the belly.  You have bloody or black poop, or poop that looks like tar.  You have very bad pain, cramping, or bloating in your belly.  You have signs of not having enough fluid or water in your body (dehydration), such as: ? Dark pee, very little pee, or no pee. ? Cracked lips. ? Dry mouth. ? Sunken eyes. ? Sleepiness. ? Weakness. This information is not intended to replace advice given to you by your health care provider. Make sure you discuss any questions you have with your health care provider. Document Released: 05/02/2008 Document Revised: 06/03/2016 Document Reviewed: 04/27/2016 Elsevier Interactive Patient Education  [place]. Nonspecific Chest Pain, Adult Chest pain can be caused by many different conditions. It can be caused by a condition that is life-threatening and requires treatment right away. It can also be caused by something that is not life-threatening. If you have chest pain, it can be hard to know the difference, so it is important to get help right away to make sure that you do not have a serious condition. Some life-threatening causes of chest pain include:  Heart attack.  A tear in the body's main blood vessel (aortic dissection).  Inflammation around your heart (pericarditis).  A problem in the lungs, such as a blood clot (pulmonary embolism) or a collapsed lung (pneumothorax). Some non life-threatening causes of chest pain include:  Heartburn.  Anxiety or stress.  Damage to the bones, muscles, and cartilage that make up your chest wall.  Pneumonia or bronchitis.  Shingles infection (varicella-zoster virus). Chest pain can feel like:  Pain or  discomfort on the surface of your chest or deep in your chest.  Crushing, pressure, aching, or squeezing pain.  Burning or tingling.  Dull or sharp pain that is worse when you move, cough, or take a deep breath.  Pain or discomfort that is also felt in your back, neck, jaw, shoulder, or arm, or pain that spreads to any of these areas. Your chest pain may come and go. It may also be constant. Your health care provider will do lab tests and other studies to find the cause of your pain. Treatment will depend on the cause of your chest pain. Follow these instructions at home: Medicines  Take over-the-counter and prescription medicines only as told by your health care provider.  If you were prescribed an antibiotic, take it as told by your health care provider. Do not stop taking the antibiotic even if you start to feel better. Lifestyle   Rest as directed by your health care provider.  Do not use any products that contain nicotine or tobacco, such as cigarettes and e-cigarettes. If you need help quitting, ask your health care provider.  Do not drink alcohol.  Make healthy lifestyle choices as recommended. These may include: ? Getting regular exercise. Ask your health  care provider to suggest some activities that are safe for you. ? Eating a heart-healthy diet. This includes plenty of fresh fruits and vegetables, whole grains, low-fat (lean) protein, and low-fat dairy products. A dietitian can help you find healthy eating options. ? Maintaining a healthy weight. ? Managing any other health conditions you have, such as high blood pressure (hypertension) or diabetes. ? Reducing stress, such as with yoga or relaxation techniques. General instructions  Pay attention to any changes in your symptoms. Tell your health care provider about them or any new symptoms.  Avoid any activities that cause chest pain.  Keep all follow-up visits as told by your health care provider. This is important.  This includes visits for any further testing if your chest pain does not go away. Contact a health care provider if:  Your chest pain does not go away.  You feel depressed.  You have a fever. Get help right away if:  Your chest pain gets worse.  You have a cough that gets worse, or you cough up blood.  You have severe pain in your abdomen.  You faint.  You have sudden, unexplained chest discomfort.  You have sudden, unexplained discomfort in your arms, back, neck, or jaw.  You have shortness of breath at any time.  You suddenly start to sweat, or your skin gets clammy.  You feel nausea or you vomit.  You suddenly feel lightheaded or dizzy.  You have severe weakness, or unexplained weakness or fatigue.  Your heart begins to beat quickly, or it feels like it is skipping beats. These symptoms may represent a serious problem that is an emergency. Do not wait to see if the symptoms will go away. Get medical help right away. Call your local emergency services (911 in the [place] Do not drive yourself to the hospital. Summary  Chest pain can be caused by a condition that is serious and requires urgent treatment. It may also be caused by something that is not life-threatening.  If you have chest pain, it is very important to see your health care provider. Your health care provider may do lab tests and other studies to find the cause of your pain.  Follow your health care provider's instructions on taking medicines, making lifestyle changes, and getting emergency treatment if symptoms become worse.  Keep all follow-up visits as told by your health care provider. This includes visits for any further testing if your chest pain does not go away. This information is not intended to replace advice given to you by your health care provider. Make sure you discuss any questions you have with your health care provider. Document Released: 08/24/2005 Document Revised: 05/17/2018 Document  Reviewed: 05/17/2018 Elsevier Patient Education  2020 [employer].

## 2019-08-01 NOTE — Progress Notes (Signed)
Subjective:    Patient ID: Kimberly Hall, female    DOB: 1961/11/27, 58 y.o.   MRN: [id number]  58y/o Caucasian female pt c/o L sided chest pain x1 week points to mid left pectoralis to below left breast/lateral chest. States pain is worse with movement of her left arm/lifting and deep breath. Denies cough, chest or nasal congestion, trauma, recent URI sx.  Reported she has been working in her same dept during covid pandemic; no new exercise or home/hobby activities.  Did lift a lot of plates last week.   Also with c/o pain to abdomen LLQ/ L groin. States it has progessively worsened over past 2 weeks. Denies bulge, knot noted in area. Normal BM today denied n/v/d/blood in stool/her normal diverticulitis symptoms.  Has more frequent colonoscopies due to diverticulitis.  Patient stated her GI doctor told her she has a difficulty colon  Worsens with squatting.  Denied history of hernias.  Ran out of motrin she typically uses prn for plantar fasciitis.  She reported feet have been better lately.     Review of Systems  Constitutional: Negative for activity change, appetite change, chills, diaphoresis, fatigue and fever.  HENT: Negative for trouble swallowing and voice change.   Eyes: Negative for photophobia and visual disturbance.  Respiratory: Negative for cough, choking, chest tightness, shortness of breath, wheezing and stridor.   Cardiovascular: Negative for palpitations and leg swelling.  Gastrointestinal: Positive for abdominal pain. Negative for abdominal distention, blood in stool, constipation, diarrhea, nausea, rectal pain and vomiting.  Endocrine: Negative for cold intolerance and heat intolerance.  Genitourinary: Negative for difficulty urinating.  Musculoskeletal: Positive for myalgias. Negative for arthralgias, back pain, gait problem, joint swelling, neck pain and neck stiffness.  Skin: Negative for color change, pallor, rash and wound.  Neurological: Negative for dizziness,  tremors, seizures, syncope, facial asymmetry, speech difficulty, light-headedness, numbness and headaches.  Hematological: Negative for adenopathy. Does not bruise/bleed easily.  Psychiatric/Behavioral: Negative for agitation, confusion and sleep disturbance.       Objective:   Physical Exam Vitals signs and nursing note reviewed.  Constitutional:      General: She is awake. She is not in acute distress.    Appearance: Normal appearance. She is well-developed and well-groomed. She is not ill-appearing, toxic-appearing or diaphoretic.  HENT:     Head: Normocephalic and atraumatic.     Jaw: There is normal jaw occlusion.     Right Ear: Hearing and external ear normal.     Left Ear: Hearing and external ear normal.     Nose: Nose normal.     Mouth/Throat:     Mouth: Mucous membranes are moist.     Pharynx: Oropharynx is clear. Uvula midline.  Eyes:     General: Lids are normal. Vision grossly intact. Gaze aligned appropriately. No visual field deficit.    Extraocular Movements: Extraocular movements intact.     Conjunctiva/sclera: Conjunctivae normal.     Pupils: Pupils are equal, round, and reactive to light.  Neck:     Musculoskeletal: Normal range of motion and neck supple. Normal range of motion. No edema, erythema, neck rigidity, crepitus, injury, pain with movement, torticollis or muscular tenderness.     Thyroid: No thyromegaly.     Trachea: Trachea and phonation normal. No tracheal deviation.  Cardiovascular:     Rate and Rhythm: Normal rate and regular rhythm.     Pulses:          Radial pulses are 2+ on the right side  and 2+ on the left side.     Heart sounds: Normal heart sounds. Heart sounds not distant. No murmur.  Pulmonary:     Effort: Pulmonary effort is normal. No tachypnea, accessory muscle usage or respiratory distress.     Breath sounds: Normal breath sounds and air entry. No stridor, decreased air movement or transmitted upper airway sounds. No decreased breath  sounds, wheezing, rhonchi or rales.     Comments: Wearing cloth face mask due to covid 19 pandemic; no cough observed in exam room; spoke full sentences without difficulty Chest:     Chest wall: Tenderness present. No mass, lacerations, deformity, swelling, crepitus or edema. There is no dullness to percussion.    Abdominal:     General: Abdomen is flat. Bowel sounds are decreased. There is no distension or abdominal bruit. There are no signs of injury.     Palpations: Abdomen is soft. There is no shifting dullness, fluid wave, hepatomegaly, splenomegaly, mass or pulsatile mass.     Tenderness: There is abdominal tenderness in the left lower quadrant. There is no right CVA tenderness, left CVA tenderness, guarding or rebound. Negative signs include Murphy's sign.     Hernia: A hernia is present. Hernia is present in the left inguinal area. There is no hernia in the umbilical area, right femoral area, left femoral area or right inguinal area.       Comments: With abdominal crunch bulge 2cm noted LLQ/lateral inguinal resolves at rest; very mildly tender with abdominal crunch; dull to percussion x 4 quads and hypoactive bowel sounds  Musculoskeletal: Normal range of motion.        General: Tenderness present. No swelling or deformity.     Right shoulder: Normal.     Left shoulder: Normal.     Right elbow: Normal.    Left elbow: Normal.     Right hip: Normal.     Left hip: She exhibits tenderness. She exhibits normal range of motion, normal strength, no bony tenderness, no swelling and no crepitus.     Right knee: Normal.     Left knee: Normal.     Cervical back: Normal.     Thoracic back: Normal.     Lumbar back: Normal.     Right hand: Normal.     Left hand: Normal.     Right upper leg: Normal. She exhibits no tenderness, no bony tenderness, no swelling, no edema, no deformity and no laceration.     Left upper leg: She exhibits tenderness. She exhibits no bony tenderness, no swelling, no  edema, no deformity and no laceration.     Right lower leg: She exhibits no tenderness. No edema.     Left lower leg: She exhibits no tenderness. No edema.       Legs:     Comments: Left adductor TTP insertion site to midpoint; no defect palpated normal gait; on/off exam table/in/out of chair and standing to sitting to supine and reverse quickly without assist  Lymphadenopathy:     Head:     Right side of head: No submental, submandibular, tonsillar, preauricular, posterior auricular or occipital adenopathy.     Left side of head: No submental, submandibular, tonsillar, preauricular, posterior auricular or occipital adenopathy.     Cervical: No cervical adenopathy.     Right cervical: No superficial cervical adenopathy.    Left cervical: No superficial cervical adenopathy.     Upper Body:     Right upper body: No supraclavicular, axillary or pectoral  adenopathy.     Left upper body: No supraclavicular, axillary or pectoral adenopathy.  Skin:    General: Skin is warm and dry.     Capillary Refill: Capillary refill takes less than 2 seconds.     Coloration: Skin is not ashen, cyanotic, jaundiced, mottled, pale or sallow.     Findings: No abrasion, abscess, acne, bruising, burn, ecchymosis, erythema, laceration, lesion, petechiae, rash or wound.     Nails: There is no clubbing.   Neurological:     General: No focal deficit present.     Mental Status: She is alert and oriented to person, place, and time. Mental status is at baseline.     GCS: GCS eye subscore is 4. GCS verbal subscore is 5. GCS motor subscore is 6.     Cranial Nerves: Cranial nerves are intact. No cranial nerve deficit, dysarthria or facial asymmetry.     Sensory: Sensation is intact. No sensory deficit.     Motor: Motor function is intact. No weakness, tremor, atrophy, abnormal muscle tone or seizure activity.     Coordination: Coordination is intact. Coordination normal.     Gait: Gait is intact. Gait normal.      Comments: Bilateral hand grasp equal 5/5  Psychiatric:        Attention and Perception: Attention and perception normal.        Mood and Affect: Mood and affect normal. Mood is not anxious.        Speech: Speech normal.        Behavior: Behavior normal. Behavior is not agitated. Behavior is cooperative.        Thought Content: Thought content normal.        Cognition and Memory: Cognition and memory normal.        Judgment: Judgment normal.           Assessment & Plan:  A-LLQ ABDOMEN PAIN ACUTE, ADDUCTOR STRAIN ACUTE LEFT INITIAL VISIT; LEFT LATISSMUS DORSI STRAIN ACUTE INITIAL VISIT  P-Patient to monitor if bulge occurring at rest or worsening with abdominal crunch.  Discussed this is probably inguinal hernia but could be start of diverticulitis flare. Follow up if n/v/d/blood stools, worsening pain and consider imaging and referral general surgery.  Avoid weight gain, consider weight loss.  Avoid constipation.  High fiber diet recommended at least 20 grams per day.  Exitcare handout on abdomen pain and inguinal hernia printed and given to patient.  Patient verbalized understanding information/instructions, agreed with plan of care and had no further questions at this time.  Discussed adductor strain left groin acute and left latissmus dorsi, motrin 800mg  po TID prn pain take with food to avoid stomach irritation #30 RF0 dispensed from [facility] to patient.  Exitcare handout on adductor strain and rehab exercises and muscle strain and chest pain given to patient start with holding 5 seconds for each rep/exercise this week, increase to 10 seconds next week and 15 seconds week 3.  Avoid high impact activities and hold loads close to body consider breaking lifting tasks in lighter loads until feeling better.  Given biofreeze topical gel may apply up to QID 4 UD from clinic stock.  Given reusable ice pack from clinic stock may apply 15 minutes TID.  Follow up re-eval if worsening/not improving with plan  of care.  Patient verbalized understanding of information/instructions and agreed with plan of care and had no further questions at this time.

## 2019-08-02 ENCOUNTER — Ambulatory Visit: Payer: Self-pay | Admitting: *Deleted

## 2019-08-02 DIAGNOSIS — Z0000 Encounter for general adult medical examination without abnormal findings: Secondary | ICD-10-CM

## 2019-08-02 NOTE — Progress Notes (Signed)
Fasting labs for upcoming pcp annual visit.

## 2019-08-03 LAB — CMP12+LP+TP+TSH+6AC+CBC/D/PLT
ALT: 13 IU/L (ref 0–32)
AST: 17 IU/L (ref 0–40)
Albumin/Globulin Ratio: 2 (ref 1.2–2.2)
Albumin: 4.5 g/dL (ref 3.8–4.9)
Alkaline Phosphatase: 72 IU/L (ref 39–117)
BUN/Creatinine Ratio: 19 (ref 9–23)
BUN: 13 mg/dL (ref 6–24)
Basophils Absolute: 0.1 10*3/uL (ref 0.0–0.2)
Basos: 1 %
Bilirubin Total: 0.4 mg/dL (ref 0.0–1.2)
Calcium: 9.3 mg/dL (ref 8.7–10.2)
Chloride: 101 mmol/L (ref 96–106)
Chol/HDL Ratio: 2.8 ratio (ref 0.0–4.4)
Cholesterol, Total: 240 mg/dL — ABNORMAL HIGH (ref 100–199)
Creatinine, Ser: 0.67 mg/dL (ref 0.57–1.00)
EOS (ABSOLUTE): 0 10*3/uL (ref 0.0–0.4)
Eos: 1 %
Estimated CHD Risk: <0.5 times avg. (ref 0.0–1.0)
Free Thyroxine Index: 1.7 (ref 1.2–4.9)
GFR calc Af Amer: 111 mL/min/{1.73_m2} (ref >59)
GFR calc non Af Amer: 97 mL/min/{1.73_m2} (ref >59)
GGT: 15 IU/L (ref 0–60)
Globulin, Total: 2.2 g/dL (ref 1.5–4.5)
Glucose: 98 mg/dL (ref 65–99)
HDL: 86 mg/dL (ref >39)
Hematocrit: 39.8 % (ref 34.0–46.6)
Hemoglobin: 12.8 g/dL (ref 11.1–15.9)
Immature Grans (Abs): 0 10*3/uL (ref 0.0–0.1)
Immature Granulocytes: 0 %
Iron: 86 ug/dL (ref 27–159)
LDH: 218 IU/L (ref 119–226)
LDL Chol Calc (NIH): 132 mg/dL — ABNORMAL HIGH (ref 0–99)
Lymphocytes Absolute: 1.7 10*3/uL (ref 0.7–3.1)
Lymphs: 27 %
MCH: 30.1 pg (ref 26.6–33.0)
MCHC: 32.2 g/dL (ref 31.5–35.7)
MCV: 94 fL (ref 79–97)
Monocytes Absolute: 0.4 10*3/uL (ref 0.1–0.9)
Monocytes: 6 %
Neutrophils Absolute: 4.1 10*3/uL (ref 1.4–7.0)
Neutrophils: 65 %
Phosphorus: 3.5 mg/dL (ref 3.0–4.3)
Platelets: 226 10*3/uL (ref 150–450)
Potassium: 4.4 mmol/L (ref 3.5–5.2)
RBC: 4.25 x10E6/uL (ref 3.77–5.28)
RDW: 12.8 % (ref 11.7–15.4)
Sodium: 139 mmol/L (ref 134–144)
T3 Uptake Ratio: 23 % — ABNORMAL LOW (ref 24–39)
T4, Total: 7.3 ug/dL (ref 4.5–12.0)
TSH: 3.1 u[IU]/mL (ref 0.450–4.500)
Total Protein: 6.7 g/dL (ref 6.0–8.5)
Triglycerides: 129 mg/dL (ref 0–149)
Uric Acid: 3.5 mg/dL (ref 2.5–7.1)
VLDL Cholesterol Cal: 22 mg/dL (ref 5–40)
WBC: 6.2 10*3/uL (ref 3.4–10.8)

## 2019-08-03 LAB — HGB A1C W/O EAG: Hgb A1c MFr Bld: 5.3 % (ref 4.8–5.6)

## 2019-08-08 NOTE — Progress Notes (Signed)
Results reviewed with pt in clinic. Diet and exercise recommendations discussed re: elevated total chol and LDL, and per NP notes above. Routine f/u with pcp. Copy of labs provided to pt. Results routed to pcp per pt request. No further questions/concerns.

## 2019-08-09 NOTE — Progress Notes (Signed)
Noted  

## 2019-09-12 ENCOUNTER — Encounter: Payer: Self-pay | Admitting: Women's Health

## 2019-12-17 ENCOUNTER — Telehealth: Payer: Self-pay | Admitting: *Deleted

## 2019-12-17 NOTE — Telephone Encounter (Signed)
RN notified by HR manager Hyacinth Meeker of pt's son having a positive covid test late last week. Pt has not had direct exposure to her son, but her husband was in a vehicle for an extended amount of time on 12/11/19. Due to being in same household as exposed person, pt under quarantine as well. Pt denies any sx currently.  Advised pt to follow 14 day asymptomatic with exposure to positive person quarantine per CDC recommendations. Advised pt that Day 1 of quarantine is 12/12/19. Plan for testing on Day 9, date 12/20/19. Pt lives in [place]. [facility] closet testing site for pt. Appt made for 12/20/19 at 12:45p. Reviewed drive up testing instructions and directions with pt, ie attend appt time, remain in vehicle, wear mask, results back in 2-3 days. Will f/u pt on 1/25/ with anticipated results. If no sx develop and no fever in previous >24hrs without antipyretics, pt to complete quarantine 1/27 and return to work 12/26/19.    Reviewed possible Covid sx including cough, ShOB, sinusitis sx, sore throat, fever/chills, body aches, fatigue, loss of taste/smell, GI sx n/v/d. Also reviewed same day/emergent eval/ER precautions of dizziness/syncope, confusion, blue tint to lips/face, severe ShOB/difficulty breathing.    Pt verbalizes understanding and agreement with plan of care. No further questions/concerns at this time. Pt reminded to contact clinic with any changes in sx or questions/concerns.

## 2019-12-17 NOTE — Telephone Encounter (Signed)
Noted agreed with plan of care per CDC guidelines

## 2019-12-20 ENCOUNTER — Ambulatory Visit: Payer: PRIVATE HEALTH INSURANCE | Attending: Internal Medicine

## 2019-12-20 DIAGNOSIS — Z20822 Contact with and (suspected) exposure to covid-19: Secondary | ICD-10-CM

## 2019-12-21 ENCOUNTER — Encounter: Payer: Self-pay | Admitting: *Deleted

## 2019-12-21 LAB — NOVEL CORONAVIRUS, NAA: SARS-CoV-2, NAA: NOT DETECTED

## 2019-12-21 NOTE — Telephone Encounter (Signed)
Attempted to reach patient via telephone no answer left voicemail.  Covid test results were negative. Continue quarantine at home and monitoring for symptoms of covid e.g. runny nose, sore throat, headache, loss of taste/smell, nausea/vomiting/diarrhea, fever/chills, body aches, cough, shortness of breath/dyspnea.   Please notify clinic staff if symptoms develop.  RN Hildred Alamin in clinic M, Tu, Th and Fr at extension [phone] and I am available through [email or web address] or [email or web address].  Quarantine to end  12/25/2019 and return to work 12/26/2019 as long as symptom free continues and no new known covid close positive contacts.Covid spread and incidence in community high.  Continue to protect self with mask wear, hand sanitizing washing, avoid touching face and maintaining social distancing 6 feet or greater.  [employer] [facility] HR Tim notified of negative test result.

## 2019-12-23 NOTE — Telephone Encounter (Signed)
noted 

## 2019-12-23 NOTE — Telephone Encounter (Signed)
Spoke with pt over phone. She reports no sx still. Ready to come back to work. She received NP phone mesg and has no further questions or concerns. Verbalizes confirmation that she will be back at work on 1/28 post quarantine so long as she doesn't develop sx before then.

## 2019-12-29 NOTE — Telephone Encounter (Signed)
Please verify patient returned to work as planned

## 2019-12-30 NOTE — Telephone Encounter (Signed)
noted 

## 2019-12-30 NOTE — Telephone Encounter (Signed)
Confirmed with HR that pt returned to work on-site as expected on 12/26/19.

## 2020-02-24 ENCOUNTER — Ambulatory Visit: Payer: PRIVATE HEALTH INSURANCE | Attending: Internal Medicine

## 2020-02-24 DIAGNOSIS — Z23 Encounter for immunization: Secondary | ICD-10-CM

## 2020-02-24 NOTE — Progress Notes (Signed)
   Covid-19 Vaccination Clinic  Name:  Keerthana Cleaves    MRN: [id number] DOB: 05/11/1961  02/24/2020  Ms. Henckel was observed post Covid-19 immunization for 15 minutes without incident. She was provided with Vaccine Information Sheet and instruction to access the V-Safe system.   Ms. Cronin was instructed to call 911 with any severe reactions post vaccine: Marland Kitchen Difficulty breathing  . Swelling of face and throat  . A fast heartbeat  . A bad rash all over body  . Dizziness and weakness   Immunizations Administered    Name Date Dose VIS Date Route   Pfizer COVID-19 Vaccine 02/24/2020  1:44 PM 0.3 mL 11/08/2019 Intramuscular   Manufacturer: [place]   Lot: [id number]   [place]: [id number]

## 2020-03-10 ENCOUNTER — Telehealth: Payer: Self-pay | Admitting: *Deleted

## 2020-03-10 NOTE — Telephone Encounter (Signed)
Noted agreed with plan of care

## 2020-03-10 NOTE — Telephone Encounter (Signed)
Pt left work early today citing headache. While speaking with pt over phone, she reports drinking a full bottle of "green juice" this morning. This is the first time she has drank this and within an hour or so, had 2 episodes of diarrhea. She then became nauseous and developed a HA. She reports feeling better now since she has arrived home. Sts nausea still present but mild. No vomiting yet. No more episodes of diarrhea. Denies abd pain. HA still present. Advised pt to hydrate, sips of water/Gatorade/Propel every 15 min to avoid worsening nausea. Can take Tylenol OTC for HA. Reviewed BRAT diet to follow today and advance diet as tolerated tomorrow. She denies any fever, chills, body aches, loss of taste or smell, cough, congestion. No quarantine indicated at this time. Believe all sx r/t green juice drink this morning. Advised pt that due to high amount of fiber in these, to start with smaller amounts like a quarter of a bottle for a couple of days, then increase as tolerated to give her body time to adjust to increase in veggies/fiber. She is agreeable to this. Plans to RTW as scheduled on Thursday. She is already scheduled off tomorrow. Pt made aware to contact HR Hyacinth Meeker tomorrow if any new or worsening sx since clinic is closed on Wednesdays. Denies any further questions/concerns

## 2020-03-12 NOTE — Telephone Encounter (Signed)
Spoke with pt over phone. She reported that she is on-site today since she was cleared to return. She   Advised pt to follow 10 day symptomatic person quarantine per CDC recommendations. Advised pt that Day 1 of quarantine is 03/11/20. Plan for testing on Day 6, date 03/16/20. Pt scheduled for 2nd Covid vaccine on 4/20. RN called [facility] and rescheduled for next available appt per [facility]. Pt made aware that her vaccine appt was moved to 03/24/20 at 2:45pm. Pt verbalizes understanding. Pt lives in [place]. [facility] preferred testing site for pt. Appt made for 03/16/20 at 10:00a. Reviewed drive up testing instructions and directions with pt, ie attend appt time, remain in vehicle, wear mask, results back in 2-3 days. Will f/u pt on 4/22 with anticipated results. If sx improving and no fever in previous >24hrs without antipyretics, pt to complete quarantine 03/20/20 and return to work on next scheduled workday 03/23/20. Advised pt HR will contact her about benefits/PTO/EPSL.    Reviewed possible Covid sx including cough, ShOB, sinusitis sx, sore throat, fever/chills, body aches, fatigue, loss of taste/smell, GI sx n/v/d. Also reviewed same day/emergent eval/ER precautions of dizziness/syncope, confusion, blue tint to lips/face, severe ShOB/difficulty breathing.    Pt verbalizes understanding and agreement with plan of care. No further questions/concerns at this time. Pt reminded to contact clinic with any changes in sx or questions/concerns.

## 2020-03-12 NOTE — Telephone Encounter (Signed)
Per RN Hildred Alamin patient still having symptoms today therefore sent home to start quarantine and covid testing scheduled to follow CDC guidelines

## 2020-03-15 NOTE — Telephone Encounter (Signed)
Patient returned her stomach has returned to normal and denied fever/chills/loss of taste or smell, n/v/d, sore throat, nasal congestion, cough, sore throat or body aches.  Patient asking if she can change her vaccination date for #2 as has other plans for week of 27 Apr (Easter celebrations) and would like vaccine number 2 scheduled for 7 May or later.  Discussed with patient the latest administration per doses per up to date is 6 weeks/42 days.  7 May would be week 5.  Discussed with patient I would have RN Hildred Alamin check tomorrow if vaccine appts available for 7 May or later and notify her.  Patient going for covid testing tomorrow at [facility] and continuing quarantine.  Patient verbalized understanding information/instructions, agreed with plan of care and had no further questions at this time.

## 2020-03-16 ENCOUNTER — Ambulatory Visit: Payer: PRIVATE HEALTH INSURANCE | Attending: Internal Medicine

## 2020-03-16 DIAGNOSIS — Z20822 Contact with and (suspected) exposure to covid-19: Secondary | ICD-10-CM

## 2020-03-17 ENCOUNTER — Ambulatory Visit: Payer: Self-pay

## 2020-03-17 LAB — NOVEL CORONAVIRUS, NAA: SARS-CoV-2, NAA: NOT DETECTED

## 2020-03-17 LAB — SARS-COV-2, NAA 2 DAY TAT

## 2020-03-17 NOTE — Telephone Encounter (Signed)
RN spoke with [facility] center and they changed pt's 2nd vaccine appt to 04/06/20 at 10:45am. Pt made aware of same by phone. She was also given negative covid test result and reminded to continue quarantine through 4/23, RTW on 4/26 as planned. She denies any current sx, all sx resolved over the weekend. Reminded to contact clinic if she develops any new or recurring sx during duration of quarantine. Pt verbalizes understanding and agreement with plan of care. No further questions/concerns.

## 2020-03-17 NOTE — Telephone Encounter (Signed)
Noted covid vaccination rescheduled, covid test negative and patient reported symptoms resolved.  Continuing quarantine

## 2020-03-18 ENCOUNTER — Encounter: Payer: No Typology Code available for payment source | Admitting: Women's Health

## 2020-03-22 NOTE — Telephone Encounter (Signed)
Telephone message left for patient calling to ensure still feeling well and ready to return to work tomorrow and no new symptoms.  Will call again after 6pm

## 2020-03-24 ENCOUNTER — Ambulatory Visit: Payer: Self-pay

## 2020-04-01 NOTE — Telephone Encounter (Signed)
Please verify patient returned to work as planned; symptoms resolved and remind her of vaccination appt 04/06/2020 at 1045

## 2020-04-06 ENCOUNTER — Ambulatory Visit: Payer: PRIVATE HEALTH INSURANCE | Attending: Internal Medicine

## 2020-04-06 ENCOUNTER — Ambulatory Visit: Payer: Self-pay

## 2020-04-06 DIAGNOSIS — Z23 Encounter for immunization: Secondary | ICD-10-CM

## 2020-04-06 NOTE — Progress Notes (Signed)
   Covid-19 Vaccination Clinic  Name:  Kimberly Hall    MRN: [id number] DOB: Sep 25, 1961  04/06/2020  [name] was observed post Covid-19 immunization for 15 minutes without incident. She was provided with Vaccine Information Sheet and instruction to access the V-Safe system.   [name] was instructed to call 911 with any severe reactions post vaccine: Marland Kitchen Difficulty breathing  . Swelling of face and throat  . A fast heartbeat  . A bad rash all over body  . Dizziness and weakness   Immunizations Administered    Name Date Dose VIS Date Route   Pfizer COVID-19 Vaccine 04/06/2020  3:52 PM 0.3 mL 01/22/2019 Intramuscular   Manufacturer: [place]   Lot: [id number]   [place]: [id number]

## 2020-04-10 NOTE — Telephone Encounter (Signed)
Pt RTW as planned on 03/23/20. Per [facility] review, pt did receive Covid vaccine on 04/06/20.

## 2020-04-13 ENCOUNTER — Encounter: Payer: Self-pay | Admitting: *Deleted

## 2020-04-13 NOTE — Telephone Encounter (Signed)
Noted received vaccination and RTW, symptoms resolved.

## 2020-04-14 ENCOUNTER — Telehealth: Payer: Self-pay | Admitting: Registered Nurse

## 2020-04-14 MED ORDER — IBUPROFEN 800 MG PO TABS: 800.0000 mg | ORAL_TABLET | Freq: Three times a day (TID) | ORAL | 0 refills | Status: DC | PRN

## 2020-04-14 NOTE — Telephone Encounter (Signed)
Patient having chronic foot pain last fill ibuprofen 800mg  po TID prn pain 08/01/2019 #30 RF0  Has bought new shoes sketches foam with insoles but still having am and mid day pain if not taking motrin.  Tried OTC 200-500 but not strong enough.  Filled #30 Ibuprofen 800mg  po TID prn pain take with food today from [facility] and dispensed to patient follow up prn.  Patient reported at worst 8/10 foot pain; new shoes and insoles helping.  Treads not worn and shoes look new along with insoles patient wearing in clinic today.  Patient verbalized understanding information/instructions, agreed with plan of care and had no further questions at this time.

## 2020-08-20 ENCOUNTER — Encounter: Payer: Self-pay | Admitting: Registered Nurse

## 2020-08-20 ENCOUNTER — Other Ambulatory Visit: Payer: Self-pay

## 2020-08-20 ENCOUNTER — Ambulatory Visit: Payer: Self-pay | Admitting: Registered Nurse

## 2020-08-20 VITALS — BP 151/96 | HR 80 | Temp 98.1°F | Resp 12 | Ht 66.0 in | Wt 193.0 lb

## 2020-08-20 DIAGNOSIS — Z6831 Body mass index (BMI) 31.0-31.9, adult: Secondary | ICD-10-CM

## 2020-08-20 DIAGNOSIS — E7800 Pure hypercholesterolemia, unspecified: Secondary | ICD-10-CM

## 2020-08-20 DIAGNOSIS — M7989 Other specified soft tissue disorders: Secondary | ICD-10-CM

## 2020-08-20 DIAGNOSIS — R7989 Other specified abnormal findings of blood chemistry: Secondary | ICD-10-CM

## 2020-08-20 DIAGNOSIS — R030 Elevated blood-pressure reading, without diagnosis of hypertension: Secondary | ICD-10-CM

## 2020-08-20 DIAGNOSIS — R5383 Other fatigue: Secondary | ICD-10-CM

## 2020-08-20 NOTE — Progress Notes (Signed)
Subjective:    Patient ID: Kimberly Hall, female    DOB: [birth year], 60 y.o.   MRN: [id number]  59y/o established caucasian patient c/o fatigue and leg swelling during vacation and continued after trip to [place] but mainly left leg only now versus bilateral when she was in [place].  Asked patient if eating salty foods and she said on vacation was eating everything she craved.  She also requires assistance scheduling a covid test s/p international travel required by her work.  Patient denied any covid symptoms e.g. URI/GI upset/fever/chills/loss of taste/smell, body aches.  Patient reported she typically takes 30 minute nap after work and goes to bed between 23-2400 and wakes up 0500.  Typically tired after work and falls asleep while relaxing and watching TV.  Then gets up to exercise/eat dinner.  Typically eats 3 meals per day, small breakfast and bigger lunch then dinner.  Saw PCM this spring and had annual labs.  Patient denied UTI symptoms "my urine never completely normal"  Today's blood pressure higher than her normal 130/80s per patient.  Denied headache/chest pain/shortness of breath/dyspnea/visual changes.  Hasn't been taking her vitamin D supplement regularly.  Up to date on mammogram/pap/colonoscopy.  Fully covid vaccinated.  Hasn't had flu vaccine yet this year.     Review of Systems  Constitutional: Positive for fatigue. Negative for activity change, appetite change, chills, diaphoresis, fever and unexpected weight change.  HENT: Negative for congestion, ear discharge, ear pain, postnasal drip, rhinorrhea, sinus pressure, sinus pain, sneezing, sore throat, trouble swallowing and voice change.   Eyes: Negative for photophobia and visual disturbance.  Respiratory: Negative for cough, shortness of breath, wheezing and stridor.   Cardiovascular: Positive for leg swelling. Negative for chest pain and palpitations.  Gastrointestinal: Negative for abdominal pain, constipation, diarrhea,  nausea and vomiting.  Endocrine: Negative for cold intolerance and heat intolerance.  Genitourinary: Negative for difficulty urinating.  Musculoskeletal: Negative for arthralgias, back pain, gait problem, joint swelling, myalgias, neck pain and neck stiffness.  Skin: Negative for rash.  Allergic/Immunologic: Negative for food allergies.  Neurological: Negative for dizziness, tremors, seizures, syncope, facial asymmetry, speech difficulty, weakness, light-headedness, numbness and headaches.  Hematological: Negative for adenopathy. Does not bruise/bleed easily.  Psychiatric/Behavioral: Negative for agitation, confusion and sleep disturbance.       Objective:   Physical Exam Vitals and nursing note reviewed.  Constitutional:      General: She is awake. She is not in acute distress.    Appearance: Normal appearance. She is well-developed and well-groomed. She is obese. She is not ill-appearing, toxic-appearing or diaphoretic.  HENT:     Head: Normocephalic and atraumatic.     Jaw: There is normal jaw occlusion.     Salivary Glands: Right salivary gland is not diffusely enlarged. Left salivary gland is not diffusely enlarged.     Right Ear: Hearing and external ear normal.     Left Ear: Hearing and external ear normal.     Nose: Nose normal. No congestion or rhinorrhea.     Mouth/Throat:     Mouth: No angioedema.     Pharynx: Oropharynx is clear.  Eyes:     General: Lids are normal. Vision grossly intact. Gaze aligned appropriately. Allergic shiner present. No visual field deficit or scleral icterus.       Right eye: No discharge.        Left eye: No discharge.     Extraocular Movements: Extraocular movements intact.     Right eye: No nystagmus.  Left eye: No nystagmus.     Conjunctiva/sclera: Conjunctivae normal.     Pupils: Pupils are equal, round, and reactive to light.  Neck:     Thyroid: No thyromegaly.     Trachea: Trachea and phonation normal.  Cardiovascular:     Rate  and Rhythm: Normal rate and regular rhythm.     Pulses: Normal pulses.          Radial pulses are 2+ on the right side and 2+ on the left side.     Comments: 0-1+/4 left and trace right nonpitting--wearing crew socks and right with faint sock line and left with edema to 4 inches superior to sock line Pulmonary:     Effort: Pulmonary effort is normal. No respiratory distress.     Breath sounds: Normal breath sounds and air entry. No stridor, decreased air movement or transmitted upper airway sounds. No wheezing.     Comments: No cough noted in exam room; spoke full sentences without difficulty; wearing cloth mask due to covid 19 pandemic Abdominal:     Palpations: Abdomen is soft.  Musculoskeletal:        General: No tenderness, deformity or signs of injury. Normal range of motion.     Right shoulder: Normal.     Left shoulder: Normal.     Right elbow: Normal.     Left elbow: Normal.     Right hand: Normal.     Left hand: Normal.     Cervical back: Normal, normal range of motion and neck supple. No swelling, edema, deformity, erythema, signs of trauma, lacerations, rigidity or crepitus. No pain with movement. Normal range of motion.     Thoracic back: Normal. No swelling, edema, deformity, signs of trauma or lacerations.     Lumbar back: Normal. No swelling, edema, deformity, signs of trauma or lacerations. Normal range of motion.     Right hip: No deformity or lacerations. Normal range of motion.     Left hip: No deformity or lacerations. Normal range of motion.     Right knee: Normal. No swelling, deformity, effusion, erythema, ecchymosis or lacerations.     Left knee: Normal. No swelling, deformity, effusion, erythema, ecchymosis or lacerations.     Right lower leg: No swelling, deformity, lacerations, tenderness or bony tenderness. No edema.     Left lower leg: Swelling present. No deformity, lacerations, tenderness or bony tenderness. 1+ Edema present.     Right ankle: No swelling,  deformity, ecchymosis or lacerations. No tenderness. Normal range of motion.     Left ankle: No deformity, ecchymosis or lacerations. No tenderness. Normal range of motion.     Right foot: No swelling, deformity, laceration, tenderness or crepitus.     Left foot: Swelling present. No deformity, laceration, tenderness or crepitus.     Comments: Noted left foot 1+/4 nonpitting edema global compared to right and patient noted left shoe a little snug  Lymphadenopathy:     Head:     Right side of head: No submental, submandibular or preauricular adenopathy.     Left side of head: No submental, submandibular or preauricular adenopathy.     Cervical: No cervical adenopathy.     Right cervical: No superficial cervical adenopathy.    Left cervical: No superficial cervical adenopathy.  Skin:    General: Skin is warm and dry.     Capillary Refill: Capillary refill takes less than 2 seconds.     Coloration: Skin is not ashen, cyanotic, jaundiced, mottled, pale or  sallow.     Findings: No abrasion, abscess, acne, bruising, burn, ecchymosis, erythema, signs of injury, laceration, lesion, petechiae, rash or wound.     Nails: There is no clubbing.  Neurological:     General: No focal deficit present.     Mental Status: She is alert and oriented to person, place, and time. Mental status is at baseline.     GCS: GCS eye subscore is 4. GCS verbal subscore is 5. GCS motor subscore is 6.     Cranial Nerves: Cranial nerves are intact. No cranial nerve deficit, dysarthria or facial asymmetry.     Sensory: Sensation is intact.     Motor: Motor function is intact. No weakness, tremor, atrophy, abnormal muscle tone or seizure activity.     Coordination: Coordination is intact. Coordination normal.     Gait: Gait is intact. Gait normal.     Comments: Gait sure and steady in exam room; spoke full sentences without difficulty; gait sure and steady in clinic; bilateral hand grasp equal 5/5 and in/out of chair without  difficulty  Psychiatric:        Attention and Perception: Attention and perception normal.        Mood and Affect: Mood and affect normal.        Speech: Speech normal.        Behavior: Behavior normal. Behavior is cooperative.        Thought Content: Thought content normal.        Cognition and Memory: Cognition and memory normal.        Judgment: Judgment normal.    Blood Pressure 130/80 02/24/2020 9:46 AM EDT   Pulse 86 02/24/2020 9:46 AM EDT      Total Cholesterol 25 - 199 MG/DL 244High        Triglycerides 10 - 150 MG/DL 193High        HDL Cholesterol 35 - 135 MG/DL 58        LDL Cholesterol Calculated 0 - 99 MG/DL 147High        Total Chol / HDL Cholesterol <4.5  4.2        Non-HDL Cholesterol MG/DL 186  TARGET: <(LDL-C TARGET + 30)MG/DL      Coronary Heart Disease Risk Table   TOTAL CHOLESTEROL/HDL RATIO:CHD RISK  CORONARY HEART DISEASE RISK TABLE            MEN   WOMEN  1/2 AVERAGE RISK  3.4   3.3  AVERAGE RISK    5.8   4.4  2 X AVERAGE RISK  9.6   7.1  3 X AVERAGE RISK  23.4   11.0   Use the calculated Patient Ratio and the CHD Risk Table to determine the patient's CHD Risk.   ATP III Classification (LDL):  <100    mg/dl   Optimal  100-129  mg/dl   Near or Above Optimal  130-159  mg/dl   Borderline  160-189  mg/dl   High  >190    mg/dl   Very High      Resulting Agency  [facility]   Specimen Collected: 02/13/20 9:36 AM Last Resulted: 02/13/20 12:24 PM  Received From: [facility]  Result Received: 08/18/20 12:13 PM   Specimen:  Blood  Ref Range & Units 6 mo ago Comments  Sodium 135 - 146 MMOL/L 137        Potassium 3.5 - 5.3 MMOL/L 4.4        Chloride  98 - 110 MMOL/L 105        CO2 23 - 30 MMOL/L 24        BUN 8 - 24 MG/DL 13        Glucose 70 - 99 MG/DL 96  Patients taking eltrombopag at doses >/= 100 mg daily may show falsely elevated values  of 10% or greater.      Creatinine 0.50 - 1.50 MG/DL 0.66        Calcium 8.5 - 10.5 MG/DL 9.0        Total Protein 6.0 - 8.3 G/DL 7.4  Patients taking eltrombopag at doses >/= 100 mg daily may show falsely elevated values of 10% or greater.      Albumin  3.5 - 5.0 G/DL 4.2        Total Bilirubin 0.1 - 1.2 MG/DL 0.6  Patients taking eltrombopag at doses >/= 100 mg daily may show falsely elevated values of 10% or greater.      Alkaline Phosphatase 25 - 125 IU/L or U/L 58        AST (SGOT) 5 - 40 IU/L or U/L 22        ALT (SGPT) 5 - 50 IU/L or U/L 19        Anion Gap 4 - 14 MMOL/L 8        Est. GFR Non-African American >=60 ML/MIN/1.73 M*2  >=90  GFR estimated by CKD-EPI equations, reportable up to 90 ML/MIN/1.73 [place]   Specimen Collected: 02/13/20 9:36 AM Last Resulted: 02/13/20 12:24 PM  Received From: [facility]  Result Received: 02/24/20 1:35 PM  Specimen:  Blood  Ref Range & Units 6 mo ago Comments  Total Vitamin D 25-Hydroxy >=30 ng/mL 28Low  Total Vitamin D includes D2 and D3. D3 can originate from both endogenous and supplemental sources, whereas D2 is not endogenously produced.  Therapy is based on Total 25(OH) Vitamin D with concentrations <20 ng/mL being indicative of Vitamin D deficiency. Optimal concentrations should be >30 ng/mL.      Resulting Agency  [facility]   Specimen Collected: 02/13/20 9:36 AM Last Resulted: 02/13/20 2:05 PM  Received From: [facility]  Result Received: 02/24/20 1:35 PM   Ref Range & Units 6 mo ago   Urine Color Yellow  Yellow      Urine Appearance Clear  Clear      Urine Specific Gravity 1.005 - 1.030  1.006      Urine pH 5.0 - 8.0  7.0      Urine Leukocyte Esterase Negative  TraceAbnormal      Urine Nitrite Negative  Negative      Urine Protein Negative MG/DL Negative      Urine Glucose Negative MG/DL  Negative      Urine Ketone Negative MG/DL Negative      Urine Urobilinogen Negative EU/DL Negative      Urine Bilirubin Negative  Negative      Urine Blood/Hb Negative  Negative      Urine RBC 0 - 3 /HPF 1      Urine WBC 0 - 3 /HPF <1      Urine Squamous Epithelial Cells 0 - 5 /HPF 3      Resulting Agency  [facility]  Specimen Collected: 02/13/20 9:36 AM Last Resulted: 02/13/20 12:12 PM  Received From: [facility]  [facility]  Result Received: 02/24/20 1:35 PM  Specimen:  Blood  Ref Range & Units 6 mo ago  WBC 4.4 - 11.0 x 10*3/uL 5.1      RBC 4.10 - 5.10 x 10*6/uL 4.47      Hemoglobin 12.3 - 15.3 G/DL 13.9      Hematocrit 35.9 - 44.6 % 41.0      MCV 80.0 - 96.0 FL 91.6      MCH 27.5 - 33.2 PG 31.1      MCHC 33.0 - 37.0 G/DL 34.0      RDW 12.3 - 17.0 % 13.5      Platelets 150 - 450 X 10*3/uL 225      MPV 6.8 - 10.2 FL 9.2      Neutrophil % % 55      Lymphocyte % % 34      Monocyte % % 8      Eosinophil % % 2      Basophil % % 1      Neutrophil Absolute 1.8 - 7.8 x 10*3/uL 2.8      Lymphocyte Absolute 1.0 - 4.8 x 10*3/uL 1.7      Monocyte Absolute 0.0 - 0.8 x 10*3/uL 0.4      Eosinophil Absolute 0.0 - 0.5 x 10*3/uL 0.1      Basophil Absolute 0.0 - 0.2 x 10*3/uL 0.0      Resulting Agency  [facility]  Specimen Collected: 02/13/20 9:36 AM Last Resulted: 02/13/20 12:10 PM  Received From: [facility]  Result Received: 02/24/20 1:35 PM    Discussed PCM lab results with patient from 02/24/2020  TSH and Hgba1c not perfomed will draw nonfasting today along with repeat lipids as patient has tried to amend diet and activity to improve her lipid levels and wants to know if her efforts working.  Patient did not want to repeat ua today.  Holding on vitamin D repeat since patient reported over the summer she has not been consistent in taking her supplement.  Recommended retest in 3 months of  taking her supplement consistently to see if then normal range was low previously.         Assessment & Plan:  A-fatigue, elevated blood pressure, hyperlipidemia, obesity BMI 31.15, left leg swelling, low serum vitamin d  P-Will check Hgba1c and TSH today.  Encouraged to get total of 7-8 hours sleep per night as currently averaging 5-6 with 30 minute afternoon nap.  Discussed taking nap can shorten night time sleep duration.  Try to stay on regular schedule.  Hypertension, obesity, low vitamin D could be contributing to fatigue also.  Continue afternoon exercise program and recommended eating 3 meals per day with lean protein/fruits/vegetables.  No anemia, normal electrolytes/glucose spot/renal/liver function labs earlier this year.  exitcare handout on fatigue printed and given to patient.  Patient verbalized understanding information/instructions, agreed with plan of care and had no further questions at this time.  Weight has crept up since PCM visit.  Encouraged weight loss.  Discussed leg swelling could be indication of heart working harder.  Discussed hydrochlorothiazide 12.5mg  po daily trial if BP rechecks with RN Hildred Alamin continue above 130/80s or prn on days with leg swelling if BP below that goal on formulary at [facility]. ER if chest pain, worst headache of life, dyspnea or visual changes for re-evaluation.  Will see RN Hildred Alamin next week for repeat BP checks.  Exitcare handout preventing dash, hypertension and  edema.  Consider compression knee high socks.  Elevate legs when sitting.  Patient verbalized understanding information/instructions, agreed with plan of care and had no further questions at this time.  Nonfasting lipids today.  Atherosclerosis noted on abdomen CT earlier this year.  Consider statin. Discussed lifestyle modification re diet to decrease white starches/sugars e.g. potatoes/bread increase omega 3 sources nuts, fish options add more fruits and vegetables and lean protein  choices such as hummus or yogurt. Patient given exitcare handout on high cholesterol, high fiber diet. Discussed weight loss.  Patient agreed with plan of care and verbalized understanding of information/instructions and had no further questions at this time.   Vit D 28 and history of osteopenia  Encouraged patient to take her vitamin d as prescribed consistently.  Check serum level in 3 months of taking supplement consistently.  Patient verbalized understanding information/instructions, agreed with plan of care and had no further questions at this time.

## 2020-08-20 NOTE — Patient Instructions (Signed)
High-Fiber Diet Fiber, also called dietary fiber, is a type of carbohydrate that is found in fruits, vegetables, whole grains, and beans. A high-fiber diet can have many health benefits. Your health care provider may recommend a high-fiber diet to help:  Prevent constipation. Fiber can make your bowel movements more regular.  Lower your cholesterol.  Relieve the following conditions: ? Swelling of veins in the anus (hemorrhoids). ? Swelling and irritation (inflammation) of specific areas of the digestive tract (uncomplicated diverticulosis). ? A problem of the large intestine (colon) that sometimes causes pain and diarrhea (irritable bowel syndrome, IBS).  Prevent overeating as part of a weight-loss plan.  Prevent heart disease, type 2 diabetes, and certain cancers. What is my plan? The recommended daily fiber intake in grams (g) includes:  38 g for men age 60 or younger.  30 g for men over age 55.  45 g for women age 54 or younger.  21 g for women over age 81. You can get the recommended daily intake of dietary fiber by:  Eating a variety of fruits, vegetables, grains, and beans.  Taking a fiber supplement, if it is not possible to get enough fiber through your diet. What do I need to know about a high-fiber diet?  It is better to get fiber through food sources rather than from fiber supplements. There is not a lot of research about how effective supplements are.  Always check the fiber content on the nutrition facts label of any prepackaged food. Look for foods that contain 5 g of fiber or more per serving.  Talk with a diet and nutrition specialist (dietitian) if you have questions about specific foods that are recommended or not recommended for your medical condition, especially if those foods are not listed below.  Gradually increase how much fiber you consume. If you increase your intake of dietary fiber too quickly, you may have bloating, cramping, or gas.  Drink plenty  of water. Water helps you to digest fiber. What are tips for following this plan?  Eat a wide variety of high-fiber foods.  Make sure that half of the grains that you eat each day are whole grains.  Eat breads and cereals that are made with whole-grain flour instead of refined flour or white flour.  Eat brown rice, bulgur wheat, or millet instead of white rice.  Start the day with a breakfast that is high in fiber, such as a cereal that contains 5 g of fiber or more per serving.  Use beans in place of meat in soups, salads, and pasta dishes.  Eat high-fiber snacks, such as berries, raw vegetables, nuts, and popcorn.  Choose whole fruits and vegetables instead of processed forms like juice or sauce. What foods can I eat?  Fruits Berries. Pears. Apples. Oranges. Avocado. Prunes and raisins. Dried figs. Vegetables Sweet potatoes. Spinach. Kale. Artichokes. Cabbage. Broccoli. Cauliflower. Green peas. Carrots. Squash. Grains Whole-grain breads. Multigrain cereal. Oats and oatmeal. Brown rice. Barley. Bulgur wheat. Garden. Quinoa. Bran muffins. Popcorn. Rye wafer crackers. Meats and other proteins [place], kidney, and pinto beans. Soybeans. Split peas. Lentils. Nuts and seeds. Dairy Fiber-fortified yogurt. Beverages Fiber-fortified soy milk. Fiber-fortified orange juice. Other foods Fiber bars. The items listed above may not be a complete list of recommended foods and beverages. Contact a dietitian for more options. What foods are not recommended? Fruits Fruit juice. Cooked, strained fruit. Vegetables Fried potatoes. Canned vegetables. Well-cooked vegetables. Grains White bread. Pasta made with refined flour. White rice. Meats and other  proteins Fatty cuts of meat. Fried chicken or fried fish. Dairy Milk. Yogurt. Cream cheese. Sour cream. Fats and oils Butters. Beverages Soft drinks. Other foods Cakes and pastries. The items listed above may not be a complete list of foods  and beverages to avoid. Contact a dietitian for more information. Summary  Fiber is a type of carbohydrate. It is found in fruits, vegetables, whole grains, and beans.  There are many health benefits of eating a high-fiber diet, such as preventing constipation, lowering blood cholesterol, helping with weight loss, and reducing your risk of heart disease, diabetes, and certain cancers.  Gradually increase your intake of fiber. Increasing too fast can result in cramping, bloating, and gas. Drink plenty of water while you increase your fiber.  The best sources of fiber include whole fruits and vegetables, whole grains, nuts, seeds, and beans. This information is not intended to replace advice given to you by your health care provider. Make sure you discuss any questions you have with your health care provider. Document Revised: 09/18/2017 Document Reviewed: 09/18/2017 Elsevier Patient Education  2020 [place]. Preventing High Cholesterol Cholesterol is a white, waxy substance similar to fat that the human body needs to help build cells. The liver makes all the cholesterol that a person's body needs. Having high cholesterol (hypercholesterolemia) increases a person's risk for heart disease and stroke. Extra (excess) cholesterol comes from the food the person eats. High cholesterol can often be prevented with diet and lifestyle changes. If you already have high cholesterol, you can control it with diet and lifestyle changes and with medicine. How can high cholesterol affect me? If you have high cholesterol, deposits (plaques) may build up on the walls of your arteries. The arteries are the blood vessels that carry blood away from your heart. Plaques make the arteries narrower and stiffer. This can limit or block blood flow and cause blood clots to form. Blood clots:  Are tiny balls of cells that form in your blood.  Can move to the heart or brain, causing a heart attack or stroke. Plaques in  arteries greatly increase your risk for heart attack and stroke.Making diet and lifestyle changes can reduce your risk for these conditions that may threaten your life. What can increase my risk? This condition is more likely to develop in people who:  Eat foods that are high in saturated fat or cholesterol. Saturated fat is mostly found in: ? Foods that contain animal fat, such as red meat and some dairy products. ? Certain fatty foods made from plants, such as tropical oils.  Are overweight.  Are not getting enough exercise.  Have a family history of high cholesterol. What actions can I take to prevent this? Nutrition   Eat less saturated fat.  Avoid trans fats (partially hydrogenated oils). These are often found in margarine and in some baked goods, fried foods, and snacks bought in packages.  Avoid precooked or cured meat, such as sausages or meat loaves.  Avoid foods and drinks that have added sugars.  Eat more fruits, vegetables, and whole grains.  Choose healthy sources of protein, such as fish, poultry, lean cuts of red meat, beans, peas, lentils, and nuts.  Choose healthy sources of fat, such as: ? Nuts. ? Vegetable oils, especially olive oil. ? Fish that have healthy fats (omega-3 fatty acids), such as mackerel or salmon. The items listed above may not be a complete list of recommended foods and beverages. Contact a dietitian for more information. Lifestyle  Lose  weight if you are overweight. Losing 5-10 lb (2.3-4.5 kg) can help prevent or control high cholesterol. It can also lower your risk for diabetes and high blood pressure. Ask your health care provider to help you with a diet and exercise plan to lose weight safely.  Do not use any products that contain nicotine or tobacco, such as cigarettes, e-cigarettes, and chewing tobacco. If you need help quitting, ask your health care provider.  Limit your alcohol intake. ? Do not drink alcohol if:  Your health care  provider tells you not to drink.  You are pregnant, may be pregnant, or are planning to become pregnant. ? If you drink alcohol:  Limit how much you use to:  0-1 drink a day for women.  0-2 drinks a day for men.  Be aware of how much alcohol is in your drink. In the [place] one drink equals one 12 oz bottle of beer (355 mL), one 5 oz glass of wine (148 mL), or one 1 oz glass of hard liquor (44 mL). Activity   Get enough exercise. Each week, do at least 150 minutes of exercise that takes a medium level of effort (moderate-intensity exercise). ? This is exercise that:  Makes your heart beat faster and makes you breathe harder than usual.  Allows you to still be able to talk. ? You could exercise in short sessions several times a day or longer sessions a few times a week. For example, on 5 days each week, you could walk fast or ride your bike 3 times a day for 10 minutes each time.  Do exercises as told by your health care provider. Medicines  In addition to diet and lifestyle changes, your health care provider may recommend medicines to help lower cholesterol. This may be a medicine to lower the amount of cholesterol your liver makes. You may need medicine if: ? Diet and lifestyle changes do not lower your cholesterol enough. ? You have high cholesterol and other risk factors for heart disease or stroke.  Take over-the-counter and prescription medicines only as told by your health care provider. General information  Manage your risk factors for high cholesterol. Talk with your health care provider about all your risk factors and how to lower your risk.  Manage other conditions that you have, such as diabetes or high blood pressure (hypertension).  Have blood tests to check your cholesterol levels at regular points in time as told by your health care provider.  Keep all follow-up visits as told by your health care provider. This is important. Where to find more  information  American Heart Association: www.heart.org  National Heart, Lung, and Blood Institute: https://wilson-eaton.com/ Summary  High cholesterol increases your risk for heart disease and stroke. By keeping your cholesterol level low, you can reduce your risk for these conditions.  High cholesterol can often be prevented with diet and lifestyle changes.  Work with your health care provider to manage your risk factors, and have your blood tested regularly. This information is not intended to replace advice given to you by your health care provider. Make sure you discuss any questions you have with your health care provider. Document Revised: 03/08/2019 Document Reviewed: 07/23/2016 Elsevier Patient Education  [place]. Edema  Edema is an abnormal buildup of fluids in the body tissues and under the skin. Swelling of the legs, feet, and ankles is a common symptom that becomes more likely as you get older. Swelling is also common in looser tissues, like  around the eyes. When the affected area is squeezed, the fluid may move out of that spot and leave a dent for a few moments. This dent is called pitting edema. There are many possible causes of edema. Eating too much salt (sodium) and being on your feet or sitting for a long time can cause edema in your legs, feet, and ankles. Hot weather may make edema worse. Common causes of edema include:  Heart failure.  Liver or kidney disease.  Weak leg blood vessels.  Cancer.  An injury.  Pregnancy.  Medicines.  Being obese.  Low protein levels in the blood. Edema is usually painless. Your skin may look swollen or shiny. Follow these instructions at home:  Keep the affected body part raised (elevated) above the level of your heart when you are sitting or lying down.  Do not sit still or stand for long periods of time.  Do not wear tight clothing. Do not wear garters on your upper legs.  Exercise your legs to get your circulation  going. This helps to move the fluid back into your blood vessels, and it may help the swelling go down.  Wear elastic bandages or support stockings to reduce swelling as told by your health care provider.  Eat a low-salt (low-sodium) diet to reduce fluid as told by your health care provider.  Depending on the cause of your swelling, you may need to limit how much fluid you drink (fluid restriction).  Take over-the-counter and prescription medicines only as told by your health care provider. Contact a health care provider if:  Your edema does not get better with treatment.  You have heart, liver, or kidney disease and have symptoms of edema.  You have sudden and unexplained weight gain. Get help right away if:  You develop shortness of breath or chest pain.  You cannot breathe when you lie down.  You develop pain, redness, or warmth in the swollen areas.  You have heart, liver, or kidney disease and suddenly get edema.  You have a fever and your symptoms suddenly get worse. Summary  Edema is an abnormal buildup of fluids in the body tissues and under the skin.  Eating too much salt (sodium) and being on your feet or sitting for a long time can cause edema in your legs, feet, and ankles.  Keep the affected body part raised (elevated) above the level of your heart when you are sitting or lying down. This information is not intended to replace advice given to you by your health care provider. Make sure you discuss any questions you have with your health care provider. Document Revised: 04/03/2019 Document Reviewed: 12/17/2016 Elsevier Patient Education  [place] DASH stands for "Dietary Approaches to Stop Hypertension." The DASH eating plan is a healthy eating plan that has been shown to reduce high blood pressure (hypertension). It may also reduce your risk for type 2 diabetes, heart disease, and stroke. The DASH eating plan may also help with weight  loss. What are tips for following this plan?  General guidelines  Avoid eating more than 2,300 mg (milligrams) of salt (sodium) a day. If you have hypertension, you may need to reduce your sodium intake to 1,500 mg a day.  Limit alcohol intake to no more than 1 drink a day for nonpregnant women and 2 drinks a day for men. One drink equals 12 oz of beer, 5 oz of wine, or 1 oz of hard liquor.  Work with  your health care provider to maintain a healthy body weight or to lose weight. Ask what an ideal weight is for you.  Get at least 30 minutes of exercise that causes your heart to beat faster (aerobic exercise) most days of the week. Activities may include walking, swimming, or biking.  Work with your health care provider or diet and nutrition specialist (dietitian) to adjust your eating plan to your individual calorie needs. Reading food labels   Check food labels for the amount of sodium per serving. Choose foods with less than 5 percent of the Daily Value of sodium. Generally, foods with less than 300 mg of sodium per serving fit into this eating plan.  To find whole grains, look for the word "whole" as the first word in the ingredient list. Shopping  Buy products labeled as "low-sodium" or "no salt added."  Buy fresh foods. Avoid canned foods and premade or frozen meals. Cooking  Avoid adding salt when cooking. Use salt-free seasonings or herbs instead of table salt or sea salt. Check with your health care provider or pharmacist before using salt substitutes.  Do not fry foods. Cook foods using healthy methods such as baking, boiling, grilling, and broiling instead.  Cook with heart-healthy oils, such as olive, canola, soybean, or sunflower oil. Meal planning  Eat a balanced diet that includes: ? 5 or more servings of fruits and vegetables each day. At each meal, try to fill half of your plate with fruits and vegetables. ? Up to 6-8 servings of whole grains each day. ? Less than  6 oz of lean meat, poultry, or fish each day. A 3-oz serving of meat is about the same size as a deck of cards. One egg equals 1 oz. ? 2 servings of low-fat dairy each day. ? A serving of nuts, seeds, or beans 5 times each week. ? Heart-healthy fats. Healthy fats called Omega-3 fatty acids are found in foods such as flaxseeds and coldwater fish, like sardines, salmon, and mackerel.  Limit how much you eat of the following: ? Canned or prepackaged foods. ? Food that is high in trans fat, such as fried foods. ? Food that is high in saturated fat, such as fatty meat. ? Sweets, desserts, sugary drinks, and other foods with added sugar. ? Full-fat dairy products.  Do not salt foods before eating.  Try to eat at least 2 vegetarian meals each week.  Eat more home-cooked food and less restaurant, buffet, and fast food.  When eating at a restaurant, ask that your food be prepared with less salt or no salt, if possible. What foods are recommended? The items listed may not be a complete list. Talk with your dietitian about what dietary choices are best for you. Grains Whole-grain or whole-wheat bread. Whole-grain or whole-wheat pasta. Brown rice. [place]. Bulgur. Whole-grain and low-sodium cereals. Pita bread. Low-fat, low-sodium crackers. Whole-wheat flour tortillas. Vegetables Fresh or frozen vegetables (raw, steamed, roasted, or grilled). Low-sodium or reduced-sodium tomato and vegetable juice. Low-sodium or reduced-sodium tomato sauce and tomato paste. Low-sodium or reduced-sodium canned vegetables. Fruits All fresh, dried, or frozen fruit. Canned fruit in natural juice (without added sugar). Meat and other protein foods Skinless chicken or [place]. Ground chicken or [place]. Pork with fat trimmed off. Fish and seafood. Egg whites. Dried beans, peas, or lentils. Unsalted nuts, nut butters, and seeds. Unsalted canned beans. Lean cuts of beef with fat trimmed off. Low-sodium, lean deli  meat. Dairy Low-fat (1%) or fat-free (skim) milk. Fat-free,  low-fat, or reduced-fat cheeses. Nonfat, low-sodium ricotta or cottage cheese. Low-fat or nonfat yogurt. Low-fat, low-sodium cheese. Fats and oils Soft margarine without trans fats. Vegetable oil. Low-fat, reduced-fat, or light mayonnaise and salad dressings (reduced-sodium). Canola, safflower, olive, soybean, and sunflower oils. Avocado. Seasoning and other foods Herbs. Spices. Seasoning mixes without salt. Unsalted popcorn and pretzels. Fat-free sweets. What foods are not recommended? The items listed may not be a complete list. Talk with your dietitian about what dietary choices are best for you. Grains Baked goods made with fat, such as croissants, muffins, or some breads. Dry pasta or rice meal packs. Vegetables Creamed or fried vegetables. Vegetables in a cheese sauce. Regular canned vegetables (not low-sodium or reduced-sodium). Regular canned tomato sauce and paste (not low-sodium or reduced-sodium). Regular tomato and vegetable juice (not low-sodium or reduced-sodium). Angie Fava. Olives. Fruits Canned fruit in a light or heavy syrup. Fried fruit. Fruit in cream or butter sauce. Meat and other protein foods Fatty cuts of meat. Ribs. Fried meat. Berniece Salines. Sausage. Bologna and other processed lunch meats. Salami. Fatback. Hotdogs. Bratwurst. Salted nuts and seeds. Canned beans with added salt. Canned or smoked fish. Whole eggs or egg yolks. Chicken or [place] with skin. Dairy Whole or 2% milk, cream, and half-and-half. Whole or full-fat cream cheese. Whole-fat or sweetened yogurt. Full-fat cheese. Nondairy creamers. Whipped toppings. Processed cheese and cheese spreads. Fats and oils Butter. Stick margarine. Lard. Shortening. Ghee. Bacon fat. Tropical oils, such as coconut, palm kernel, or palm oil. Seasoning and other foods Salted popcorn and pretzels. Onion salt, garlic salt, seasoned salt, table salt, and sea salt. Worcestershire  sauce. Tartar sauce. Barbecue sauce. Teriyaki sauce. Soy sauce, including reduced-sodium. Steak sauce. Canned and packaged gravies. Fish sauce. Oyster sauce. Cocktail sauce. Horseradish that you find on the shelf. Ketchup. Mustard. Meat flavorings and tenderizers. Bouillon cubes. Hot sauce and Tabasco sauce. Premade or packaged marinades. Premade or packaged taco seasonings. Relishes. Regular salad dressings. Where to find more information:  National Heart, Lung, and [place]: https://wilson-eaton.com/  American Heart Association: www.heart.org Summary  The DASH eating plan is a healthy eating plan that has been shown to reduce high blood pressure (hypertension). It may also reduce your risk for type 2 diabetes, heart disease, and stroke.  With the DASH eating plan, you should limit salt (sodium) intake to 2,300 mg a day. If you have hypertension, you may need to reduce your sodium intake to 1,500 mg a day.  When on the DASH eating plan, aim to eat more fresh fruits and vegetables, whole grains, lean proteins, low-fat dairy, and heart-healthy fats.  Work with your health care provider or diet and nutrition specialist (dietitian) to adjust your eating plan to your individual calorie needs. This information is not intended to replace advice given to you by your health care provider. Make sure you discuss any questions you have with your health care provider. Document Revised: 10/27/2017 Document Reviewed: 11/07/2016 Elsevier Patient Education  [place]. Preventing Hypertension Hypertension, commonly called high blood pressure, is when the force of blood pumping through the arteries is too strong. Arteries are blood vessels that carry blood from the heart throughout the body. Over time, hypertension can damage the arteries and decrease blood flow to important parts of the body, including the brain, heart, and kidneys. Often, hypertension does not cause symptoms until blood pressure is very  high. For this reason, it is important to have your blood pressure checked on a regular basis. Hypertension can often be prevented  with diet and lifestyle changes. If you already have hypertension, you can control it with diet and lifestyle changes, as well as medicine. What nutrition changes can be made? Maintain a healthy diet. This includes:  Eating less salt (sodium). Ask your health care provider how much sodium is safe for you to have. The general recommendation is to consume less than 1 tsp (2,300 mg) of sodium a day. ? Do not add salt to your food. ? Choose low-sodium options when grocery shopping and eating out.  Limiting fats in your diet. You can do this by eating low-fat or fat-free dairy products and by eating less red meat.  Eating more fruits, vegetables, and whole grains. Make a goal to eat: ? 1-2 cups of fresh fruits and vegetables each day. ? 3-4 servings of whole grains each day.  Avoiding foods and beverages that have added sugars.  Eating fish that contain healthy fats (omega-3 fatty acids), such as mackerel or salmon. If you need help putting together a healthy eating plan, try the DASH diet. This diet is high in fruits, vegetables, and whole grains. It is low in sodium, red meat, and added sugars. DASH stands for Dietary Approaches to Stop Hypertension. What lifestyle changes can be made?   Lose weight if you are overweight. Losing just 3?5% of your body weight can help prevent or control hypertension. ? For example, if your present weight is 200 lb (91 kg), a loss of 3-5% of your weight means losing 6-10 lb (2.7-4.5 kg). ? Ask your health care provider to help you with a diet and exercise plan to safely lose weight.  Get enough exercise. Do at least 150 minutes of moderate-intensity exercise each week. ? You could do this in short exercise sessions several times a day, or you could do longer exercise sessions a few times a week. For example, you could take a brisk  10-minute walk or bike ride, 3 times a day, for 5 days a week.  Find ways to reduce stress, such as exercising, meditating, listening to music, or taking a yoga class. If you need help reducing stress, ask your health care provider.  Do not smoke. This includes e-cigarettes. Chemicals in tobacco and nicotine products raise your blood pressure each time you smoke. If you need help quitting, ask your health care provider.  Avoid alcohol. If you drink alcohol, limit alcohol intake to no more than 1 drink a day for nonpregnant women and 2 drinks a day for men. One drink equals 12 oz of beer, 5 oz of wine, or 1 oz of hard liquor. Why are these changes important? Diet and lifestyle changes can help you prevent hypertension, and they may make you feel better overall and improve your quality of life. If you have hypertension, making these changes will help you control it and help prevent major complications, such as:  Hardening and narrowing of arteries that supply blood to: ? Your heart. This can cause a heart attack. ? Your brain. This can cause a stroke. ? Your kidneys. This can cause kidney failure.  Stress on your heart muscle, which can cause heart failure. What can I do to lower my risk?  Work with your health care provider to make a hypertension prevention plan that works for you. Follow your plan and keep all follow-up visits as told by your health care provider.  Learn how to check your blood pressure at home. Make sure that you know your personal target blood pressure, as told  by your health care provider. How is this treated? In addition to diet and lifestyle changes, your health care provider may recommend medicines to help lower your blood pressure. You may need to try a few different medicines to find what works best for you. You also may need to take more than one medicine. Take over-the-counter and prescription medicines only as told by your health care provider. Where to find  support Your health care provider can help you prevent hypertension and help you keep your blood pressure at a healthy level. Your local hospital or your community may also provide support services and prevention programs. The American Heart Association offers an online support network at: CheapBootlegs.com.cy Where to find more information Learn more about hypertension from:  [place], Lung, and Blood Institute: ElectronicHangman.is  Centers for Disease Control and Prevention: https://ingram.com/  American Academy of Family Physicians: http://familydoctor.org/familydoctor/en/diseases-conditions/high-blood-pressure.printerview.all.html Learn more about the DASH diet from:  [place], Lung, and [place]: https://www.reyes.com/ Contact a health care provider if:  You think you are having a reaction to medicines you have taken.  You have recurrent headaches or feel dizzy.  You have swelling in your ankles.  You have trouble with your vision. Summary  Hypertension often does not cause any symptoms until blood pressure is very high. It is important to get your blood pressure checked regularly.  Diet and lifestyle changes are the most important steps in preventing hypertension.  By keeping your blood pressure in a healthy range, you can prevent complications like heart attack, heart failure, stroke, and kidney failure.  Work with your health care provider to make a hypertension prevention plan that works for you. This information is not intended to replace advice given to you by your health care provider. Make sure you discuss any questions you have with your health care provider. Document Revised: 03/08/2019 Document Reviewed: 07/25/2016 Elsevier Patient Education  [place]. Fatigue If you have fatigue, you feel tired all the time and have a lack of energy or a lack  of motivation. Fatigue may make it difficult to start or complete tasks because of exhaustion. In general, occasional or mild fatigue is often a normal response to activity or life. However, long-lasting (chronic) or extreme fatigue may be a symptom of a medical condition. Follow these instructions at home: General instructions  Watch your fatigue for any changes.  Go to bed and get up at the same time every day.  Avoid fatigue by pacing yourself during the day and getting enough sleep at night.  Maintain a healthy weight. Medicines  Take over-the-counter and prescription medicines only as told by your health care provider.  Take a multivitamin, if told by your health care provider.  Do not use herbal or dietary supplements unless they are approved by your health care provider. Activity   Exercise regularly, as told by your health care provider.  Use or practice techniques to help you relax, such as yoga, tai chi, meditation, or massage therapy. Eating and drinking   Avoid heavy meals in the evening.  Eat a well-balanced diet, which includes lean proteins, whole grains, plenty of fruits and vegetables, and low-fat dairy products.  Avoid consuming too much caffeine.  Avoid the use of alcohol.  Drink enough fluid to keep your urine pale yellow. Lifestyle  Change situations that cause you stress. Try to keep your work and personal schedule in balance.  Do not use any products that contain nicotine or tobacco, such as cigarettes and e-cigarettes. If  you need help quitting, ask your health care provider.  Do not use drugs. Contact a health care provider if:  Your fatigue does not get better.  You have a fever.  You suddenly lose or gain weight.  You have headaches.  You have trouble falling asleep or sleeping through the night.  You feel angry, guilty, anxious, or sad.  You are unable to have a bowel movement (constipation).  Your skin is dry.  You have  swelling in your legs or another part of your body. Get help right away if:  You feel confused.  Your vision is blurry.  You feel faint or you pass out.  You have a severe headache.  You have severe pain in your abdomen, your back, or the area between your waist and hips (pelvis).  You have chest pain, shortness of breath, or an irregular or fast heartbeat.  You are unable to urinate, or you urinate less than normal.  You have abnormal bleeding, such as bleeding from the rectum, vagina, nose, lungs, or nipples.  You vomit blood.  You have thoughts about hurting yourself or others. If you ever feel like you may hurt yourself or others, or have thoughts about taking your own life, get help right away. You can go to your nearest emergency department or call:  Your local emergency services (911 in the [place]  A suicide crisis helpline, such as the [place] at (445) 567-5966. This is open 24 hours a day. Summary  If you have fatigue, you feel tired all the time and have a lack of energy or a lack of motivation.  Fatigue may make it difficult to start or complete tasks because of exhaustion.  Long-lasting (chronic) or extreme fatigue may be a symptom of a medical condition.  Exercise regularly, as told by your health care provider.  Change situations that cause you stress. Try to keep your work and personal schedule in balance. This information is not intended to replace advice given to you by your health care provider. Make sure you discuss any questions you have with your health care provider. Document Revised: 06/05/2019 Document Reviewed: 08/09/2017 Elsevier Patient Education  2020 [employer].

## 2020-08-21 LAB — LIPID PANEL
Chol/HDL Ratio: 3.4 ratio (ref 0.0–4.4)
Cholesterol, Total: 242 mg/dL — ABNORMAL HIGH (ref 100–199)
HDL: 71 mg/dL (ref >39)
LDL Chol Calc (NIH): 121 mg/dL — ABNORMAL HIGH (ref 0–99)
Triglycerides: 290 mg/dL — ABNORMAL HIGH (ref 0–149)
VLDL Cholesterol Cal: 50 mg/dL — ABNORMAL HIGH (ref 5–40)

## 2020-08-21 LAB — TSH: TSH: 3.2 u[IU]/mL (ref 0.450–4.500)

## 2020-08-21 LAB — HGB A1C W/O EAG: Hgb A1c MFr Bld: 5.2 % (ref 4.8–5.6)

## 2020-08-21 NOTE — Progress Notes (Signed)
Reviewed results with pt. To help clarify triglycerides, which pt was very concerned about, and LDL vs A1c, provided additional triglyceride handout and referred back to high fiber handout provided yesterday. Reviewed NP recommendations as above. She denies etoh intake but did eat more sugars/sweets than usual while on vacation. Provided pt with hard copy of labs and wrote the 3 statins and doses that are carried in clinic on the labs with instructions to call pcp office and make appt to discuss statin possibility and labs.

## 2020-08-21 NOTE — Progress Notes (Signed)
TSH and Hgba1c normal.  Lipids worsening.   Elevated total, trigs, VLDL and LDL. HDL trending down from previous.  Encourage exercise 150 minutes per week.  Weight loss.   Decrease added sugars in diet and if drinking alcohol I recommend decreasing amount per week also to less than 1 drink per day. I would recommend patient start statin.  Increase fiber in diet given handout yesterday in clinic.  Please give patient printout of labs, encourage follow up appt with PCM.  Please write on her lab printout statins we have on formulary PDRx e.g. simvastatin, atorvastatin  Had abdomen CT earlier this year didn't show hemodynamically significant stenosis of aorta "IMPRESSION:  1. No CT findings to explain the patient's left lower quadrant pain.  2. No other CT findings to explain the patient's left lower quadrant  pain.  3. Aortic Atherosclerosis (ICD10-I70.0).    Electronically Signed  By: Constance Holster M.D.  On: 03/02/2020 22:38"

## 2020-11-02 ENCOUNTER — Encounter: Payer: Self-pay | Admitting: Nurse Practitioner

## 2020-11-02 ENCOUNTER — Ambulatory Visit (INDEPENDENT_AMBULATORY_CARE_PROVIDER_SITE_OTHER): Payer: No Typology Code available for payment source | Admitting: Nurse Practitioner

## 2020-11-02 ENCOUNTER — Other Ambulatory Visit: Payer: Self-pay

## 2020-11-02 VITALS — BP 122/78 | Ht 66.0 in | Wt 195.0 lb

## 2020-11-02 DIAGNOSIS — Z01419 Encounter for gynecological examination (general) (routine) without abnormal findings: Secondary | ICD-10-CM

## 2020-11-02 NOTE — Patient Instructions (Signed)

## 2020-11-02 NOTE — Progress Notes (Signed)
   Kimberly Hall Dec 08, 1960 [id number]   History:  59 y.o. [id number] presents for annual exam without GYN complaints. Postmenopausal - no HRT, no bleeding. Normal pap and mammogram history.   Gynecologic History No LMP recorded. Patient is postmenopausal.   Contraception: post menopausal status Last Pap: 02/18/2019. Results were: normal Last mammogram: 09/22/2020. Results were: normal Last colonoscopy: 2016. Results were: diverticuli, 10 year recall Last Dexa: 04/2019. Results were: normal, 5 year recall  Past medical history, past surgical history, family history and social history were all reviewed and documented in the EPIC chart.  ROS:  A ROS was performed and pertinent positives and negatives are included.  Exam:  Vitals:   11/02/20 1610  BP: 122/78  Weight: 195 lb (88.5 kg)  Height: 5\' 6"  (1.676 m)   Body mass index is 31.47 kg/m.  General appearance:  Normal Thyroid:  Symmetrical, normal in size, without palpable masses or nodularity. Respiratory  Auscultation:  Clear without wheezing or rhonchi Cardiovascular  Auscultation:  Regular rate, without rubs, murmurs or gallops  Edema/varicosities:  Not grossly evident Abdominal  Soft,nontender, without masses, guarding or rebound.  Liver/spleen:  No organomegaly noted  Hernia:  None appreciated  Skin  Inspection:  Grossly normal   Breasts: Examined lying and sitting.   Right: Without masses, retractions, discharge or axillary adenopathy.   Left: Without masses, retractions, discharge or axillary adenopathy. Gentitourinary   Inguinal/mons:  Normal without inguinal adenopathy  External genitalia:  Normal  BUS/Urethra/Skene's glands:  Normal  Vagina:  Normal  Cervix:  Normal  Uterus:  Normal in size, shape and contour.  Midline and mobile  Adnexa/parametria:     Rt: Without masses or tenderness.   Lt: Without masses or tenderness.  Anus and perineum: Normal  Digital rectal exam: Normal sphincter tone without  palpated masses or tenderness  Assessment/Plan:  59 y.o. [id number] for annual exam.   Well female exam with routine gynecological exam - Education provided on SBEs, importance of preventative screenings, current guidelines, high calcium diet, regular exercise, and multivitamin daily. Labs with PCP.   Screening for cervical cancer -normal Pap history.  Will repeat at 5-year interval per guidelines.  Screening for breast cancer -normal mammogram history.  Continue annual screenings.  Normal breast exam today.  Screening for colon cancer -screening colonoscopy in 2016.  She will repeat at [facility] recommended interval.   Screening for osteoporosis -normal bone density June 2020.  Will repeat at age 59.  Follow-up in 1 year for annual.     [place], 4:19 PM 11/02/2020

## 2020-12-07 ENCOUNTER — Encounter: Payer: Self-pay | Admitting: Registered Nurse

## 2020-12-07 ENCOUNTER — Telehealth: Payer: Self-pay | Admitting: Registered Nurse

## 2020-12-07 NOTE — Telephone Encounter (Signed)
HR manager Hyacinth Meeker notified NP that pt reported exposed on 12/07/2020 to a confirmed positive covid person (daughter positive test today). Patient sent home to begin quarantine at that time. Patient denied symptoms to HR.  Last onsite [facility] 12/07/2020.  Day 0 12/07/2020.    Attempted to reach patient via telephone no answer left message.  Per CDC recommendations. Day 1 of quarantine was 12/08/2020. Plan for testing on Day 5 12/12/2020 unless develops symptoms then day 3 of symptoms.  Reviewed possible Covid sx including cough, shortness of breath with exertion or at rest, runny nose, congestion, sinus pain/pressure, sore throat, fever/chills, body aches, fatigue, loss of taste/smell, GI symptoms of nausea/vomiting/diarrhea. Positive daughter should  isolate in own room and if possible use only one bathroom if living with others in home.  Wear mask when out of room to help prevent spread to others in household.  Patient should also wear mask when around others for the next 10 days after exposure.  If symptoms develop mask for 10 days after symptoms start and get testing on day 3.  Discussed to sanitize high touch surfaces like countertops, faucet handles, door/cabinet handles, microwave handle/controls and refrigerator handles daily with lysol, bleach or chlorox wipes/spray.  Discussed patient may call me at [phone] when clinic closed or email [email or web address].  RN Hildred Alamin will be in clinic tomorrow [phone] also.  HR notified of quarantine and test recommended dates and no answer from patient.

## 2020-12-07 NOTE — Telephone Encounter (Signed)
Patient returned call.  Daughter started with symptoms 1/8 fever.  They have cleaned house.  Daughter was tested today and test positive.  Terasa preferred [facility] for test first available 1/16 scheduled [id number].  Discussed wear mask, bring drivers license and insurance card and appt confirmation message on her phone or printed with her.  Stay in car, park in designated area do not go into store.  Wear mask until employee tells her to remove for swab which patient will perform under supervision of [facility] staff member.  Patient denied any symptoms.  Wearing mask and daughter quarantining at her house in separate room.  Patient is scheduled for booster 14 Dec 2020 covid.  May need to cancel appt if symptoms develop.  Discussed disinfect/cleaning of high touch surfaces daily.  Patient sent email with confirmation of appt at [facility] for testing.  HR notified updated test date.  Patient verbalized understanding information/instructions, agreed with plan of care and had no further questions at this time.

## 2020-12-14 NOTE — Telephone Encounter (Addendum)
Pt returned call. She confirms still asymptomatic. She is able to return to tomorrow 1/18 with strict mask use thru 1/20. Recommend still going for testing tomorrow when [facility] testing site reopens. She is agreeable. She is not sure if she will make it in to work tomorrow due to weather/roads. Advised to follow normal call out procedures if that is the case tomorrow.  Denies further needs/concerns.

## 2020-12-14 NOTE — Telephone Encounter (Signed)
Attempted to call pt to confirm RTW and if any sx. If still asymptomatic, she is cleared per CDC guidelines to RTW after 5 day quarantine even if no test result available.  No answer. LVMRCB.
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
## 2021-02-28 ENCOUNTER — Encounter: Payer: Self-pay | Admitting: Registered Nurse

## 2021-02-28 ENCOUNTER — Telehealth: Payer: Self-pay | Admitting: Registered Nurse

## 2021-02-28 NOTE — Telephone Encounter (Signed)
Patient contacted via telephone stated home test today negative, still feeling well and asymptomatic.  Cleared to return to work 4/5.  Patient scheduled off tomorrow for personal time 4/4.  Strict mask use through 4/8 and no eating in employee lunch room as not boosted.  Patient verbalized understanding information/instructions, agreed with plan of care and had no further questions at this time.  HR notified negative test results and cleared to return onsite with mask wear this week.

## 2021-03-25 NOTE — Telephone Encounter (Signed)
Late entry: Pt returned call from 4/7 late that evening. She reported that she did RTW on 4/5 as expected. She felt well. Closing encounter.

## 2021-03-25 NOTE — Telephone Encounter (Signed)
Reviewed RN Haley note agreed with plan of care. 

## 2021-04-29 ENCOUNTER — Encounter: Payer: Self-pay | Admitting: Registered Nurse

## 2021-04-29 ENCOUNTER — Other Ambulatory Visit: Payer: Self-pay

## 2021-04-29 ENCOUNTER — Ambulatory Visit: Payer: Self-pay | Admitting: Registered Nurse

## 2021-04-29 VITALS — BP 130/93 | HR 86 | Temp 96.9°F

## 2021-04-29 DIAGNOSIS — R030 Elevated blood-pressure reading, without diagnosis of hypertension: Secondary | ICD-10-CM

## 2021-04-29 DIAGNOSIS — M659 Synovitis and tenosynovitis, unspecified: Secondary | ICD-10-CM

## 2021-04-29 DIAGNOSIS — M79643 Pain in unspecified hand: Secondary | ICD-10-CM

## 2021-04-29 DIAGNOSIS — M7989 Other specified soft tissue disorders: Secondary | ICD-10-CM

## 2021-04-29 MED ORDER — IBUPROFEN 800 MG PO TABS: 800.0000 mg | ORAL_TABLET | Freq: Three times a day (TID) | ORAL | 0 refills | Status: AC | PRN

## 2021-04-29 NOTE — Progress Notes (Signed)
Subjective:    Patient ID: Kimberly Hall, female    DOB: May 31, 1961, 60 y.o.   MRN: [id number]  59y/o Caucasian established female pt c/o lump in palmar aspect of L hand. Present for approx 2 months and has gradually grown larger. It can cause pain to radiate down anterior wrist at times. Also has had cramping/spasms in wrist/palm of hand left greater than right.  Has been having more pain from thumbs into wrists bilaterally lately also.  Denied changes in work duties, exercise or home tasks.  Right hand dominant.     Review of Systems  Constitutional: Negative for activity change, appetite change, chills, diaphoresis, fatigue and fever.  HENT: Negative for trouble swallowing and voice change.   Eyes: Negative for photophobia and visual disturbance.  Respiratory: Negative for cough, shortness of breath, wheezing and stridor.   Cardiovascular: Negative for chest pain.  Gastrointestinal: Negative for diarrhea, nausea and vomiting.  Endocrine: Negative for cold intolerance and heat intolerance.  Genitourinary: Negative for difficulty urinating.  Musculoskeletal: Positive for myalgias. Negative for back pain, gait problem, joint swelling, neck pain and neck stiffness.  Skin: Negative for color change, pallor, rash and wound.  Allergic/Immunologic: Negative for food allergies.  Neurological: Negative for dizziness, tremors, seizures, syncope, facial asymmetry, speech difficulty, weakness, light-headedness, numbness and headaches.  Hematological: Negative for adenopathy. Does not bruise/bleed easily.  Psychiatric/Behavioral: Negative for agitation, confusion and sleep disturbance.       Objective:   Physical Exam Vitals and nursing note reviewed.  Constitutional:      General: She is awake. She is not in acute distress.    Appearance: Normal appearance. She is well-developed, well-groomed and overweight. She is not ill-appearing, toxic-appearing or diaphoretic.  HENT:     Head:  Normocephalic and atraumatic.     Jaw: There is normal jaw occlusion.     Right Ear: Hearing and external ear normal.     Left Ear: Hearing and external ear normal.     Nose: Nose normal. No congestion or rhinorrhea.     Mouth/Throat:     Lips: Pink. No lesions.     Mouth: Mucous membranes are moist.     Pharynx: Oropharynx is clear.  Eyes:     General: Lids are normal. Vision grossly intact. Gaze aligned appropriately. Allergic shiner present. No visual field deficit or scleral icterus.       Right eye: No discharge.        Left eye: No discharge.     Extraocular Movements: Extraocular movements intact.     Conjunctiva/sclera: Conjunctivae normal.     Pupils: Pupils are equal, round, and reactive to light.  Neck:     Trachea: Trachea and phonation normal. No tracheal deviation.  Cardiovascular:     Rate and Rhythm: Normal rate and regular rhythm.     Pulses: Normal pulses.          Radial pulses are 2+ on the right side and 2+ on the left side.  Pulmonary:     Effort: Pulmonary effort is normal. No respiratory distress.     Breath sounds: Normal breath sounds and air entry. No stridor or transmitted upper airway sounds. No wheezing or rhonchi.     Comments: Spoke full sentences without difficulty; no cough in exam room Abdominal:     Palpations: Abdomen is soft.  Musculoskeletal:        General: Swelling and tenderness present. No signs of injury. Normal range of motion.  Right shoulder: No swelling, deformity, effusion, laceration, tenderness, bony tenderness or crepitus. Normal range of motion. Normal strength.     Left shoulder: No swelling, deformity, effusion, laceration, tenderness, bony tenderness or crepitus. Normal range of motion. Normal strength.     Right elbow: No swelling, deformity, effusion or lacerations. Normal range of motion.     Left elbow: No swelling, deformity, effusion or lacerations. Normal range of motion.     Right forearm: No swelling, edema,  deformity, lacerations, tenderness or bony tenderness.     Left forearm: No swelling, edema, deformity, lacerations, tenderness or bony tenderness.     Right wrist: No swelling, deformity, effusion, lacerations, tenderness, bony tenderness, snuff box tenderness or crepitus. Normal range of motion. Normal pulse.     Left wrist: No swelling, deformity, effusion, lacerations, tenderness, bony tenderness, snuff box tenderness or crepitus. Normal range of motion. Normal pulse.     Right hand: Tenderness present. No swelling, deformity, lacerations or bony tenderness. Normal range of motion. Normal strength. Normal sensation. There is no disruption of two-point discrimination. Normal capillary refill. Normal pulse.     Left hand: Swelling and tenderness present. No deformity, lacerations or bony tenderness. Normal range of motion. Normal strength. Normal sensation. There is no disruption of two-point discrimination. Normal capillary refill. Normal pulse.       Arms:     Cervical back: Normal range of motion and neck supple. No swelling, edema, deformity, erythema, signs of trauma, lacerations, rigidity, spasms, torticollis, tenderness or crepitus. No pain with movement. Normal range of motion.     Thoracic back: No swelling, edema, deformity, signs of trauma, lacerations or spasms. Normal range of motion.     Lumbar back: No swelling, edema, deformity, signs of trauma or lacerations. Normal range of motion.     Right lower leg: No edema.     Left lower leg: No edema.     Comments: Bilaterally positive finkelstein's test thumbs; negative tinnels and phalens tests; pain with flexion left 4th digit radiates into forearm; normal AROM equal bilaterally all fingers flexion/extension/adduction/abduction; left shoulder external rotation limited and right similar 30 degrees  Lymphadenopathy:     Head:     Right side of head: No submental or preauricular adenopathy.     Left side of head: No submental or  preauricular adenopathy.     Cervical: No cervical adenopathy.     Right cervical: No superficial cervical adenopathy.    Left cervical: No superficial cervical adenopathy.  Skin:    General: Skin is warm and dry.     Capillary Refill: Capillary refill takes less than 2 seconds.     Coloration: Skin is not ashen, cyanotic, jaundiced, mottled, pale or sallow.     Findings: No abrasion, abscess, acne, bruising, burn, ecchymosis, erythema, signs of injury, laceration, lesion, petechiae, rash or wound.     Nails: There is no clubbing.  Neurological:     General: No focal deficit present.     Mental Status: She is alert and oriented to person, place, and time. Mental status is at baseline.     GCS: GCS eye subscore is 4. GCS verbal subscore is 5. GCS motor subscore is 6.     Cranial Nerves: Cranial nerves are intact. No cranial nerve deficit, dysarthria or facial asymmetry.     Sensory: Sensation is intact. No sensory deficit.     Motor: Motor function is intact. No weakness, tremor, atrophy, abnormal muscle tone or seizure activity.  Coordination: Coordination is intact. Coordination normal.     Gait: Gait is intact. Gait normal.     Comments: Gait sure and steady in clinic; in/out of chair and on/off exam table without difficulty; bilateral hand grasp equal 5/5  Psychiatric:        Attention and Perception: Attention and perception normal.        Mood and Affect: Mood and affect normal.        Speech: Speech normal.        Behavior: Behavior normal. Behavior is cooperative.        Thought Content: Thought content normal.        Cognition and Memory: Cognition and memory normal.        Judgment: Judgment normal.           Assessment & Plan:  A-left hand pain and swelling acute initial visit; bilateral tenosynovitis of wrists initial visit, elevated blood pressure  P-Fitted and distributed wrist thumb spica splints bilateral large from clinic stock Discussed may hand wash and air  dry or use blowdryer but do not place in drying machine for clothes as can melt plastic.  biofreeze gel apply QID prn pain Refilled ibuprofen 800mg  po TID prn with food x 2 weeks trial #90 RF0 dispensed from [facility] to patient.  Lift fewer items at one time due to weight.  Discussed positive finkelstein's test and suspect ganglion cyst left hand.  Wear splints at work if pain worsening at work but definitely wear splints at night while sleeping and wear left when at work due to dequervains tenosynovitis Patient was instructed to rest, ice and elevate arms if swelling noted after work.  Cryotherapy 15 minutes TID prn pain/swelling. [employer] handout on ganglion cyst, dequervains tenosynovitis, tenosynovitis sports rehab exercises printed and given to patient.  Reviewed and demonstrated all handout exercises with patient in exam room. Medications as directed. No other nsaids while taken ibuprofen e.g. aleve/advil/naproxen/naprosyn/motrin. Suspect overuse soft tissue injury related pain due to repetitive motion.  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 Call or return to clinic as needed if these symptoms worsen  or fail to improve prior to orthopedics visit.  Next follow up evaluation to be scheduled with RN Hildred Alamin in 2 weeks.  Discussed tendon inflamed and NSAID/ice help to improve.  Avoid starting new activities that utilize hands in the next two weeks and then only 15 minutes per day after that time of rest.  Patient verbalized agreement and understanding of treatment plan and had no further questions at this time. P2: ROM, injury prevention   Discussed exercise, weight loss as BP elevated.  Consider dash diet.  Pain today will repeat blood pressure in 2 weeks.  Seek follow up if chest pain/dyspnea/worst headache of life same day.

## 2021-04-29 NOTE — Patient Instructions (Addendum)
Proximal Biceps Tendinitis and Tenosynovitis Rehab Ask your health care provider which exercises are safe for you. Do exercises exactly as told by your health care provider and adjust them as directed. It is normal to feel mild stretching, pulling, tightness, or discomfort as you do these exercises. Stop right away if you feel sudden pain or your pain gets worse. Do not begin these exercises until told by your health care provider. Stretching and range-of-motion exercises These exercises warm up your muscles and joints and improve the movement and flexibility of your arm and shoulder. The exercises also help to relieve pain and stiffness. Forearm rotation 1. Stand or sit with your left / right elbow bent in a 90-degree (right angle). Position your forearm so that the thumb is facing the ceiling (neutral position). 2. Rotate your palm up until it cannot go any farther. 3. Hold this position for ____15______ seconds. 4. Rotate your palm down until it cannot go any farther. 5. Hold this position for ____15______ seconds. Repeat ____3______ times. Complete this exercise _____2_____ times a day. Elbow range of motion 1. Stand or sit with your left / right elbow bent in a 90-degree angle (right angle). Position your forearm so that the thumb is facing the ceiling (neutral position). 2. Slowly bend your elbow as far as you can until you feel a stretch or cannot go any farther. 3. Hold this position for _____15_____ seconds. 4. Slowly straighten your elbow as far as you can until you feel a stretch or cannot go any farther. 5. Hold this position for _____15_____ seconds. Repeat ____3______ times. Complete this exercise ____2______ times a day. Biceps stretch 1. Stand facing a wall, or stand by a door frame. 2. Raise your left / right arm out to your side, to your shoulder height. Place the thumb side of your hand against the wall. Your palm should be facing the floor (palm down). 3. Keeping your arm  straight, rotate your body in the opposite direction of the raised arm until you feel a gentle stretch in your biceps. 4. Hold this position for ____15______ seconds. 5. Slowly return to the starting position. Repeat _____3_____ times. Complete this exercise _____2_____ times a day. Shoulder pendulum 1. Stand near a table or counter that you can hold onto for balance. 2. Bend forward at the waist and let your left / right arm hang straight down. Use your other arm to support you and help you stay balanced. 3. Relax your left / right arm and shoulder muscles, and move your hips and your trunk so your left / right arm swings freely. Your arm should swing because of the motion of your body, not because you are using your arm or shoulder muscles. 4. Keep moving your hips and trunk so your arm swings in the following directions, as told by your health care provider: ? Side to side. ? Forward and backward. ? In clockwise and counterclockwise circles. Repeat _____3_____ times. Complete this exercise _____2_____ times a day.   Shoulder flexion, assisted 1. Stand facing a wall. Put your left / right palm on the wall. 2. Slowly move your left / right hand up the wall (flexion). Stop when you feel a stretch in your shoulder, or when you reach the angle that is recommended by your health care provider. ? Use your other hand to help raise your arm, if needed (assisted). ? As your hand gets higher, you may need to step closer to the wall. ? Avoid shrugging or lifting  your shoulder up as you raise your arm. To do this, keep your shoulder blade tucked down toward your spine. 3. Hold this position for _____15_____ seconds. 4. Slowly return to the starting position. Use your other arm to help, if needed. Repeat ____3______ times. Complete this exercise ______2____ times a day.   Shoulder flexion 1. Stand with your left / right arm hanging down at your side. 2. Keep your arm straight as you lift your arm  forward and toward the ceiling (flexion). 3. Hold this position for ___15_______ seconds. 4. Slowly return to the starting position. Repeat _____3_____ times. Complete this exercise _____2_____ times a day. Sleeper stretch, assisted 1. Lie on your left / right side (injured side) with your hips and knees bent and your left / right arm straight in front of you. 2. Bend your elbow to a 90-degree angle (right angle), so your fingers are pointing to the ceiling. 3. Use your other hand to gently push your arm toward the floor (assisted), stopping when you feel a gentle stretch. ? Keep your shoulder blades lightly squeezed together during the exercise. 4. Hold this position for ___15_______ seconds. 5. Slowly return to the starting position. Repeat ____3______ times. Complete this exercise ____2______ times a day. Strengthening exercises These exercises build strength and endurance in your arm and shoulder. Endurance is the ability to use your muscles for a long time, even after they get tired. Biceps curls You can use a weight or an exercise band for this exercise. 1. Sit on a stable chair without armrests, or stand up. 2. Hold a ____none______ weight in your left / right hand, or hold an exercise band with both hands. Your palms should face up toward the ceiling at the starting position. 3. Bend your left / right elbow and move your hand up toward your shoulder. Keep your other arm straight down, in the starting position. 4. Hold this position for ____15______ seconds. 5. Slowly return to the starting position. Repeat ___3_______ times. Complete this exercise ____2______ times a day.   Internal shoulder rotation You will use an exercise band secured to a stable object at waist height for this exercise. A door and doorframe work well. 1. Stand sideways next to a door with your left / right arm closest to the door, holding the exercise band in your hand. 2. With your elbow bent in a 90-degree  angle (right angle) and keeping your elbow at your side, bring your hand toward your belly (internal rotation). ? Make sure your wrist is staying straight as you do this exercise. 3. Hold this position for ___15_______ seconds. 4. Slowly return to the starting position. Repeat ____3______ times. Complete this exercise ___2_______ times a day. External shoulder rotation You will use an exercise band secured to a stable object at waist height for this exercise. A door and doorframe work well. 1. Stand sideways next to a door with your left / right arm away from the door, holding the exercise band in your hand. 2. With your elbow bent in a 90-degree angle (right angle) and keeping your elbow at your side, swing your arm away from your body (external rotation). ? Make sure your wrist is staying straight as you do this exercise. 3. Hold this position for ____15______ seconds. 4. Slowly return to the starting position. Repeat _____3_____ times. Complete this exercise ______2____ times a day. External shoulder rotation, side-lying You will use a weight to do this exercise. 1. Lie on your uninjured side with your  left / right arm at your side. Bend your elbow to a 90-degree angle (right angle). Hold a _____none_____ weight in your left / right hand. 2. Keeping your elbow at your side, raise your arm toward the ceiling (external rotation). ? Make sure your wrist is staying straight as you do this exercise. 3. Hold this position for ___15_______ seconds. 4. Slowly return to the starting position. Repeat ___3_______ times. Complete this exercise ______2____ times a day. Scapular retraction Scapular retraction is the process of pulling the shoulder blades (scapulae) toward each other, and toward the spine. You will need an exercise band to do this exercise. 1. Sit in a stable chair without armrests, or stand up. 2. Secure an exercise band to a stable object in front of you so the band is at shoulder  height. 3. Hold one end of the exercise band in each hand. 4. Squeeze your shoulder blades together and move your elbows slightly behind you (retraction). Do not shrug your shoulders upward while you do this. 5. Hold this position for ___15_______ seconds. 6. Slowly return to the starting position. Repeat _____3_____ times. Complete this exercise _____2_____ times a day.   Scapular protraction, supine Scapular protraction is the process of moving your shoulder blades away from each other, and away from the spine, while you lie on your back (supine position). 1. Lie on your back on a firm surface. Hold a __none________ weight in your left / right hand. 2. Raise your left / right arm straight into the air so your hand is directly above your shoulder joint. 3. Push the weight into the air so your shoulder (scapula) lifts off the surface that you are lying on. Think of trying to punch the ceiling by only moving your scapula forward (protraction). Do not move your head, neck, or back. 4. Hold this position for ____15______ seconds. 5. Slowly return to the starting position. Repeat ___3_______ times. Complete this exercise __2________ times a day.   This information is not intended to replace advice given to you by your health care provider. Make sure you discuss any questions you have with your health care provider. Document Revised: 03/12/2019 Document Reviewed: 11/26/2018 Elsevier Patient Education  2021 [place]. https://[email or web address].[email or web address]/conditions/ganglion-cyst"> https://www.clinicalkey.com">  Ganglion Cyst  A ganglion cyst is a non-cancerous, fluid-filled lump of tissue that occurs near a joint, tendon, or ligament. The cyst grows out of a joint or the lining of a tendon or ligament. Ganglion cysts most often develop in the hand or wrist, but they can also develop in the shoulder, elbow, hip, knee, ankle, or foot. Ganglion cysts are ball-shaped or egg-shaped. Their size can range  from the size of a pea to larger than a grape. Increased activity may cause the cyst to get bigger because more fluid starts to build up. What are the causes? The exact cause of this condition is not known, but it may be related to:  Inflammation or irritation around the joint.  An injury or tear in the layers of tissue around the joint (joint capsule).  Repetitive movements or overuse.  History of acute or repeated injury. What increases the risk? You are more likely to develop this condition if:  You are a female.  You are 48-20 years old. What are the signs or symptoms? The main symptom of this condition is a lump. It most often appears on the hand or wrist. In many cases, there are no other symptoms, but a cyst can sometimes cause:  Tingling.  Pain  or tenderness.  Numbness.  Weakness or loss of strength in the affected joint.  Decreased range of motion in the affected area of the body.   How is this diagnosed? Ganglion cysts are usually diagnosed based on a physical exam. Your health care provider will feel the lump and may shine a light next to it. If it is a ganglion cyst, the light will likely shine through it. Your health care provider may order an X-ray, ultrasound, MRI, or CT scan to rule out other conditions. How is this treated? Ganglion cysts often go away on their own without treatment. If you have pain or other symptoms, treatment may be needed. Treatment is also needed if the ganglion cyst limits your movement or if it gets infected. Treatment may include:  Wearing a brace or splint on your wrist or finger.  Taking anti-inflammatory medicine.  Having fluid drained from the lump with a needle (aspiration).  Getting an injection of medicine into the joint to decrease inflammation. This may be corticosteroids, ethanol, or hyaluronidase.  Having surgery to remove the ganglion cyst.  Placing a pad in your shoe or wearing shoes that will not rub against the cyst  if it is on your foot. Follow these instructions at home:  Do not press on the ganglion cyst, poke it with a needle, or hit it.  Take over-the-counter and prescription medicines only as told by your health care provider.  If you have a brace or splint: ? Wear it as told by your health care provider. ? Remove it as told by your health care provider. Ask if you need to remove it when you take a shower or a bath.  Watch your ganglion cyst for any changes.  Keep all follow-up visits as told by your health care provider. This is important. Contact a health care provider if:  Your ganglion cyst becomes larger or more painful.  You have pus coming from the lump.  You have weakness or numbness in the affected area.  You have a fever or chills. Get help right away if:  You have a fever and have any of these in the cyst area: ? Increased redness. ? Red streaks. ? Swelling. Summary  A ganglion cyst is a non-cancerous, fluid-filled lump that occurs near a joint, tendon, or ligament.  Ganglion cysts most often develop in the hand or wrist, but they can also develop in the shoulder, elbow, hip, knee, ankle, or foot.  Ganglion cysts often go away on their own without treatment. This information is not intended to replace advice given to you by your health care provider. Make sure you discuss any questions you have with your health care provider. Document Revised: 02/05/2020 Document Reviewed: 02/05/2020 Elsevier Patient Education  2021 [place] Tenosynovitis  De Quervain's tenosynovitis is a condition that causes inflammation of the tendon on the thumb side of the wrist. Tendons are cords of tissue that connect bones to muscles. The tendons in the hand pass through a tunnel called a sheath. A slippery layer of tissue (synovium) lets the tendons move smoothly in the sheath. With de Quervain's tenosynovitis, the sheath swells or thickens, causing friction and pain. The  condition is also called de Quervain's disease and de Quervain's syndrome. It occurs most often in women who are 85-74 years old. What are the causes? The exact cause of this condition is not known. It may be associated with overuse of the hand and wrist. What increases the risk? You are  more likely to develop this condition if you:  Use your hands far more than normal, especially if you repeat certain movements that involve twisting your hand or using a tight grip.  Are pregnant.  Are a middle-aged woman.  Have rheumatoid arthritis.  Have diabetes. What are the signs or symptoms? The main symptom of this condition is pain on the thumb side of the wrist. The pain may get worse when you grasp something or turn your wrist. Other symptoms may include:  Pain that extends up the forearm.  Swelling of your wrist and hand.  Trouble moving the thumb and wrist.  A sensation of snapping in the wrist.  A bump filled with fluid (cyst) in the area of the pain. How is this diagnosed? This condition may be diagnosed based on:  Your symptoms and medical history.  A physical exam. During the exam, your health care provider may do a simple test [place] test) that involves pulling your thumb and wrist to see if this causes pain. You may also need to have an X-ray or ultrasound. How is this treated? Treatment for this condition may include:  Avoiding any activity that causes pain and swelling.  Taking medicines. Anti-inflammatory medicines and corticosteroid injections may be used to reduce inflammation and relieve pain.  Wearing a splint.  Having surgery. This may be needed if other treatments do not work. Once the pain and swelling have gone down, you may start:  Physical therapy. This includes exercises to improve movement and strength in your wrist and thumb.  Occupational therapy. This includes adjusting how you move your wrist. Follow these instructions at home: If you have a  splint:  Wear the splint as told by your health care provider. Remove it only as told by your health care provider.  Loosen the splint if your fingers tingle, become numb, or turn cold and blue.  Keep the splint clean.  If the splint is not waterproof: ? Do not let it get wet. ? Cover it with a watertight covering when you take a bath or a shower. Managing pain, stiffness, and swelling  Avoid movements and activities that cause pain and swelling in the wrist area.  If directed, put ice on the painful area. This may be helpful after doing activities that involve the sore wrist. To do this: ? Put ice in a plastic bag. ? Place a towel between your skin and the bag. ? Leave the ice on for 20 minutes, 2-3 times a day. ? Remove the ice if your skin turns bright red. This is very important. If you cannot feel pain, heat, or cold, you have a greater risk of damage to the area.  Move your fingers often to reduce stiffness and swelling.  Raise (elevate) the injured area above the level of your heart while you are sitting or lying down.   General instructions  Return to your normal activities as told by your health care provider. Ask your health care provider what activities are safe for you.  Take over-the-counter and prescription medicines only as told by your health care provider.  Keep all follow-up visits. This is important. Contact a health care provider if:  Your pain medicine does not help.  Your pain gets worse.  You develop new symptoms. Summary  De Quervain's tenosynovitis is a condition that causes inflammation of the tendon on the thumb side of the wrist.  The condition occurs most often in women who are 53-9 years old.  The exact  cause of this condition is not known. It may be associated with overuse of the hand and wrist.  Treatment starts with avoiding activity that causes pain or swelling in the wrist area. Other treatments may include wearing a splint and taking  medicine. Sometimes, surgery is needed. This information is not intended to replace advice given to you by your health care provider. Make sure you discuss any questions you have with your health care provider. Document Revised: 02/26/2020 Document Reviewed: 02/26/2020 Elsevier Patient Education  2021 [place] for [employer] Calories are units of energy. Your body needs a certain number of calories from food to keep going throughout the day. When you eat or drink more calories than your body needs, your body stores the extra calories mostly as fat. When you eat or drink fewer calories than your body needs, your body burns fat to get the energy it needs. Calorie counting means keeping track of how many calories you eat and drink each day. Calorie counting can be helpful if you need to lose weight. If you eat fewer calories than your body needs, you should lose weight. Ask your health care provider what a healthy weight is for you. For calorie counting to work, you will need to eat the right number of calories each day to lose a healthy amount of weight per week. A dietitian can help you figure out how many calories you need in a day and will suggest ways to reach your calorie goal.  A healthy amount of weight to lose each week is usually 1-2 lb (0.5-0.9 kg). This usually means that your daily calorie intake should be reduced by 500-750 calories.  Eating 1,200-1,500 calories a day can help most women lose weight.  Eating 1,500-1,800 calories a day can help most men lose weight. What do I need to know about calorie counting? Work with your health care provider or dietitian to determine how many calories you should get each day. To meet your daily calorie goal, you will need to:  Find out how many calories are in each food that you would like to eat. Try to do this before you eat.  Decide how much of the food you plan to eat.  Keep a food log. Do this by writing down what  you ate and how many calories it had. To successfully lose weight, it is important to balance calorie counting with a healthy lifestyle that includes regular activity. Where do I find calorie information? The number of calories in a food can be found on a Nutrition Facts label. If a food does not have a Nutrition Facts label, try to look up the calories online or ask your dietitian for help. Remember that calories are listed per serving. If you choose to have more than one serving of a food, you will have to multiply the calories per serving by the number of servings you plan to eat. For example, the label on a package of bread might say that a serving size is 1 slice and that there are 90 calories in a serving. If you eat 1 slice, you will have eaten 90 calories. If you eat 2 slices, you will have eaten 180 calories.   How do I keep a food log? After each time that you eat, record the following in your food log as soon as possible:  What you ate. Be sure to include toppings, sauces, and other extras on the food.  How much you ate.  This can be measured in cups, ounces, or number of items.  How many calories were in each food and drink.  The total number of calories in the food you ate. Keep your food log near you, such as in a pocket-sized notebook or on an app or website on your mobile phone. Some programs will calculate calories for you and show you how many calories you have left to meet your daily goal. What are some portion-control tips?  Know how many calories are in a serving. This will help you know how many servings you can have of a certain food.  Use a measuring cup to measure serving sizes. You could also try weighing out portions on a kitchen scale. With time, you will be able to estimate serving sizes for some foods.  Take time to put servings of different foods on your favorite plates or in your favorite bowls and cups so you know what a serving looks like.  Try not to eat  straight from a food's packaging, such as from a bag or box. Eating straight from the package makes it hard to see how much you are eating and can lead to overeating. Put the amount you would like to eat in a cup or on a plate to make sure you are eating the right portion.  Use smaller plates, glasses, and bowls for smaller portions and to prevent overeating.  Try not to multitask. For example, avoid watching TV or using your computer while eating. If it is time to eat, sit down at a table and enjoy your food. This will help you recognize when you are full. It will also help you be more mindful of what and how much you are eating. What are tips for following this plan? Reading food labels  Check the calorie count compared with the serving size. The serving size may be smaller than what you are used to eating.  Check the source of the calories. Try to choose foods that are high in protein, fiber, and vitamins, and low in saturated fat, trans fat, and sodium. Shopping  Read nutrition labels while you shop. This will help you make healthy decisions about which foods to buy.  Pay attention to nutrition labels for low-fat or fat-free foods. These foods sometimes have the same number of calories or more calories than the full-fat versions. They also often have added sugar, starch, or salt to make up for flavor that was removed with the fat.  Make a grocery list of lower-calorie foods and stick to it. Cooking  Try to cook your favorite foods in a healthier way. For example, try baking instead of frying.  Use low-fat dairy products. Meal planning  Use more fruits and vegetables. One-half of your plate should be fruits and vegetables.  Include lean proteins, such as chicken, [place], and fish. Lifestyle Each week, aim to do one of the following:  150 minutes of moderate exercise, such as walking.  75 minutes of vigorous exercise, such as running. General information  Know how many calories  are in the foods you eat most often. This will help you calculate calorie counts faster.  Find a way of tracking calories that works for you. Get creative. Try different apps or programs if writing down calories does not work for you. What foods should I eat?  Eat nutritious foods. It is better to have a nutritious, high-calorie food, such as an avocado, than a food with few nutrients, such as a bag of  potato chips.  Use your calories on foods and drinks that will fill you up and will not leave you hungry soon after eating. ? Examples of foods that fill you up are nuts and nut butters, vegetables, lean proteins, and high-fiber foods such as whole grains. High-fiber foods are foods with more than 5 g of fiber per serving.  Pay attention to calories in drinks. Low-calorie drinks include water and unsweetened drinks. The items listed above may not be a complete list of foods and beverages you can eat. Contact a dietitian for more information.   What foods should I limit? Limit foods or drinks that are not good sources of vitamins, minerals, or protein or that are high in unhealthy fats. These include:  Candy.  Other sweets.  Sodas, specialty coffee drinks, alcohol, and juice. The items listed above may not be a complete list of foods and beverages you should avoid. Contact a dietitian for more information. How do I count calories when eating out?  Pay attention to portions. Often, portions are much larger when eating out. Try these tips to keep portions smaller: ? Consider sharing a meal instead of getting your own. ? If you get your own meal, eat only half of it. Before you start eating, ask for a container and put half of your meal into it. ? When available, consider ordering smaller portions from the menu instead of full portions.  Pay attention to your food and drink choices. Knowing the way food is cooked and what is included with the meal can help you eat fewer calories. ? If calories  are listed on the menu, choose the lower-calorie options. ? Choose dishes that include vegetables, fruits, whole grains, low-fat dairy products, and lean proteins. ? Choose items that are boiled, broiled, grilled, or steamed. Avoid items that are buttered, battered, fried, or served with cream sauce. Items labeled as crispy are usually fried, unless stated otherwise. ? Choose water, low-fat milk, unsweetened iced tea, or other drinks without added sugar. If you want an alcoholic beverage, choose a lower-calorie option, such as a glass of wine or light beer. ? Ask for dressings, sauces, and syrups on the side. These are usually high in calories, so you should limit the amount you eat. ? If you want a salad, choose a garden salad and ask for grilled meats. Avoid extra toppings such as bacon, cheese, or fried items. Ask for the dressing on the side, or ask for olive oil and vinegar or lemon to use as dressing.  Estimate how many servings of a food you are given. Knowing serving sizes will help you be aware of how much food you are eating at restaurants. Where to find more information  Centers for Disease Control and Prevention: http://www.wolf.info/  [place]. Department of Agriculture: http://www.wilson-mendoza.org/ Summary  Calorie counting means keeping track of how many calories you eat and drink each day. If you eat fewer calories than your body needs, you should lose weight.  A healthy amount of weight to lose per week is usually 1-2 lb (0.5-0.9 kg). This usually means reducing your daily calorie intake by 500-750 calories.  The number of calories in a food can be found on a Nutrition Facts label. If a food does not have a Nutrition Facts label, try to look up the calories online or ask your dietitian for help.  Use smaller plates, glasses, and bowls for smaller portions and to prevent overeating.  Use your calories on foods and drinks that will  fill you up and not leave you hungry shortly after a meal. This information is  not intended to replace advice given to you by your health care provider. Make sure you discuss any questions you have with your health care provider. Document Revised: 12/26/2019 Document Reviewed: 12/26/2019 Elsevier Patient Education  2021 [place].  [email or web address]">  DASH Eating Plan DASH stands for Dietary Approaches to Stop Hypertension. The DASH eating plan is a healthy eating plan that has been shown to:  Reduce high blood pressure (hypertension).  Reduce your risk for type 2 diabetes, heart disease, and stroke.  Help with weight loss. What are tips for following this plan? Reading food labels  Check food labels for the amount of salt (sodium) per serving. Choose foods with less than 5 percent of the Daily Value of sodium. Generally, foods with less than 300 milligrams (mg) of sodium per serving fit into this eating plan.  To find whole grains, look for the word "whole" as the first word in the ingredient list. Shopping  Buy products labeled as "low-sodium" or "no salt added."  Buy fresh foods. Avoid canned foods and pre-made or frozen meals. Cooking  Avoid adding salt when cooking. Use salt-free seasonings or herbs instead of table salt or sea salt. Check with your health care provider or pharmacist before using salt substitutes.  Do not fry foods. Cook foods using healthy methods such as baking, boiling, grilling, roasting, and broiling instead.  Cook with heart-healthy oils, such as olive, canola, avocado, soybean, or sunflower oil. Meal planning  Eat a balanced diet that includes: ? 4 or more servings of fruits and 4 or more servings of vegetables each day. Try to fill one-half of your plate with fruits and vegetables. ? 6-8 servings of whole grains each day. ? Less than 6 oz (170 g) of lean meat, poultry, or fish each day. A 3-oz (85-g) serving of meat is about the same size as a deck of cards. One egg equals 1 oz  (28 g). ? 2-3 servings of low-fat dairy each day. One serving is 1 cup (237 mL). ? 1 serving of nuts, seeds, or beans 5 times each week. ? 2-3 servings of heart-healthy fats. Healthy fats called omega-3 fatty acids are found in foods such as walnuts, flaxseeds, fortified milks, and eggs. These fats are also found in cold-water fish, such as sardines, salmon, and mackerel.  Limit how much you eat of: ? Canned or prepackaged foods. ? Food that is high in trans fat, such as some fried foods. ? Food that is high in saturated fat, such as fatty meat. ? Desserts and other sweets, sugary drinks, and other foods with added sugar. ? Full-fat dairy products.  Do not salt foods before eating.  Do not eat more than 4 egg yolks a week.  Try to eat at least 2 vegetarian meals a week.  Eat more home-cooked food and less restaurant, buffet, and fast food.   Lifestyle  When eating at a restaurant, ask that your food be prepared with less salt or no salt, if possible.  If you drink alcohol: ? Limit how much you use to:  0-1 drink a day for women who are not pregnant.  0-2 drinks a day for men. ? Be aware of how much alcohol is in your drink. In the [place] one drink equals one 12 oz bottle of beer (355 mL), one 5 oz glass of wine (148 mL), or one 1 oz glass of hard liquor (  44 mL). General information  Avoid eating more than 2,300 mg of salt a day. If you have hypertension, you may need to reduce your sodium intake to 1,500 mg a day.  Work with your health care provider to maintain a healthy body weight or to lose weight. Ask what an ideal weight is for you.  Get at least 30 minutes of exercise that causes your heart to beat faster (aerobic exercise) most days of the week. Activities may include walking, swimming, or biking.  Work with your health care provider or dietitian to adjust your eating plan to your individual calorie needs. What foods should I eat? Fruits All fresh, dried, or frozen  fruit. Canned fruit in natural juice (without added sugar). Vegetables Fresh or frozen vegetables (raw, steamed, roasted, or grilled). Low-sodium or reduced-sodium tomato and vegetable juice. Low-sodium or reduced-sodium tomato sauce and tomato paste. Low-sodium or reduced-sodium canned vegetables. Grains Whole-grain or whole-wheat bread. Whole-grain or whole-wheat pasta. Brown rice. [place]. Bulgur. Whole-grain and low-sodium cereals. Pita bread. Low-fat, low-sodium crackers. Whole-wheat flour tortillas. Meats and other proteins Skinless chicken or [place]. Ground chicken or [place]. Pork with fat trimmed off. Fish and seafood. Egg whites. Dried beans, peas, or lentils. Unsalted nuts, nut butters, and seeds. Unsalted canned beans. Lean cuts of beef with fat trimmed off. Low-sodium, lean precooked or cured meat, such as sausages or meat loaves. Dairy Low-fat (1%) or fat-free (skim) milk. Reduced-fat, low-fat, or fat-free cheeses. Nonfat, low-sodium ricotta or cottage cheese. Low-fat or nonfat yogurt. Low-fat, low-sodium cheese. Fats and oils Soft margarine without trans fats. Vegetable oil. Reduced-fat, low-fat, or light mayonnaise and salad dressings (reduced-sodium). Canola, safflower, olive, avocado, soybean, and sunflower oils. Avocado. Seasonings and condiments Herbs. Spices. Seasoning mixes without salt. Other foods Unsalted popcorn and pretzels. Fat-free sweets. The items listed above may not be a complete list of foods and beverages you can eat. Contact a dietitian for more information. What foods should I avoid? Fruits Canned fruit in a light or heavy syrup. Fried fruit. Fruit in cream or butter sauce. Vegetables Creamed or fried vegetables. Vegetables in a cheese sauce. Regular canned vegetables (not low-sodium or reduced-sodium). Regular canned tomato sauce and paste (not low-sodium or reduced-sodium). Regular tomato and vegetable juice (not low-sodium or reduced-sodium).  Angie Fava. Olives. Grains Baked goods made with fat, such as croissants, muffins, or some breads. Dry pasta or rice meal packs. Meats and other proteins Fatty cuts of meat. Ribs. Fried meat. Berniece Salines. Bologna, salami, and other precooked or cured meats, such as sausages or meat loaves. Fat from the back of a pig (fatback). Bratwurst. Salted nuts and seeds. Canned beans with added salt. Canned or smoked fish. Whole eggs or egg yolks. Chicken or [place] with skin. Dairy Whole or 2% milk, cream, and half-and-half. Whole or full-fat cream cheese. Whole-fat or sweetened yogurt. Full-fat cheese. Nondairy creamers. Whipped toppings. Processed cheese and cheese spreads. Fats and oils Butter. Stick margarine. Lard. Shortening. Ghee. Bacon fat. Tropical oils, such as coconut, palm kernel, or palm oil. Seasonings and condiments Onion salt, garlic salt, seasoned salt, table salt, and sea salt. Worcestershire sauce. Tartar sauce. Barbecue sauce. Teriyaki sauce. Soy sauce, including reduced-sodium. Steak sauce. Canned and packaged gravies. Fish sauce. Oyster sauce. Cocktail sauce. Store-bought horseradish. Ketchup. Mustard. Meat flavorings and tenderizers. Bouillon cubes. Hot sauces. Pre-made or packaged marinades. Pre-made or packaged taco seasonings. Relishes. Regular salad dressings. Other foods Salted popcorn and pretzels. The items listed above may not be a complete list of foods and  beverages you should avoid. Contact a dietitian for more information. Where to find more information  National Heart, Lung, and Blood Institute: https://wilson-eaton.com/  American Heart Association: www.heart.org  Academy of Nutrition and Dietetics: www.eatright.[place]: www.kidney.org Summary  The DASH eating plan is a healthy eating plan that has been shown to reduce high blood pressure (hypertension). It may also reduce your risk for type 2 diabetes, heart disease, and stroke.  When on the DASH eating  plan, aim to eat more fresh fruits and vegetables, whole grains, lean proteins, low-fat dairy, and heart-healthy fats.  With the DASH eating plan, you should limit salt (sodium) intake to 2,300 mg a day. If you have hypertension, you may need to reduce your sodium intake to 1,500 mg a day.  Work with your health care provider or dietitian to adjust your eating plan to your individual calorie needs. This information is not intended to replace advice given to you by your health care provider. Make sure you discuss any questions you have with your health care provider. Document Revised: 10/18/2019 Document Reviewed: 10/18/2019 Elsevier Patient Education  2021 [place].  Preventing Hypertension Hypertension, also called high blood pressure, is when the force of blood pumping through the arteries is too strong. Arteries are blood vessels that carry blood from the heart throughout the body. Often, hypertension does not cause symptoms until blood pressure is very high. It is important to have your blood pressure checked regularly. Diet and lifestyle changes can help you prevent hypertension, and they may make you feel better overall and improve your quality of life. If you already have hypertension, you may control it with diet and lifestyle changes, as well as with medicine. How can this condition affect me? Over time, hypertension can damage the arteries and decrease blood flow to important parts of the body, including the brain, heart, and kidneys. By keeping your blood pressure in a healthy range, you can help prevent complications like heart attack, heart failure, stroke, kidney failure, and vascular dementia. What can increase my risk?  Being an older adult. Older people are more often affected.  Having family members who have had high blood pressure.  Being obese.  Being female. Males are more likely to have high blood pressure.  Drinking too much alcohol or caffeine.  Smoking or using  illegal drugs.  Taking certain medicines, such as antidepressants, decongestants, birth control pills, and NSAIDs, such as ibuprofen.  Having thyroid problems.  Having certain tumors. What actions can I take to prevent or manage this condition? Work with your health care provider to make a hypertension prevention plan that works for you. Follow your plan and keep all follow-up visits as told by your health care provider. Diet changes Maintain a healthy diet. This includes:  Eating less salt (sodium). Ask your health care provider how much sodium is safe for you to have. The general recommendation is to have less than 1 tsp (2,300 mg) of sodium a day. ? Do not add salt to your food. ? Choose low-sodium options when grocery shopping and eating out.  Limiting fats in your diet. You can do this by eating low-fat or fat-free dairy products and by eating less red meat.  Eating more fruits, vegetables, and whole grains. Make a goal to eat: ? 1-2 cups of fresh fruits and vegetables each day. ? 3-4 servings of whole grains each day.  Avoiding foods and beverages that have added sugars.  Eating fish that contain healthy fats (  omega-3 fatty acids), such as mackerel or salmon. If you need help putting together a healthy eating plan, try the DASH diet. This diet is high in fruits, vegetables, and whole grains. It is low in sodium, red meat, and added sugars. DASH stands for Dietary Approaches to Stop Hypertension.   Lifestyle changes Lose weight if you are overweight. Losing just 3?5% of your body weight can help prevent or control hypertension. For example, if your present weight is 200 lb (91 kg), a loss of 3-5% of your weight means losing 6-10 lb (2.7-4.5 kg). Ask your health care provider to help you with a diet and exercise plan to safely lose weight. Other recommendations usually include:  Get enough exercise. Do at least 150 minutes of moderate-intensity exercise each week. You could do this  in short exercise sessions several times a day, or you could do longer exercise sessions a few times a week. For example, you could take a brisk 10-minute walk or bike ride, 3 times a day, for 5 days a week.  Find ways to reduce stress, such as exercising, meditating, listening to music, or taking a yoga class. If you need help reducing stress, ask your health care provider.  Do not use any products that contain nicotine or tobacco, such as cigarettes, e-cigarettes, and chewing tobacco. If you need help quitting, ask your health care provider. Chemicals in tobacco and nicotine products raise your blood pressure each time you use them. If you need help quitting, ask your health care provider.  Learn how to check your blood pressure at home. Make sure that you know your personal target blood pressure, as told by your health care provider.  Try to sleep 7-9 hours per night.   Alcohol use  Do not drink alcohol if: ? Your health care provider tells you not to drink. ? You are pregnant, may be pregnant, or are planning to become pregnant.  If you drink alcohol: ? Limit how much you use to:  0-1 drink a day for women.  0-2 drinks a day for men. ? Be aware of how much alcohol is in your drink. In the [place] one drink equals one 12 oz bottle of beer (355 mL), one 5 oz glass of wine (148 mL), or one 1 oz glass of hard liquor (44 mL). Medicines In addition to diet and lifestyle changes, your health care provider may recommend medicines to help lower your blood pressure. In general:  You may need to try a few different medicines to find what works best for you.  You may need to take more than one medicine.  Take over-the-counter and prescription medicines only as told by your health care provider. Questions to ask your health care provider  What is my blood pressure goal?  How can I lower my risk for high blood pressure?  How should I monitor my blood pressure at home? Where to find  support Your health care provider can help you prevent hypertension and help you keep your blood pressure at a healthy level. Your local hospital or your community may also provide support services and prevention programs. The American Heart Association offers an online support network at supportnetwork.heart.org Where to find more information Learn more about hypertension from:  [place], Lung, and [employer]: https://wilson-eaton.com/  Centers for Disease Control and Prevention: http://www.wolf.info/  American Academy of Family Physicians: familydoctor.org Learn more about the DASH diet from:  [place], Lung, and [place]: https://wilson-eaton.com/ Contact a health care provider  if:  You think you are having a reaction to medicines you have taken.  You have recurrent headaches or feel dizzy.  You have swelling in your ankles.  You have trouble with your vision. Get help right away if:  You have sudden, severe chest, back, or abdominal pain or discomfort.  You have shortness of breath.  You have a sudden, severe headache. These symptoms may represent a serious problem that is an emergency. Do not wait to see if the symptoms will go away. Get medical help right away. Call your local emergency services (911 in the [place] Do not drive yourself to the hospital.  Summary  Hypertension often does not cause any symptoms until blood pressure is very high. It is important to get your blood pressure checked regularly.  Diet and lifestyle changes are important steps in preventing hypertension.  By keeping your blood pressure in a healthy range, you may prevent complications like heart attack, heart failure, stroke, and kidney failure.  Work with your health care provider to make a hypertension prevention plan that works for you. This information is not intended to replace advice given to you by your health care provider. Make sure you discuss any questions you have with your health care  provider. Document Revised: 10/15/2019 Document Reviewed: 10/15/2019 Elsevier Patient Education  2021 [employer].

## 2021-07-20 ENCOUNTER — Other Ambulatory Visit: Payer: Self-pay

## 2021-07-20 ENCOUNTER — Ambulatory Visit: Payer: Self-pay | Admitting: Registered Nurse

## 2021-07-20 VITALS — BP 144/96

## 2021-07-20 DIAGNOSIS — R200 Anesthesia of skin: Secondary | ICD-10-CM

## 2021-07-20 DIAGNOSIS — R030 Elevated blood-pressure reading, without diagnosis of hypertension: Secondary | ICD-10-CM

## 2021-07-20 DIAGNOSIS — R202 Paresthesia of skin: Secondary | ICD-10-CM

## 2021-07-20 DIAGNOSIS — M659 Synovitis and tenosynovitis, unspecified: Secondary | ICD-10-CM

## 2021-07-20 NOTE — Progress Notes (Addendum)
Subjective:    Patient ID: Kimberly Hall, female    DOB: [birth year], 60 y.o.   MRN: [id number]  59y/o Caucasian established female pt presenting for f/u L hand and wrist pain. Seen 04/29/21 originally. Pain continues in L anterior wrist and now into thumb. Numbness and tingling in palm and 4th and 5th fingers of L hand. Pt reports numbness/tingling and pain is always present but worsens with basically any use of L hand/forearm. R hand pain worsening now as well. Pain just in thumb into distal wrist on R.   Wearing wrist splints for sleeping not at work.  Some swelling noted left palm.  Pain 9/10 today, ibuprofen helps.  Feels weak in left hand. Right hand dominant.  Was off work x 2 months to assist daughter to care for new baby.  Returned to work last week.  Typically holding product in left hand and tapping with pen right.  Established with Dr Amedeo Plenty [facility] hasn't scheduled follow up appt.  Patient consented to video visit and entire visit completed via video.  Duration of video visit 7 minutes.  I spent 12 minutes dedicated to the care of this patient on the date of this encounter to include pre-visit review of Epic encounters during the past year, results review, care everywhere, allergies, medical history, surgical history, family medical history, medications; face to face time with the patient, and post visit ordering of medications and writing patient instructions/attaching handouts to my chart account.      Review of Systems  Constitutional:  Positive for activity change. Negative for appetite change, chills, diaphoresis, fatigue and fever.  HENT:  Negative for trouble swallowing and voice change.   Eyes:  Negative for photophobia and visual disturbance.  Respiratory:  Negative for cough, shortness of breath, wheezing and stridor.   Cardiovascular:  Negative for chest pain.  Gastrointestinal:  Negative for diarrhea, nausea and vomiting.  Endocrine: Negative for cold intolerance  and heat intolerance.  Musculoskeletal:  Positive for arthralgias and myalgias. Negative for back pain, gait problem, joint swelling, neck pain and neck stiffness.  Skin:  Negative for color change and rash.  Allergic/Immunologic: Negative for food allergies.  Neurological:  Positive for weakness and numbness. Negative for dizziness, tremors, seizures, syncope, facial asymmetry, speech difficulty, light-headedness and headaches.  Hematological:  Negative for adenopathy. Does not bruise/bleed easily.  Psychiatric/Behavioral:  Negative for agitation, confusion and sleep disturbance.       Objective:   Physical Exam Vitals and nursing note reviewed.  Constitutional:      General: She is awake. She is not in acute distress.    Appearance: Normal appearance. She is well-developed and well-groomed. She is not ill-appearing, toxic-appearing or diaphoretic.  HENT:     Head: Normocephalic and atraumatic.     Jaw: There is normal jaw occlusion.     Salivary Glands: Right salivary gland is not diffusely enlarged. Left salivary gland is not diffusely enlarged.     Right Ear: Hearing and external ear normal.     Left Ear: Hearing and external ear normal.     Nose: Nose normal. No congestion or rhinorrhea.     Mouth/Throat:     Lips: Pink. No lesions.     Mouth: Mucous membranes are moist.     Pharynx: Oropharynx is clear.  Eyes:     General: Lids are normal. Vision grossly intact. Gaze aligned appropriately. No scleral icterus.       Right eye: No discharge.  Left eye: No discharge.     Extraocular Movements: Extraocular movements intact.     Conjunctiva/sclera: Conjunctivae normal.     Pupils: Pupils are equal, round, and reactive to light.  Neck:     Trachea: Trachea and phonation normal. No tracheal deviation.  Cardiovascular:     Rate and Rhythm: Normal rate and regular rhythm.     Pulses:          Radial pulses are 2+ on the right side and 2+ on the left side.  Pulmonary:      Effort: Pulmonary effort is normal. No respiratory distress.     Breath sounds: Normal breath sounds and air entry. No stridor or transmitted upper airway sounds. No wheezing.     Comments: Spoke full sentences without difficulty; no cough observed in exam room; wearing surgical mask in clinic Abdominal:     General: Abdomen is flat.  Musculoskeletal:     Right elbow: No swelling, deformity, effusion or lacerations. Normal range of motion.     Left elbow: No swelling, deformity, effusion or lacerations. Normal range of motion.     Right forearm: No swelling, edema, deformity or lacerations.     Left forearm: No swelling, edema, deformity or lacerations.     Right wrist: No swelling, deformity, effusion, lacerations or crepitus. Decreased range of motion.     Left wrist: No swelling, deformity, effusion, lacerations or crepitus. Decreased range of motion.     Right hand: Tenderness present. No swelling, deformity or lacerations. Normal range of motion. Normal strength. Normal capillary refill.     Left hand: Swelling and tenderness present. No deformity or lacerations. Normal range of motion. Normal strength. Normal capillary refill.       Arms:     Cervical back: Normal range of motion and neck supple. No swelling, edema, deformity, erythema, signs of trauma, lacerations or rigidity.     Comments: Full finger extension/flexion/adduction/abduction equal bilaterally; bilateral + finkelstein's test; strength 5/5; decreased wrist flexion/abduction/adduction bilaterally  Lymphadenopathy:     Head:     Right side of head: No submandibular or preauricular adenopathy.     Left side of head: No submandibular or preauricular adenopathy.     Cervical:     Right cervical: No superficial cervical adenopathy.    Left cervical: No superficial cervical adenopathy.  Skin:    General: Skin is warm and dry.     Capillary Refill: Capillary refill takes less than 2 seconds.     Coloration: Skin is not ashen,  cyanotic, jaundiced, mottled, pale or sallow.     Findings: No abrasion, abscess, acne, bruising, burn, ecchymosis, erythema, signs of injury, laceration, lesion, petechiae, rash or wound.     Nails: There is no clubbing.  Neurological:     General: No focal deficit present.     Mental Status: She is alert and oriented to person, place, and time. Mental status is at baseline.     GCS: GCS eye subscore is 4. GCS verbal subscore is 5. GCS motor subscore is 6.     Cranial Nerves: No dysarthria or facial asymmetry.     Motor: Motor function is intact. No weakness, tremor, atrophy, abnormal muscle tone or seizure activity.     Coordination: Coordination is intact. Coordination normal.     Gait: Gait is intact. Gait normal.     Comments: In/out of chair without difficulty; gait sure and steady in clinic; bilateral hand grasp equal 5/5  Psychiatric:  Attention and Perception: Attention and perception normal.        Mood and Affect: Mood and affect normal.        Speech: Speech normal.        Behavior: Behavior normal. Behavior is cooperative.        Thought Content: Thought content normal.        Cognition and Memory: Cognition and memory normal.        Judgment: Judgment normal.          Assessment & Plan:   A-left hand tingling and numbness; subsequent visit bilateral tenosynovitis of wrists, elevated blood pressure   P-Continue wear of wrist thumb spica splints bilateral previously dispensed especially when sleeping at night.  Continue ibuprofen '800mg'$  po TID prn with food until seen by orthopedics.  Lift fewer items at one time due to weight of product  Discussed positive finkelstein's test and carpal tunnel symptoms left hand.  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  Cryotherapy 15 minutes TID prn pain/swelling. [employer] handouts on carpal tunnel and dequervains tenosynovitis. Medications as directed. No other nsaids while taken ibuprofen e.g. aleve/advil/naproxen/naprosyn/motrin. Suspect overuse soft tissue injury related pain due to repetitive motion (new grandbaby and recently returned to work after two months off)  Patient agreed to see orthopedics had provider preference already established with Dr Delene Ruffini in [place].  Discussed may need steroid injection from orthopedics.  Discussed I cannot write work restrictions would need to see ortho or workers comp clinic provider.  Avoid starting new activities that utilize hands in the next two weeks and then only 15 minutes per day after that time of rest.  Patient verbalized agreement and understanding of treatment plan and had no further questions at this time. P2: ROM, injury prevention   Pain and NSAID use can increase blood pressure.  Overweight--recommend weight loss.  Repeat blood pressure in 2 weeks with RN Hildred Alamin.

## 2021-07-20 NOTE — Patient Instructions (Signed)
Carpal Tunnel Syndrome  Carpal tunnel syndrome is a condition that causes pain, numbness, and weakness in your hand and fingers. The carpal tunnel is a narrow area located on the palm side of your wrist. Repeated wrist motion or certain diseases may cause swelling within the tunnel. This swelling pinches the main nerve in the wrist.The main nerve in the wrist is called the median nerve. What are the causes? This condition may be caused by: Repeated and forceful wrist and hand motions. Wrist injuries. Arthritis. A cyst or tumor in the carpal tunnel. Fluid buildup during pregnancy. Use of tools that vibrate. Sometimes the cause of this condition is not known. What increases the risk? The following factors may make you more likely to develop this condition: Having a job that requires you to repeatedly or forcefully move your wrist or hand or requires you to use tools that vibrate. This may include jobs that involve using computers, working on an [employer], or working with [place] such as Pension scheme manager. Being a woman. Having certain conditions, such as: Diabetes. Obesity. An underactive thyroid (hypothyroidism). Kidney failure. Rheumatoid arthritis. What are the signs or symptoms? Symptoms of this condition include: A tingling feeling in your fingers, especially in your thumb, index, and middle fingers. Tingling or numbness in your hand. An aching feeling in your entire arm, especially when your wrist and elbow are bent for a long time. Wrist pain that goes up your arm to your shoulder. Pain that goes down into your palm or fingers. A weak feeling in your hands. You may have trouble grabbing and holding items. Your symptoms may feel worse during the night. How is this diagnosed? This condition is diagnosed with a medical history and physical exam. You may also have tests, including: Electromyogram (EMG). This test measures electrical signals sent by your nerves into the  muscles. Nerve conduction study. This test measures how well electrical signals pass through your nerves. Imaging tests, such as X-rays, ultrasound, and MRI. These tests check for possible causes of your condition. How is this treated? This condition may be treated with: Lifestyle changes. It is important to stop or change the activity that caused your condition. Doing exercise and activities to strengthen and stretch your muscles and tendons (physical therapy). Making lifestyle changes to help with your condition and learning how to do your daily activities safely (occupational therapy). Medicines for pain and inflammation. This may include medicine that is injected into your wrist. A wrist splint or brace. Surgery. Follow these instructions at home: If you have a splint or brace: Wear the splint or brace as told by your health care provider. Remove it only as told by your health care provider. Loosen the splint or brace if your fingers tingle, become numb, or turn cold and blue. Keep the splint or brace clean. If the splint or brace is not waterproof: Do not let it get wet. Cover it with a watertight covering when you take a bath or shower. Managing pain, stiffness, and swelling If directed, put ice on the painful area. To do this: If you have a removeable splint or brace, remove it as told by your health care provider. Put ice in a plastic bag. Place a towel between your skin and the bag or between the splint or brace and the bag. Leave the ice on for 20 minutes, 2-3 times a day. Do not fall asleep with the cold pack on your skin. Remove the ice if your skin turns bright  red. This is very important. If you cannot feel pain, heat, or cold, you have a greater risk of damage to the area. Move your fingers often to reduce stiffness and swelling. General instructions Take over-the-counter and prescription medicines only as told by your health care provider. Rest your wrist and hand from  any activity that may be causing your pain. If your condition is work related, talk with your employer about changes that can be made, such as getting a wrist pad to use while typing. Do any exercises as told by your health care provider, physical therapist, or occupational therapist. Keep all follow-up visits. This is important. Contact a health care provider if: You have new symptoms. Your pain is not controlled with medicines. Your symptoms get worse. Get help right away if: You have severe numbness or tingling in your wrist or hand. Summary Carpal tunnel syndrome is a condition that causes pain, numbness, and weakness in your hand and fingers. It is usually caused by repeated wrist motions. Lifestyle changes and medicines are used to treat carpal tunnel syndrome. Surgery may be recommended. Follow your health care provider's instructions about wearing a splint, resting from activity, keeping follow-up visits, and calling for help. This information is not intended to replace advice given to you by your health care provider. Make sure you discuss any questions you have with your healthcare provider. Document Revised: 03/26/2020 Document Reviewed: 03/26/2020 Elsevier Patient Education  [place] Tenosynovitis  De Quervain's tenosynovitis is a condition that causes inflammation of the tendon on the thumb side of the wrist. Tendons are cords of tissue that connect bones to muscles. The tendons in the hand pass through a tunnel called a sheath. A slippery layer of tissue (synovium) lets the tendons move smoothly in the sheath. With de Quervain'stenosynovitis, the sheath swells or thickens, causing friction and pain. The condition is also called de Quervain's disease and de Quervain's syndrome.It occurs most often in women who are 61-21 years old. What are the causes? The exact cause of this condition is not known. It may be associated withoveruse of the hand and  wrist. What increases the risk? You are more likely to develop this condition if you: Use your hands far more than normal, especially if you repeat certain movements that involve twisting your hand or using a tight grip. Are pregnant. Are a middle-aged woman. Have rheumatoid arthritis. Have diabetes. What are the signs or symptoms? The main symptom of this condition is pain on the thumb side of the wrist. The pain may get worse when you grasp something or turn your wrist. Other symptoms may include: Pain that extends up the forearm. Swelling of your wrist and hand. Trouble moving the thumb and wrist. A sensation of snapping in the wrist. A bump filled with fluid (cyst) in the area of the pain. How is this diagnosed? This condition may be diagnosed based on: Your symptoms and medical history. A physical exam. During the exam, your health care provider may do a simple test [place] test) that involves pulling your thumb and wrist to see if this causes pain. You may also need to have an X-ray or ultrasound. How is this treated? Treatment for this condition may include: Avoiding any activity that causes pain and swelling. Taking medicines. Anti-inflammatory medicines and corticosteroid injections may be used to reduce inflammation and relieve pain. Wearing a splint. Having surgery. This may be needed if other treatments do not work. Once the pain and swelling have  gone down, you may start: Physical therapy. This includes exercises to improve movement and strength in your wrist and thumb. Occupational therapy. This includes adjusting how you move your wrist. Follow these instructions at home: If you have a splint: Wear the splint as told by your health care provider. Remove it only as told by your health care provider. Loosen the splint if your fingers tingle, become numb, or turn cold and blue. Keep the splint clean. If the splint is not waterproof: Do not let it get wet. Cover it  with a watertight covering when you take a bath or a shower. Managing pain, stiffness, and swelling  Avoid movements and activities that cause pain and swelling in the wrist area. If directed, put ice on the painful area. This may be helpful after doing activities that involve the sore wrist. To do this: Put ice in a plastic bag. Place a towel between your skin and the bag. Leave the ice on for 20 minutes, 2-3 times a day. Remove the ice if your skin turns bright red. This is very important. If you cannot feel pain, heat, or cold, you have a greater risk of damage to the area. Move your fingers often to reduce stiffness and swelling. Raise (elevate) the injured area above the level of your heart while you are sitting or lying down.  General instructions Return to your normal activities as told by your health care provider. Ask your health care provider what activities are safe for you. Take over-the-counter and prescription medicines only as told by your health care provider. Keep all follow-up visits. This is important. Contact a health care provider if: Your pain medicine does not help. Your pain gets worse. You develop new symptoms. Summary De Quervain's tenosynovitis is a condition that causes inflammation of the tendon on the thumb side of the wrist. The condition occurs most often in women who are 69-49 years old. The exact cause of this condition is not known. It may be associated with overuse of the hand and wrist. Treatment starts with avoiding activity that causes pain or swelling in the wrist area. Other treatments may include wearing a splint and taking medicine. Sometimes, surgery is needed. This information is not intended to replace advice given to you by your health care provider. Make sure you discuss any questions you have with your healthcare provider. Document Revised: 02/26/2020 Document Reviewed: 02/26/2020 Elsevier Patient Education  2022 [employer].

## 2021-08-10 ENCOUNTER — Other Ambulatory Visit: Payer: Self-pay

## 2021-08-10 ENCOUNTER — Encounter: Payer: Self-pay | Admitting: Registered Nurse

## 2021-08-10 NOTE — Progress Notes (Signed)
error 

## 2021-09-14 ENCOUNTER — Encounter: Payer: Self-pay | Admitting: Registered Nurse

## 2021-09-14 ENCOUNTER — Telehealth: Payer: Self-pay | Admitting: Registered Nurse

## 2021-09-14 DIAGNOSIS — R200 Anesthesia of skin: Secondary | ICD-10-CM

## 2021-09-14 DIAGNOSIS — R202 Paresthesia of skin: Secondary | ICD-10-CM

## 2021-09-14 DIAGNOSIS — M1812 Unilateral primary osteoarthritis of first carpometacarpal joint, left hand: Secondary | ICD-10-CM | POA: Insufficient documentation

## 2021-09-14 DIAGNOSIS — M720 Palmar fascial fibromatosis [Dupuytren]: Secondary | ICD-10-CM | POA: Insufficient documentation

## 2021-09-14 DIAGNOSIS — M79642 Pain in left hand: Secondary | ICD-10-CM | POA: Insufficient documentation

## 2021-09-14 DIAGNOSIS — M659 Synovitis and tenosynovitis, unspecified: Secondary | ICD-10-CM

## 2021-09-14 DIAGNOSIS — M189 Osteoarthritis of first carpometacarpal joint, unspecified: Secondary | ICD-10-CM | POA: Insufficient documentation

## 2021-09-14 NOTE — Telephone Encounter (Signed)
Patient came to clinic to discuss she saw orthopedics provider and was recommended to having imaging and surgery for finger numbness.  Patient did not want to miss work during holiday season and planning to follow up after Christmas with her orthopedic provider again.  Patient stated she is still elevating, icing and taking motrin after work.  Able to perform all her duties at this time.  Patient last seen 07/20/21 for tenosynovitis bilateral and tingling numbness left hand (probable carpal tunnel).  Encouraged splint wear, elevating/ice/nsaid after work prn and follow up with orthopedics if worsening.  Patient verbalized understanding information/instructions, agreed with plan of care and had no further questions at this time.

## 2021-09-27 ENCOUNTER — Telehealth: Payer: Self-pay | Admitting: *Deleted

## 2021-09-27 ENCOUNTER — Encounter: Payer: Self-pay | Admitting: *Deleted

## 2021-09-27 MED ORDER — XOFLUZA (80 MG DOSE) 1 X 80 MG PO TBPK: 80.0000 mg | ORAL_TABLET | Freq: Once | ORAL | 0 refills | Status: DC

## 2021-09-27 NOTE — Telephone Encounter (Signed)
Reviewed RN Hildred Alamin note, agreed with plan of care.  Repeat covid testing in 48 hours home test.

## 2021-09-27 NOTE — Telephone Encounter (Addendum)
Pt called out work today citing migraine and would be taking Rx'd med and hoping to come in to work later today. Spoke with pt by phone. She reports sx started yesterday 10/30 and worsened with addition of body aches, nausea, and diarrhea today. TMax 38.7C=101.61F yesterday, back to 37.2C=98.50F this morning. Pt reports husband had similar sx last week. Pt with negative covid test today.  She is able to tolerate po fluids at this time. Discussed possible flu Dx, though possible Covid or other circulating viral illnesses. Discussed use of antivirals, tamiflu vs Xofluza. She prefers to start [place]. Wt confirmed as 86kg per pt. Order placed for 80mg  dose x1.  Advised pt if diarrhea continues, she may use Imodium otc if temp under 38C. Repeat home covid test in 48hrs. Pt agreeable with this. Will f/u with pt tomorrow for sx check.

## 2021-09-28 MED ORDER — OSELTAMIVIR PHOSPHATE 75 MG PO CAPS: 75.0000 mg | ORAL_CAPSULE | Freq: Two times a day (BID) | ORAL | 0 refills | Status: DC

## 2021-09-28 NOTE — Telephone Encounter (Signed)
Pt presented to clinic reporting Kimberly Hall was $77 and requesting Rx be changed to Tamiflu.  Pt was instructed during phone call yesterday to not present to work today and that RN would f/u today and repeat covid test would need to occur tomorrow.  Pt reported she did not know or maybe misunderstood instructions not to come in since she felt bad.  Diarrhea continued into late evening yesterday. Pt appears pale and mildly diaphoretic. Skin warm to touch though, not clammy. Temp normal in clinic, 98.0. She is able to tolerate fluids this morning.  Sent pt home from work. Notified pt's supervisor and HR of same.

## 2021-09-30 ENCOUNTER — Other Ambulatory Visit (HOSPITAL_BASED_OUTPATIENT_CLINIC_OR_DEPARTMENT_OTHER): Payer: Self-pay

## 2021-09-30 MED ORDER — OSELTAMIVIR PHOSPHATE 75 MG PO CAPS
75.0000 mg | ORAL_CAPSULE | Freq: Two times a day (BID) | ORAL | 0 refills | Status: DC
  Filled 2021-09-30: qty 10, 5d supply, fill #0

## 2021-09-30 NOTE — Telephone Encounter (Signed)
Called pt for covid test results. She reports test negative 11/2. Still fatigued even with just walking around the house. Denies fever, ShOB, chest pain, runny nose, sore throat.  Pt reports when she went to pharmacy and they told her medicine was $75. She did not pick up for this reason.  RN called pharmacy and was advised that all [facility] are currently out of generic Tamiflu as it is on backorder and $75 was for brand Tamiflu. Called [facility] since close to pt's home address. They confirmed they do have generic oseltamivir in stock. Pt confirmed she was okay with going there to pick up, new Rx placed. Due to continued sx and unable to start tamiflu yet, will keep out of work tomorrow 11/4 with weekend re-eval and hopeful RTW 11/7. Pt already has days scheduled off for 11/7-9 and will RTW 11/10.

## 2021-09-30 NOTE — Telephone Encounter (Signed)
RN Hildred Alamin discussed patient case with me in clinic earlier today.  Electronic Rx signed and sent to new pharmacy due to patient preferred pharmacy out of stock tamiflu 75mg  po BID x 5 days #10 RF0 generic $10 copay.  Will follow up with patient via telephone this weekend.  Agreed with plan of care.

## 2021-10-05 NOTE — Telephone Encounter (Signed)
Reviewed RN Hildred Alamin note patient reported symptoms improved and plans to RTW after PTO as planned.

## 2021-10-05 NOTE — Telephone Encounter (Signed)
Spoke with pt by phone. She reports feeling well and plans to RTW after her PTO days on Thursday 11/10. Denies any questions or concerns.

## 2021-10-07 NOTE — Telephone Encounter (Signed)
Patient RTW today as expected feeling well encounter closed.

## 2021-10-07 NOTE — Telephone Encounter (Signed)
Pt did RTW today 11/10 as planned. Closing encounter.

## 2021-12-15 ENCOUNTER — Other Ambulatory Visit: Payer: Self-pay | Admitting: Nurse Practitioner

## 2021-12-15 DIAGNOSIS — Z1231 Encounter for screening mammogram for malignant neoplasm of breast: Secondary | ICD-10-CM

## 2021-12-20 ENCOUNTER — Ambulatory Visit
Admission: RE | Admit: 2021-12-20 | Discharge: 2021-12-20 | Disposition: A | Discharge status: Home / Self Care | Payer: PRIVATE HEALTH INSURANCE | Source: Ambulatory Visit | Attending: Nurse Practitioner | Admitting: Nurse Practitioner

## 2021-12-20 ENCOUNTER — Other Ambulatory Visit: Payer: Self-pay

## 2021-12-20 DIAGNOSIS — Z1231 Encounter for screening mammogram for malignant neoplasm of breast: Secondary | ICD-10-CM

## 2022-01-28 ENCOUNTER — Encounter: Payer: Self-pay | Admitting: Gastroenterology

## 2022-01-28 ENCOUNTER — Ambulatory Visit: Payer: No Typology Code available for payment source | Admitting: Gastroenterology

## 2022-01-28 VITALS — BP 156/84 | HR 90 | Ht 66.0 in | Wt 197.0 lb

## 2022-01-28 DIAGNOSIS — K625 Hemorrhage of anus and rectum: Secondary | ICD-10-CM

## 2022-01-28 DIAGNOSIS — R1032 Left lower quadrant pain: Secondary | ICD-10-CM | POA: Diagnosis not present

## 2022-01-28 MED ORDER — PLENVU 140 G PO SOLR: 140.0000 g | ORAL | 0 refills | Status: DC

## 2022-01-28 NOTE — Patient Instructions (Addendum)
If you are age 62 or older, your body mass index should be between 23-30. Your Body mass index is 31.8 kg/m?Marland Kitchen If this is out of the aforementioned range listed, please consider follow up with your Primary Care Provider. ? ?If you are age 3 or younger, your body mass index should be between 19-25. Your Body mass index is 31.8 kg/m?Marland Kitchen If this is out of the aformentioned range listed, please consider follow up with your Primary Care Provider.  ? ?________________________________________________________ ? ?The [facility] providers would like to encourage you to use [facility] to communicate with providers for non-urgent requests or questions.  Due to long hold times on the telephone, sending your provider a message by [facility] may be a faster and more efficient way to get a response.  Please allow 48 business hours for a response.  Please remember that this is for non-urgent requests.  ?_______________________________________________________ ? ?You have been scheduled for a colonoscopy. Please follow written instructions given to you at your visit today.  ?Please pick up your prep supplies at the pharmacy within the next 1-3 days. ?If you use inhalers (even only as needed), please bring them with you on the day of your procedure. ? ?_______________________________________________________ ? ?Food Guidelines for those with chronic digestive trouble: ? ?Many people have difficulty digesting certain foods, causing a variety of distressing and embarrassing symptoms such as abdominal pain, bloating and gas.  These foods may need to be avoided or consumed in small amounts.  Here are some tips that might be helpful for you. ? ?1.   Lactose intolerance is the difficulty or complete inability to digest lactose, the natural sugar in milk and anything made from milk.  This condition is harmless, common, and can begin any time during life.  Some people can digest a modest amount of lactose while others cannot tolerate any.  Also, not  all dairy products contain equal amounts of lactose.  For example, hard cheeses such as parmesan have less lactose than soft cheeses such as cheddar.  Yogurt has less lactose than milk or cheese.  Many packaged foods (even many brands of bread) have milk, so read ingredient lists carefully.  It is difficult to test for lactose intolerance, so just try avoiding lactose as much as possible for a week and see what happens with your symptoms.  If you seem to be lactose intolerant, the best plan is to avoid it (but make sure you get calcium from another source).  The next best thing is to use lactase enzyme supplements, available over the counter everywhere.  Just know that many lactose intolerant people need to take several tablets with each serving of dairy to avoid symptoms.  Lastly, a lot of restaurant food is made with milk or butter.  Many are things you might not suspect, such as mashed potatoes, rice and pasta (cooked with butter) and "grilled" items.  If you are lactose intolerant, it never hurts to ask your server what has milk or butter. ? ?2.   Fiber is an important part of your diet, but not all fiber is well-tolerated.  Insoluble fiber such as bran is often consumed by normal gut bacteria and converted into gas.  Soluble fiber such as oats, squash, carrots and green beans are typically tolerated better. ? ?3.   Some types of carbohydrates can be poorly digested.  Examples include: fructose (apples, cherries, pears, raisins and other dried fruits), fructans (onions, zucchini, large amounts of wheat), sorbitol/mannitol/xylitol and sucralose/Splenda (common artificial sweeteners),  and raffinose (lentils, broccoli, cabbage, asparagus, brussel sprouts, many types of beans).  ?Do a Development worker, community for FODMAP diet and you will find helpful information. ?Beano, a dietary supplement, will often help with raffinose-containing foods.  As with lactase tablets, you may need several per serving. ? ?4.   Whenever possible,  avoid processed food&meats and chemical additives.  High fructose corn syrup, a common sweetener, may be difficult to digest.  Eggs and soy (comes from the soybean, and added to many foods now) are other common bloating/gassy foods. ? ?5.  Regarding gluten:  gluten is a protein mainly found in wheat, but also rye and barley.  There is a condition called celiac sprue, which is an inflammatory reaction in the small intestine causing a variety of digestive symptoms.  Blood testing is highly reliable to look for this condition, and sometimes upper endoscopy with small bowel biopsies may be necessary to make the diagnosis.  Many patients who test negative for celiac sprue report improvement in their digestive symptoms when they switch to a gluten-free diet.  However, in these "non-celiac gluten sensitive" patients, the true role of gluten in their symptoms is unclear.  Reducing carbohydrates in general may decrease the gas and bloating caused when gut bacteria consume carbs. Also, some of these patients may actually be intolerant of the baker's yeast in bread products rather than the gluten.  Flatbread and other reduced yeast breads might therefore be tolerated.  There is no specific testing available for most food intolerances, which are discovered mainly by dietary elimination.  Please do not embark on a gluten free diet unless directed by your doctor, as it is highly restrictive, and may lead to nutritional deficiencies if not carefully monitored.  Lastly, beware of internet claims offering "personalized" tests for food intolerances.  Such testing has no reliable scientific evidence to support its reliability and correlation to symptoms.   ? ?6.  The best advice is old advice, especially for those with chronic digestive trouble - try to eat "clean".  Balanced diet, avoid processed food, plenty of fruits and vegetables, cut down the sugar, minimal alcohol, avoid tobacco. ?Make time to care for yourself, get enough  sleep, exercise when you can, reduce stress.  Your guts will thank you for it. ? ? ?- Dr. Wilfrid Lund ?[facility] ? ?____________________________________________________________ ? ? ?  ? ?It was a pleasure to see you today! ? ?Thank you for trusting me with your gastrointestinal care!   ? ? ?

## 2022-01-28 NOTE — Progress Notes (Signed)
? ? ?[facility] Consult Note: ? ?History: ?Kimberly Hall ?01/28/2022 ? ?Referring provider: Bartholome Bill, MD ? ?Reason for consult/chief complaint: Abdominal Pain (Patient c/o LLQ intermittent abd pain for months. ) and Rectal Bleeding (Patient reports seeing a small amount of  blood in underwear one time. ) ? ? ?Subjective  ?HPI: ? ?This is a very pleasant [place] woman referred for left lower quadrant pain and an episode of rectal bleeding.  She reports years of intermittent LLQ pain that is sometimes associated with bloating and gas.  At times her stools are thin and she may have a few per day but other times normal.  About 6 weeks ago she had an episode of this pain and it lasted a couple of weeks without clear explanation.  She wonders if certain foods may be triggering it such as potatoes.  About that time she also had a single episode of painless bleeding or what she believes to been blood, though says maybe it was from some strawberries or tomatoes she ate. ?For several months she has been bothered by chronic dry cough, cannot get into see primary care and so is going to an urgent care later today.  No fevers, hemoptysis or sputum.  She feels like there is something in her throat much of the time and she has a dry cough.  Denies heartburn dysphagia or odynophagia. ?She has felt well without abdominal pain for the last few weeks. ?Colonoscopy by Dr. Deatra Ina February 2016, indication was history of colon polyps (no details on that).  Complete exam with good preparation, no polyps, left-sided diverticulosis.  10-year recall recommended. ? ?ROS: ? ?Review of Systems  ?Constitutional:  Negative for appetite change and unexpected weight change.  ?HENT:  Negative for mouth sores and voice change.   ?Eyes:  Negative for pain and redness.  ?Respiratory:  Positive for cough. Negative for shortness of breath.   ?Cardiovascular:  Negative for chest pain and palpitations.  ?Genitourinary:   Negative for dysuria and hematuria.  ?Musculoskeletal:  Negative for arthralgias and myalgias.  ?Skin:  Negative for pallor and rash.  ?Neurological:  Negative for weakness and headaches.  ?Hematological:  Negative for adenopathy.  ? ? ?Past Medical History: ?Past Medical History:  ?Diagnosis Date  ? Diverticulosis   ? GERD (gastroesophageal reflux disease)   ? Hyperlipemia   ? ? ? ?Past Surgical History: ?Past Surgical History:  ?Procedure Laterality Date  ? BREAST BIOPSY Right   ? neg- core  ? COLONOSCOPY    ? POLYPECTOMY    ? SIGMOIDOSCOPY    ? ? ? ?Family History: ?Family History  ?Problem Relation Age of Onset  ? Lung cancer Father   ? Colon cancer Neg Hx   ? Breast cancer Neg Hx   ? ? ?Social History: ?Social History  ? ?Socioeconomic History  ? Marital status: Married  ?  Spouse name: Not on file  ? Number of children: Not on file  ? Years of education: Not on file  ? Highest education level: Not on file  ?Occupational History  ? Occupation: [place] [employer]  ?  Employer: [employer]  ?Tobacco Use  ? Smoking status: Former  ? Smokeless tobacco: Never  ? Tobacco comments:  ?  Quit 10 yrs ago   ?Vaping Use  ? Vaping Use: Never used  ?Substance and Sexual Activity  ? Alcohol use: Yes  ?  Comment: Wine on occ  ? Drug use: No  ? Sexual activity: Yes  ?Other  Topics Concern  ? Not on file  ?Social History Narrative  ? Caffeine daily   ? ?Social Determinants of Health  ? ?Financial Resource Strain: Not on file  ?Food Insecurity: Not on file  ?Transportation Needs: Not on file  ?Physical Activity: Not on file  ?Stress: Not on file  ?Social Connections: Not on file  ? ? ?Allergies: ?Allergies  ?Allergen Reactions  ? Penicillins   ?  Childhood allergy; unk rxn  ? ? ?Outpatient Meds: ?Current Outpatient Medications  ?Medication Sig Dispense Refill  ? Cholecalciferol (VITAMIN D) 50 MCG (2000 UT) tablet Take 2,000 Units by mouth daily.    ? Magnesium 500 MG TABS Take by mouth.    ? PEG-KCl-NaCl-NaSulf-Na Asc-C (PLENVU) 140  g SOLR Take 140 g by mouth as directed. Manufacturer's coupon Universal coupon code:BIN: P2366821; GROUP: [id number]; PCN: [id number]; ID: [id number]; PAY NO MORE $50 1 each 0  ? vitamin B-12 (CYANOCOBALAMIN) 500 MCG tablet Take 500 mcg by mouth daily.    ? vitamin k 100 MCG tablet Take 100 mcg by mouth daily.    ? ?No current facility-administered medications for this visit.  ? ? ? ? ?___________________________________________________________________ ?Objective  ? ?Exam: ? ?BP (!) 156/84   Pulse 90   Ht 5\' 6"  (1.676 m)   Wt 197 lb (89.4 kg)   BMI 31.80 kg/m?  ?Wt Readings from Last 3 Encounters:  ?01/28/22 197 lb (89.4 kg)  ?11/02/20 195 lb (88.5 kg)  ?08/20/20 193 lb (87.5 kg)  ? ? ?General: Well-appearing ?Eyes: sclera anicteric, no redness ?ENT: oral mucosa moist without lesions, no cervical or supraclavicular lymphadenopathy ?CV: RRR without murmur, S1/S2, no JVD, no peripheral edema ?Resp: clear to auscultation bilaterally, normal RR and effort noted ?GI: soft, no tenderness, with active bowel sounds. No guarding or palpable organomegaly noted. ?Skin; warm and dry, no rash or jaundice noted ?Neuro: awake, alert and oriented x 3. Normal gross motor function and fluent speech ? ?Labs: ? ?No recent data for review ?Assessment: ?Encounter Diagnoses  ?Name Primary?  ? LLQ abdominal pain Yes  ? Rectal bleeding   ? ? ?Intermittent pain over years without specific associated symptoms, intermittent bloating and gas.  Single episode of what may have been some rectal bleeding last month. ? ?It sounds like she may have some functional abdominal pain or perhaps painful diverticulosis and some dietary triggers. ?  ? ?Plan: ? ?Colonoscopy.  She was agreeable after discussion of procedure and risks. ?The benefits and risks of the planned procedure were described in detail with the patient or (when appropriate) their health care proxy.  Risks were outlined as including, but not limited to, bleeding, infection, perforation,  adverse medication reaction leading to cardiac or pulmonary decompensation, pancreatitis (if ERCP).  The limitation of incomplete mucosal visualization was also discussed.  No guarantees or warranties were given. ? ?Thank you for the courtesy of this consult.  Please call me with any questions or concerns. ? ?Nelida Meuse III ? ?CC: Referring provider noted above ? ?

## 2022-02-28 ENCOUNTER — Encounter: Payer: Self-pay | Admitting: Gastroenterology

## 2022-03-01 ENCOUNTER — Encounter: Payer: Self-pay | Admitting: Certified Registered Nurse Anesthetist

## 2022-03-07 ENCOUNTER — Encounter: Payer: Self-pay | Admitting: Certified Registered Nurse Anesthetist

## 2022-03-08 ENCOUNTER — Encounter: Payer: Self-pay | Admitting: Gastroenterology

## 2022-03-08 ENCOUNTER — Ambulatory Visit (AMBULATORY_SURGERY_CENTER): Payer: No Typology Code available for payment source | Admitting: Gastroenterology

## 2022-03-08 VITALS — BP 107/69 | HR 68 | Temp 97.7°F | Resp 14 | Ht 66.0 in | Wt 197.0 lb

## 2022-03-08 DIAGNOSIS — D122 Benign neoplasm of ascending colon: Secondary | ICD-10-CM

## 2022-03-08 DIAGNOSIS — R194 Change in bowel habit: Secondary | ICD-10-CM | POA: Diagnosis not present

## 2022-03-08 DIAGNOSIS — R1032 Left lower quadrant pain: Secondary | ICD-10-CM | POA: Diagnosis not present

## 2022-03-08 DIAGNOSIS — D120 Benign neoplasm of cecum: Secondary | ICD-10-CM

## 2022-03-08 DIAGNOSIS — K5730 Diverticulosis of large intestine without perforation or abscess without bleeding: Secondary | ICD-10-CM

## 2022-03-08 MED ORDER — SODIUM CHLORIDE 0.9 % IV SOLN: 500.0000 mL | Freq: Once | INTRAVENOUS | Status: DC

## 2022-03-08 NOTE — Progress Notes (Signed)
Called to room to assist during endoscopic procedure.  Patient ID and intended procedure confirmed with present staff. Received instructions for my participation in the procedure from the performing physician.  

## 2022-03-08 NOTE — Progress Notes (Signed)
History and Physical: ? This patient presents for endoscopic testing for: ?Encounter Diagnosis  ?Name Primary?  ? LLQ abdominal pain Yes  ? ? ?Clinical details in [facility] office consult note of 01/28/2022, no changes since then ?Chronic left lower quadrant abdominal pain, single episode of possible hematochezia versus altered appearance due to red foodstuffs. ? ?ROS: ?Patient denies chest pain or shortness of breath ? ? ?Past Medical History: ?Past Medical History:  ?Diagnosis Date  ? Diverticulosis   ? GERD (gastroesophageal reflux disease)   ? Hyperlipemia   ? ? ? ?Past Surgical History: ?Past Surgical History:  ?Procedure Laterality Date  ? BREAST BIOPSY Right   ? neg- core  ? COLONOSCOPY    ? POLYPECTOMY    ? SIGMOIDOSCOPY    ? ? ?Allergies: ?Allergies  ?Allergen Reactions  ? Penicillins   ?  Childhood allergy; unk rxn  ? ? ?Outpatient Meds: ?Current Outpatient Medications  ?Medication Sig Dispense Refill  ? Cholecalciferol (VITAMIN D) 50 MCG (2000 UT) tablet Take 2,000 Units by mouth daily.    ? Magnesium 500 MG TABS Take by mouth.    ? vitamin B-12 (CYANOCOBALAMIN) 500 MCG tablet Take 500 mcg by mouth daily.    ? vitamin k 100 MCG tablet Take 100 mcg by mouth daily.    ? ?Current Facility-Administered Medications  ?Medication Dose Route Frequency Provider Last Rate Last Admin  ? 0.9 %  sodium chloride infusion  500 mL Intravenous Once Doran Stabler, MD      ? ? ? ? ?___________________________________________________________________ ?Objective  ? ?Exam: ? ?BP (!) 185/103   Pulse 89   Temp 97.7 ?F (36.5 ?C)   Resp (!) 22   Ht '5\' 6"'$  (1.676 m)   Wt 197 lb (89.4 kg)   SpO2 99%   BMI 31.80 kg/m?  ? ?CV: RRR without murmur, S1/S2 ?Resp: clear to auscultation bilaterally, normal RR and effort noted ?GI: soft, no tenderness, with active bowel sounds. ? ? ?Assessment: ?Encounter Diagnosis  ?Name Primary?  ? LLQ abdominal pain Yes  ? ? ? ?Plan: ?Colonoscopy ? The benefits and risks of the planned procedure  were described in detail with the patient or (when appropriate) their health care proxy.  Risks were outlined as including, but not limited to, bleeding, infection, perforation, adverse medication reaction leading to cardiac or pulmonary decompensation, pancreatitis (if ERCP).  The limitation of incomplete mucosal visualization was also discussed.  No guarantees or warranties were given. ? ? ? ?The patient is appropriate for an endoscopic procedure in the ambulatory setting. ? ? - Wilfrid Lund, MD ? ? ? ? ?

## 2022-03-08 NOTE — Progress Notes (Signed)
Pt's states no medical or surgical changes since previsit or office visit. 

## 2022-03-08 NOTE — Progress Notes (Signed)
Report given to PACU, vss 

## 2022-03-08 NOTE — Patient Instructions (Signed)
Please read handouts provided. ?Continue present medications. ?Await pathology results. ?One tablespoon of citrucel fiber in a glass of water or juice once daily. ? ? ?YOU HAD AN ENDOSCOPIC PROCEDURE TODAY AT [facility]:   Refer to the procedure report that was given to you for any specific questions about what was found during the examination.  If the procedure report does not answer your questions, please call your gastroenterologist to clarify.  If you requested that your care partner not be given the details of your procedure findings, then the procedure report has been included in a sealed envelope for you to review at your convenience later. ? ?YOU SHOULD EXPECT: Some feelings of bloating in the abdomen. Passage of more gas than usual.  Walking can help get rid of the air that was put into your GI tract during the procedure and reduce the bloating. If you had a lower endoscopy (such as a colonoscopy or flexible sigmoidoscopy) you may notice spotting of blood in your stool or on the toilet paper. If you underwent a bowel prep for your procedure, you may not have a normal bowel movement for a few days. ? ?Please Note:  You might notice some irritation and congestion in your nose or some drainage.  This is from the oxygen used during your procedure.  There is no need for concern and it should clear up in a day or so. ? ?SYMPTOMS TO REPORT IMMEDIATELY: ? ?Following lower endoscopy (colonoscopy or flexible sigmoidoscopy): ? Excessive amounts of blood in the stool ? Significant tenderness or worsening of abdominal pains ? Swelling of the abdomen that is new, acute ? Fever of 100?F or higher ? ? ?For urgent or emergent issues, a gastroenterologist can be reached at any hour by calling [phone]. ?Do not use MyChart messaging for urgent concerns.  ? ? ?DIET:  We do recommend a small meal at first, but then you may proceed to your regular diet.  Drink plenty of fluids but you should avoid  alcoholic beverages for 24 hours. ? ?ACTIVITY:  You should plan to take it easy for the rest of today and you should NOT DRIVE or use heavy machinery until tomorrow (because of the sedation medicines used during the test).   ? ?FOLLOW UP: ?Our staff will call the number listed on your records 48-72 hours following your procedure to check on you and address any questions or concerns that you may have regarding the information given to you following your procedure. If we do not reach you, we will leave a message.  We will attempt to reach you two times.  During this call, we will ask if you have developed any symptoms of COVID 19. If you develop any symptoms (ie: fever, flu-like symptoms, shortness of breath, cough etc.) before then, please call [phone].  If you test positive for Covid 19 in the 2 weeks post procedure, please call and report this information to [place].   ? ?If any biopsies were taken you will be contacted by phone or by letter within the next 1-3 weeks.  Please call us at [phone] if you have not heard about the biopsies in 3 weeks.  ? ? ?SIGNATURES/CONFIDENTIALITY: ?You and/or your care partner have signed paperwork which will be entered into your electronic medical record.  These signatures attest to the fact that that the information above on your After Visit Summary has been reviewed and is understood.  Full responsibility of the confidentiality of this discharge  information lies with you and/or your care-partner.  ?

## 2022-03-08 NOTE — Op Note (Signed)
[facility] ?Patient Name: Kimberly Hall ?Procedure Date: 03/08/2022 2:35 PM ?MRN: [id number] ?Endoscopist: Estill Cotta. Loletha Carrow , MD ?Age: 61 ?Referring MD:  ?Date of Birth: 1961-04-22 ?Gender: Female ?Account #: [id number] ?Procedure:                Colonoscopy ?Indications:              Abdominal pain in the left lower quadrant  ?                          (intermittent, years, recent office note with  ?                          clinical details) ?                          altered bowel habits (thin, feelings of incomplete  ?                          evacuation) ?                          episode of "red stool", patient was uncertain if  ?                          blood or foodstuffs ?Medicines:                Monitored Anesthesia Care ?Procedure:                Pre-Anesthesia Assessment: ?                          - Prior to the procedure, a History and Physical  ?                          was performed, and patient medications and  ?                          allergies were reviewed. The patient's tolerance of  ?                          previous anesthesia was also reviewed. The risks  ?                          and benefits of the procedure and the sedation  ?                          options and risks were discussed with the patient.  ?                          All questions were answered, and informed consent  ?                          was obtained. Prior Anticoagulants: The patient has  ?                          taken no previous  anticoagulant or antiplatelet  ?                          agents. ASA Grade Assessment: II - A patient with  ?                          mild systemic disease. After reviewing the risks  ?                          and benefits, the patient was deemed in  ?                          satisfactory condition to undergo the procedure. ?                          After obtaining informed consent, the colonoscope  ?                          was passed under direct vision. Throughout  the  ?                          procedure, the patient's blood pressure, pulse, and  ?                          oxygen saturations were monitored continuously. The  ?                          Olympus CF-[id number] (#[id number]) Colonoscope was  ?                          introduced through the anus and advanced to the the  ?                          terminal ileum, with identification of the  ?                          appendiceal orifice and IC valve. The colonoscopy  ?                          was somewhat difficult due to a redundant colon.  ?                          Successful completion of the procedure was aided by  ?                          using manual pressure and straightening and  ?                          shortening the scope to obtain bowel loop  ?                          reduction. The patient tolerated the procedure  ?  well. The quality of the bowel preparation was  ?                          excellent. The terminal ileum, ileocecal valve,  ?                          appendiceal orifice, and rectum were photographed. ?Scope In: 2:49:06 PM ?Scope Out: 3:09:22 PM ?Scope Withdrawal Time: 0 hours 14 minutes 16 seconds  ?Total Procedure Duration: 0 hours 20 minutes 16 seconds  ?Findings:                 The perianal and digital rectal examinations were  ?                          normal. ?                          The terminal ileum appeared normal. ?                          A few small-mouthed diverticula were found in the  ?                          left colon and right colon. ?                          A diminutive polyp was found in the cecum. The  ?                          polyp was semi-sessile. The polyp was removed with  ?                          a cold biopsy forceps. Resection and retrieval were  ?                          complete. ?                          A diminutive polyp was found in the distal  ?                          ascending colon. The polyp was sessile. The  polyp  ?                          was removed with a cold snare. Resection and  ?                          retrieval were complete. ?                          The exam was otherwise without abnormality on  ?                          direct and retroflexion views. ?Complications:            No immediate complications. ?Estimated Blood  Loss:     Estimated blood loss was minimal. ?Impression:               - The examined portion of the ileum was normal. ?                          - Diverticulosis in the left colon and in the right  ?                          colon. ?                          - One diminutive polyp in the cecum, removed with a  ?                          cold biopsy forceps. Resected and retrieved. ?                          - One diminutive polyp in the distal ascending  ?                          colon, removed with a cold snare. Resected and  ?                          retrieved. ?                          - The examination was otherwise normal on direct  ?                          and retroflexion views. ?                          IBS-like symptoms ?Recommendation:           - Patient has a contact number available for  ?                          emergencies. The signs and symptoms of potential  ?                          delayed complications were discussed with the  ?                          patient. Return to normal activities tomorrow.  ?                          Written discharge instructions were provided to the  ?                          patient. ?                          - Resume previous diet. ?                          - Continue present medications. ?                          -  Await pathology results. ?                          - Repeat colonoscopy is recommended for  ?                          surveillance. The colonoscopy date will be  ?                          determined after pathology results from today's  ?                          exam become available for review. ?                           - One tablespoon of citrucel fiber in a glass of  ?                          water or juice once daily. ?[name] L. Loletha Carrow, MD ?03/08/2022 3:18:59 PM ?This report has been signed electronically. ?

## 2022-03-10 ENCOUNTER — Telehealth: Payer: Self-pay | Admitting: *Deleted

## 2022-03-10 NOTE — Telephone Encounter (Signed)
Attempted to call patient for their post-procedure follow-up call. No answer. Left voicemail.   

## 2022-03-10 NOTE — Telephone Encounter (Signed)
?  Follow up Call- ? ? ?  03/08/2022  ?  2:28 PM  ?Call back number  ?Post procedure Call Back phone  # [phone]  ?Permission to leave phone message Yes  ?  ? ?Patient questions: ? ?Do you have a fever, pain , or abdominal swelling? No. ?Pain Score  0 * ? ?Have you tolerated food without any problems? Yes.   ? ?Have you been able to return to your normal activities? Yes.   ? ?Do you have any questions about your discharge instructions: ?Diet   No. ?Medications  No. ?Follow up visit  No. ? ?Do you have questions or concerns about your Care? No. ? ?Actions: ?* If pain score is 4 or above: ?No action needed, pain <4. ? ? ?

## 2022-03-11 ENCOUNTER — Encounter: Payer: Self-pay | Admitting: Gastroenterology

## 2022-04-16 IMAGING — MG MM DIGITAL SCREENING BILAT W/ TOMO AND CAD
6 of 10 series · 6 of 30 positions shown · non-contrast
Comparison: Previous exam(s).

CLINICAL DATA: Screening.

EXAM:
DIGITAL SCREENING BILATERAL MAMMOGRAM WITH TOMOSYNTHESIS AND CAD
TECHNIQUE: Bilateral screening digital craniocaudal and mediolateral oblique
mammograms were obtained. Bilateral screening digital breast
tomosynthesis was performed. The images were evaluated with
computer-aided detection.

[L CC synth-2D]
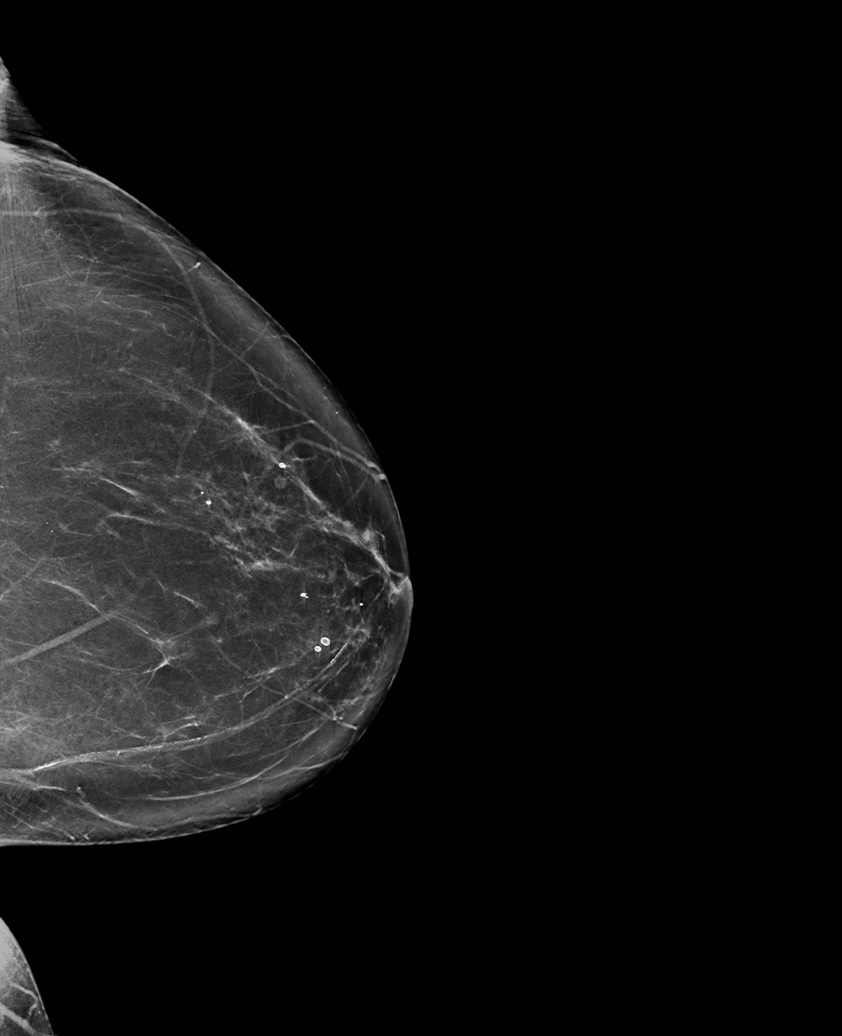

[L MLO synth-2D (1 of 2)]
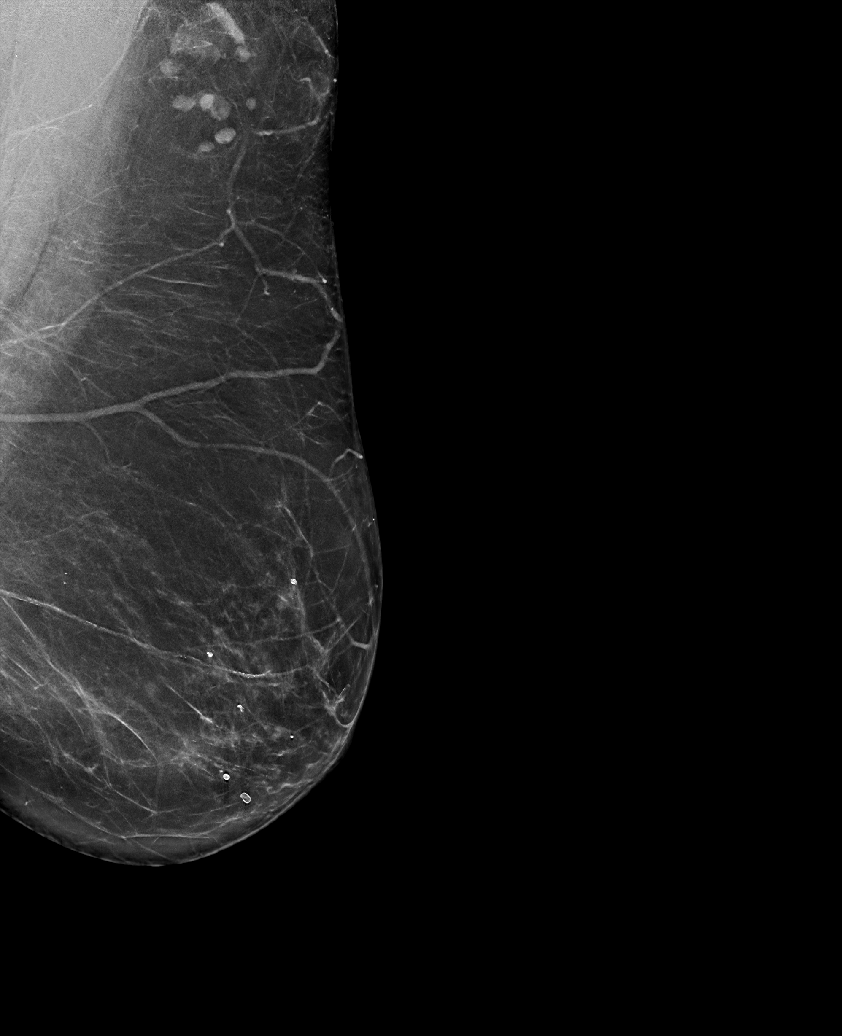

[R MLO synth-2D]
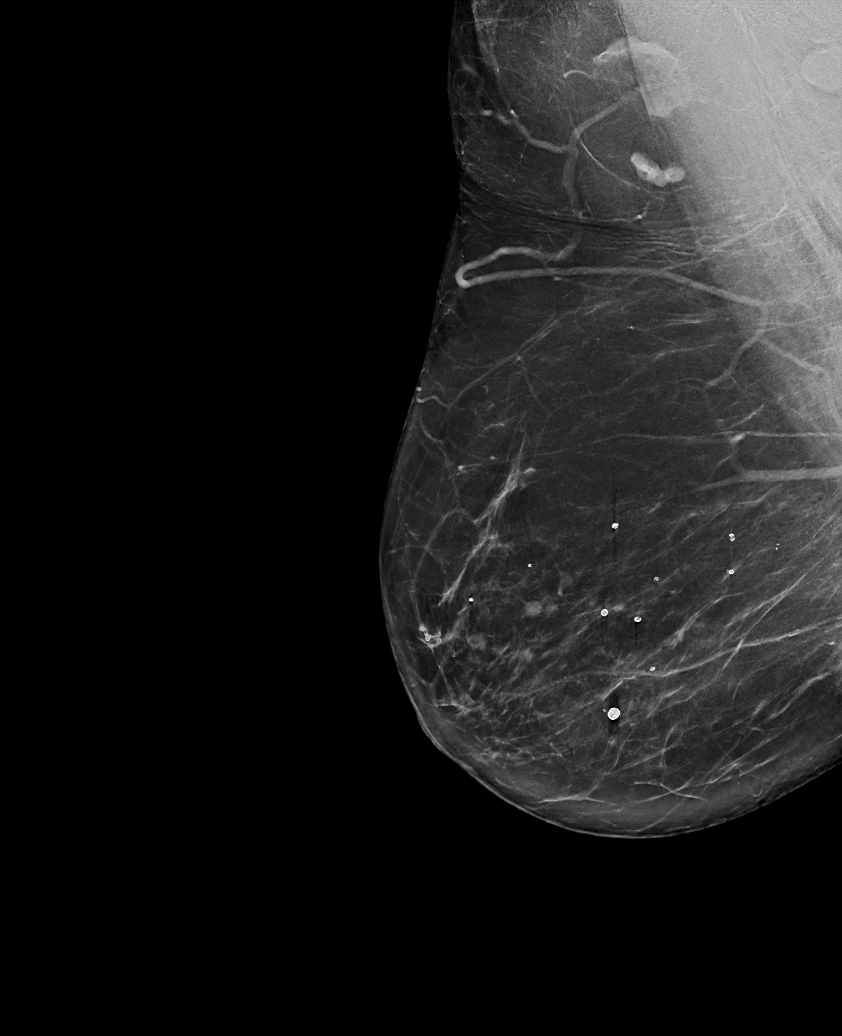

[L MLO synth-2D (2 of 2)]
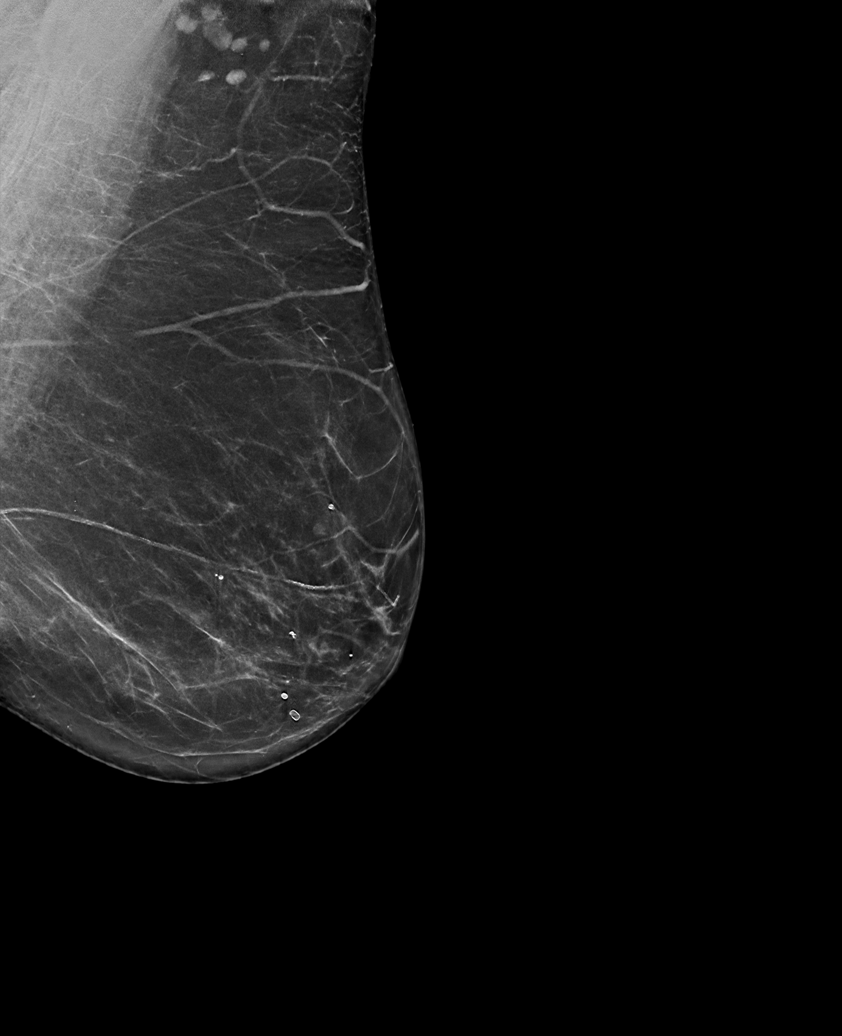

[R CC synth-2D]
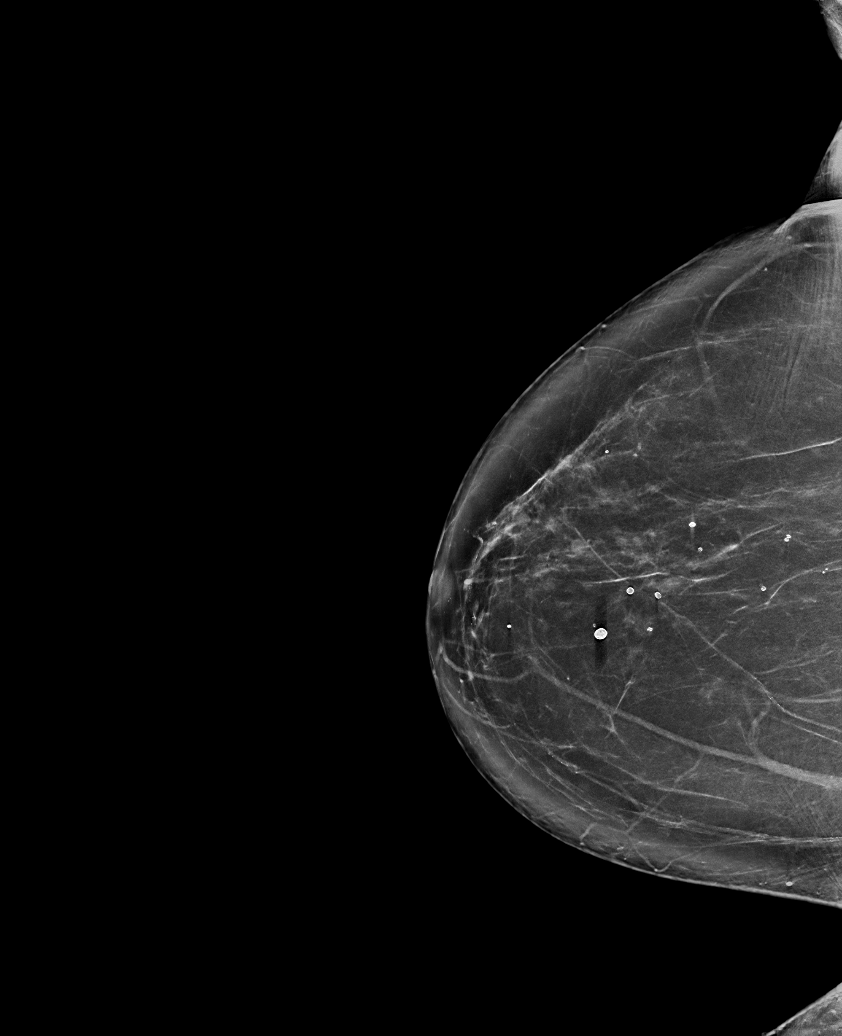

[L MLO tomo · tomo slice 38/75.0]
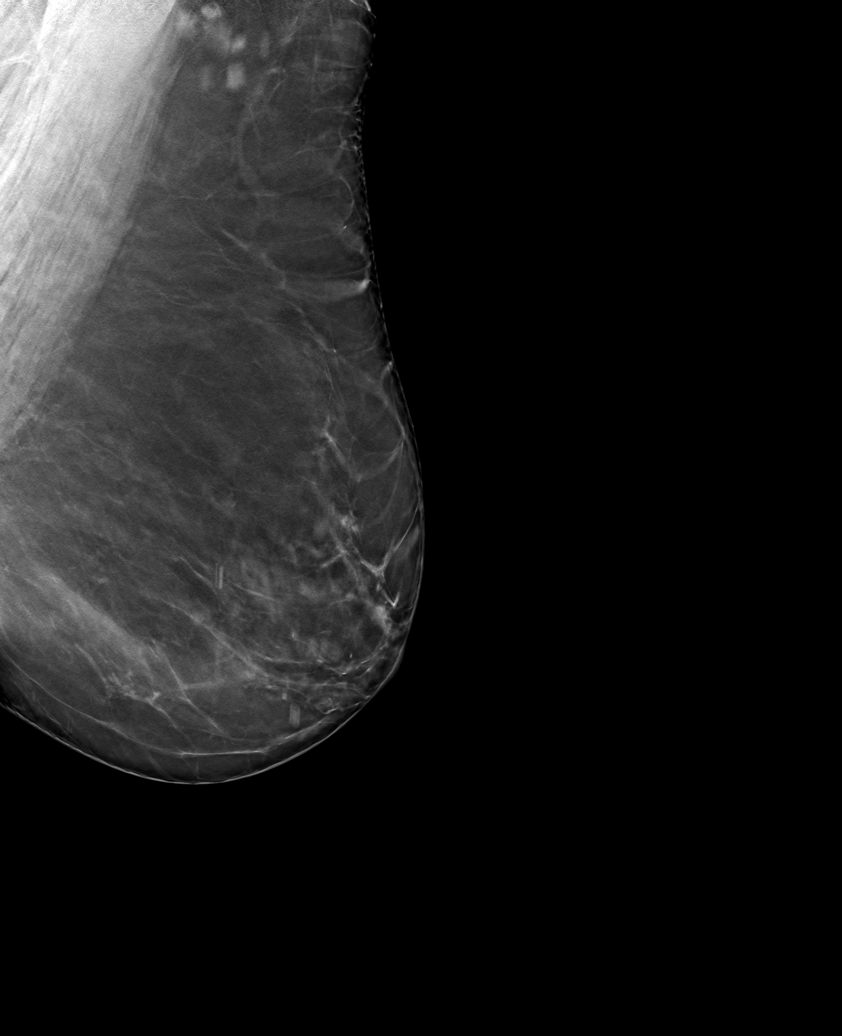

[6 of 30 positions shown; findings below may reference images not displayed]

ACR Breast Density Category b: There are scattered areas of
fibroglandular density.
FINDINGS: There are no findings suspicious for malignancy.
IMPRESSION: No mammographic evidence of malignancy. A result letter of this
screening mammogram will be mailed directly to the patient.

RECOMMENDATION:
Screening mammogram in one year. (Code:51-O-LD2)

BI-RADS CATEGORY  1: Negative.

## 2022-04-29 ENCOUNTER — Ambulatory Visit: Payer: Self-pay | Admitting: *Deleted

## 2022-04-29 VITALS — BP 141/101 | Ht 64.0 in | Wt 194.0 lb

## 2022-04-29 DIAGNOSIS — Z0000 Encounter for general adult medical examination without abnormal findings: Secondary | ICD-10-CM

## 2022-04-29 DIAGNOSIS — R7303 Prediabetes: Secondary | ICD-10-CM

## 2022-04-29 DIAGNOSIS — E559 Vitamin D deficiency, unspecified: Secondary | ICD-10-CM

## 2022-04-29 NOTE — Progress Notes (Signed)
[employer] insurance premium discount evaluation: Labs Drawn. Replacements ROI form signed. Tobacco Free Attestation form signed.  Forms placed in paper chart.  

## 2022-04-30 LAB — CMP12+LP+TP+TSH+6AC+CBC/D/PLT
ALT: 14 IU/L (ref 0–32)
AST: 19 IU/L (ref 0–40)
Albumin/Globulin Ratio: 1.7 (ref 1.2–2.2)
Albumin: 4.1 g/dL (ref 3.8–4.8)
Alkaline Phosphatase: 77 IU/L (ref 44–121)
BUN/Creatinine Ratio: 22 (ref 12–28)
BUN: 14 mg/dL (ref 8–27)
Basophils Absolute: 0 10*3/uL (ref 0.0–0.2)
Basos: 1 %
Bilirubin Total: 0.5 mg/dL (ref 0.0–1.2)
Calcium: 8.7 mg/dL (ref 8.7–10.3)
Chloride: 102 mmol/L (ref 96–106)
Chol/HDL Ratio: 3.4 ratio (ref 0.0–4.4)
Cholesterol, Total: 231 mg/dL — ABNORMAL HIGH (ref 100–199)
Creatinine, Ser: 0.64 mg/dL (ref 0.57–1.00)
EOS (ABSOLUTE): 0.1 10*3/uL (ref 0.0–0.4)
Eos: 1 %
Estimated CHD Risk: 0.5 times avg. (ref 0.0–1.0)
Free Thyroxine Index: 1.8 (ref 1.2–4.9)
GGT: 15 IU/L (ref 0–60)
Globulin, Total: 2.4 g/dL (ref 1.5–4.5)
Glucose: 101 mg/dL — ABNORMAL HIGH (ref 70–99)
HDL: 67 mg/dL (ref >39)
Hematocrit: 36 % (ref 34.0–46.6)
Hemoglobin: 12.2 g/dL (ref 11.1–15.9)
Immature Grans (Abs): 0 10*3/uL (ref 0.0–0.1)
Immature Granulocytes: 0 %
Iron: 92 ug/dL (ref 27–139)
LDH: 205 IU/L (ref 119–226)
LDL Chol Calc (NIH): 146 mg/dL — ABNORMAL HIGH (ref 0–99)
Lymphocytes Absolute: 1.6 10*3/uL (ref 0.7–3.1)
Lymphs: 29 %
MCH: 31 pg (ref 26.6–33.0)
MCHC: 33.9 g/dL (ref 31.5–35.7)
MCV: 92 fL (ref 79–97)
Monocytes Absolute: 0.4 10*3/uL (ref 0.1–0.9)
Monocytes: 6 %
Neutrophils Absolute: 3.5 10*3/uL (ref 1.4–7.0)
Neutrophils: 63 %
Phosphorus: 2.9 mg/dL — ABNORMAL LOW (ref 3.0–4.3)
Platelets: 218 10*3/uL (ref 150–450)
Potassium: 4 mmol/L (ref 3.5–5.2)
RBC: 3.93 x10E6/uL (ref 3.77–5.28)
RDW: 13 % (ref 11.7–15.4)
Sodium: 139 mmol/L (ref 134–144)
T3 Uptake Ratio: 24 % (ref 24–39)
T4, Total: 7.3 ug/dL (ref 4.5–12.0)
TSH: 2.65 u[IU]/mL (ref 0.450–4.500)
Total Protein: 6.5 g/dL (ref 6.0–8.5)
Triglycerides: 101 mg/dL (ref 0–149)
Uric Acid: 3.5 mg/dL (ref 3.0–7.2)
VLDL Cholesterol Cal: 18 mg/dL (ref 5–40)
WBC: 5.6 10*3/uL (ref 3.4–10.8)
eGFR: 100 mL/min/{1.73_m2} (ref >59)

## 2022-04-30 LAB — MAGNESIUM: Magnesium: 1.9 mg/dL (ref 1.6–2.3)

## 2022-04-30 LAB — VITAMIN D 25 HYDROXY (VIT D DEFICIENCY, FRACTURES): Vit D, 25-Hydroxy: 25.4 ng/mL — ABNORMAL LOW (ref 30.0–100.0)

## 2022-04-30 LAB — HGB A1C W/O EAG: Hgb A1c MFr Bld: 5.5 % (ref 4.8–5.6)

## 2022-04-30 MED ORDER — VITAMIN D 50 MCG (2000 UT) PO TABS: 2000.0000 [IU] | ORAL_TABLET | Freq: Every day | ORAL | Status: DC

## 2022-04-30 NOTE — Addendum Note (Signed)
Addended by: Gerarda Fraction A on: 04/30/2022 02:29 PM   Modules accepted: Orders

## 2022-05-16 NOTE — Progress Notes (Signed)
Noted reviewed RN Hildred Alamin note agree with vitamin D will follow up with patient tomorrow regarding blood pressure

## 2022-05-16 NOTE — Progress Notes (Signed)
Reviewed results with pt in clinic. Is taking 1000 units Vitamin D. Will double this to 2000 units. Diet and exercise recommendations discussed re: cholesterol, wt maintenance, HTN. BP recheck today 145/96, p82. Denies any HTN sx. Routine f/u with pcp. Copy of labs provided to pt. No further questions/concerns.

## 2022-05-26 ENCOUNTER — Ambulatory Visit: Payer: Self-pay | Admitting: *Deleted

## 2022-05-26 VITALS — BP 128/85 | HR 82

## 2022-05-26 DIAGNOSIS — Z0000 Encounter for general adult medical examination without abnormal findings: Secondary | ICD-10-CM

## 2022-06-28 ENCOUNTER — Ambulatory Visit: Payer: No Typology Code available for payment source | Admitting: Registered Nurse

## 2022-06-28 ENCOUNTER — Encounter: Payer: Self-pay | Admitting: Registered Nurse

## 2022-06-28 VITALS — BP 127/93 | HR 89 | Temp 99.5°F | Resp 16

## 2022-06-28 DIAGNOSIS — R519 Headache, unspecified: Secondary | ICD-10-CM

## 2022-06-28 DIAGNOSIS — R030 Elevated blood-pressure reading, without diagnosis of hypertension: Secondary | ICD-10-CM

## 2022-06-28 MED ORDER — SALINE SPRAY 0.65 % NA SOLN: 2.0000 | NASAL | 0 refills | Status: DC

## 2022-06-28 MED ORDER — ACETAMINOPHEN 500 MG PO TABS: 1000.0000 mg | ORAL_TABLET | Freq: Four times a day (QID) | ORAL | 0 refills | Status: AC | PRN

## 2022-06-28 MED ORDER — LORATADINE 10 MG PO TABS: 10.0000 mg | ORAL_TABLET | Freq: Every day | ORAL | 0 refills | Status: DC

## 2022-06-28 NOTE — Patient Instructions (Addendum)
DASH Eating Plan DASH stands for Dietary Approaches to Stop Hypertension. The DASH eating plan is a healthy eating plan that has been shown to: Reduce high blood pressure (hypertension). Reduce your risk for type 2 diabetes, heart disease, and stroke. Help with weight loss. What are tips for following this plan? Reading food labels Check food labels for the amount of salt (sodium) per serving. Choose foods with less than 5 percent of the Daily Value of sodium. Generally, foods with less than 300 milligrams (mg) of sodium per serving fit into this eating plan. To find whole grains, look for the word "whole" as the first word in the ingredient list. Shopping Buy products labeled as "low-sodium" or "no salt added." Buy fresh foods. Avoid canned foods and pre-made or frozen meals. Cooking Avoid adding salt when cooking. Use salt-free seasonings or herbs instead of table salt or sea salt. Check with your health care provider or pharmacist before using salt substitutes. Do not fry foods. Cook foods using healthy methods such as baking, boiling, grilling, roasting, and broiling instead. Cook with heart-healthy oils, such as olive, canola, avocado, soybean, or sunflower oil. Meal planning  Eat a balanced diet that includes: 4 or more servings of fruits and 4 or more servings of vegetables each day. Try to fill one-half of your plate with fruits and vegetables. 6-8 servings of whole grains each day. Less than 6 oz (170 g) of lean meat, poultry, or fish each day. A 3-oz (85-g) serving of meat is about the same size as a deck of cards. One egg equals 1 oz (28 g). 2-3 servings of low-fat dairy each day. One serving is 1 cup (237 mL). 1 serving of nuts, seeds, or beans 5 times each week. 2-3 servings of heart-healthy fats. Healthy fats called omega-3 fatty acids are found in foods such as walnuts, flaxseeds, fortified milks, and eggs. These fats are also found in cold-water fish, such as sardines, salmon,  and mackerel. Limit how much you eat of: Canned or prepackaged foods. Food that is high in trans fat, such as some fried foods. Food that is high in saturated fat, such as fatty meat. Desserts and other sweets, sugary drinks, and other foods with added sugar. Full-fat dairy products. Do not salt foods before eating. Do not eat more than 4 egg yolks a week. Try to eat at least 2 vegetarian meals a week. Eat more home-cooked food and less restaurant, buffet, and fast food. Lifestyle When eating at a restaurant, ask that your food be prepared with less salt or no salt, if possible. If you drink alcohol: Limit how much you use to: 0-1 drink a day for women who are not pregnant. 0-2 drinks a day for men. Be aware of how much alcohol is in your drink. In the [place] one drink equals one 12 oz bottle of beer (355 mL), one 5 oz glass of wine (148 mL), or one 1 oz glass of hard liquor (44 mL). General information Avoid eating more than 2,300 mg of salt a day. If you have hypertension, you may need to reduce your sodium intake to 1,500 mg a day. Work with your health care provider to maintain a healthy body weight or to lose weight. Ask what an ideal weight is for you. Get at least 30 minutes of exercise that causes your heart to beat faster (aerobic exercise) most days of the week. Activities may include walking, swimming, or biking. Work with your health care provider or dietitian to   adjust your eating plan to your individual calorie needs. What foods should I eat? Fruits All fresh, dried, or frozen fruit. Canned fruit in natural juice (without added sugar). Vegetables Fresh or frozen vegetables (raw, steamed, roasted, or grilled). Low-sodium or reduced-sodium tomato and vegetable juice. Low-sodium or reduced-sodium tomato sauce and tomato paste. Low-sodium or reduced-sodium canned vegetables. Grains Whole-grain or whole-wheat bread. Whole-grain or whole-wheat pasta. Brown rice. Oatmeal. Quinoa.  Bulgur. Whole-grain and low-sodium cereals. Pita bread. Low-fat, low-sodium crackers. Whole-wheat flour tortillas. Meats and other proteins Skinless chicken or turkey. Ground chicken or turkey. Pork with fat trimmed off. Fish and seafood. Egg whites. Dried beans, peas, or lentils. Unsalted nuts, nut butters, and seeds. Unsalted canned beans. Lean cuts of beef with fat trimmed off. Low-sodium, lean precooked or cured meat, such as sausages or meat loaves. Dairy Low-fat (1%) or fat-free (skim) milk. Reduced-fat, low-fat, or fat-free cheeses. Nonfat, low-sodium ricotta or cottage cheese. Low-fat or nonfat yogurt. Low-fat, low-sodium cheese. Fats and oils Soft margarine without trans fats. Vegetable oil. Reduced-fat, low-fat, or light mayonnaise and salad dressings (reduced-sodium). Canola, safflower, olive, avocado, soybean, and sunflower oils. Avocado. Seasonings and condiments Herbs. Spices. Seasoning mixes without salt. Other foods Unsalted popcorn and pretzels. Fat-free sweets. The items listed above may not be a complete list of foods and beverages you can eat. Contact a dietitian for more information. What foods should I avoid? Fruits Canned fruit in a light or heavy syrup. Fried fruit. Fruit in cream or butter sauce. Vegetables Creamed or fried vegetables. Vegetables in a cheese sauce. Regular canned vegetables (not low-sodium or reduced-sodium). Regular canned tomato sauce and paste (not low-sodium or reduced-sodium). Regular tomato and vegetable juice (not low-sodium or reduced-sodium). Pickles. Olives. Grains Baked goods made with fat, such as croissants, muffins, or some breads. Dry pasta or rice meal packs. Meats and other proteins Fatty cuts of meat. Ribs. Fried meat. Bacon. Bologna, salami, and other precooked or cured meats, such as sausages or meat loaves. Fat from the back of a pig (fatback). Bratwurst. Salted nuts and seeds. Canned beans with added salt. Canned or smoked fish.  Whole eggs or egg yolks. Chicken or turkey with skin. Dairy Whole or 2% milk, cream, and half-and-half. Whole or full-fat cream cheese. Whole-fat or sweetened yogurt. Full-fat cheese. Nondairy creamers. Whipped toppings. Processed cheese and cheese spreads. Fats and oils Butter. Stick margarine. Lard. Shortening. Ghee. Bacon fat. Tropical oils, such as coconut, palm kernel, or palm oil. Seasonings and condiments Onion salt, garlic salt, seasoned salt, table salt, and sea salt. Worcestershire sauce. Tartar sauce. Barbecue sauce. Teriyaki sauce. Soy sauce, including reduced-sodium. Steak sauce. Canned and packaged gravies. Fish sauce. Oyster sauce. Cocktail sauce. Store-bought horseradish. Ketchup. Mustard. Meat flavorings and tenderizers. Bouillon cubes. Hot sauces. Pre-made or packaged marinades. Pre-made or packaged taco seasonings. Relishes. Regular salad dressings. Other foods Salted popcorn and pretzels. The items listed above may not be a complete list of foods and beverages you should avoid. Contact a dietitian for more information. Where to find more information National Heart, Lung, and Blood Institute: www.nhlbi.nih.gov American Heart Association: www.heart.org Academy of Nutrition and Dietetics: www.eatright.org National Kidney Foundation: www.kidney.org Summary The DASH eating plan is a healthy eating plan that has been shown to reduce high blood pressure (hypertension). It may also reduce your risk for type 2 diabetes, heart disease, and stroke. When on the DASH eating plan, aim to eat more fresh fruits and vegetables, whole grains, lean proteins, low-fat dairy, and heart-healthy fats. With the DASH   eating plan, you should limit salt (sodium) intake to 2,300 mg a day. If you have hypertension, you may need to reduce your sodium intake to 1,500 mg a day. Work with your health care provider or dietitian to adjust your eating plan to your individual calorie needs. This information is not  intended to replace advice given to you by your health care provider. Make sure you discuss any questions you have with your health care provider. Document Revised: 10/18/2019 Document Reviewed: 10/18/2019 Elsevier Patient Education  [place]. Preventing Hypertension Hypertension, also called high blood pressure, is when the force of blood pumping through the arteries is too strong. Arteries are blood vessels that carry blood from the heart throughout the body. Often, hypertension does not cause symptoms until blood pressure is very high. It is important to have your blood pressure checked regularly. Diet and lifestyle changes can help you prevent hypertension, and they may make you feel better overall and improve your quality of life. If you already have hypertension, you may control it with diet and lifestyle changes, as well as with medicine. How can this condition affect me? Over time, hypertension can damage the arteries and decrease blood flow to important parts of the body, including the brain, heart, and kidneys. By keeping your blood pressure in a healthy range, you can help prevent complications like heart attack, heart failure, stroke, kidney failure, and vascular dementia. What can increase my risk? An unhealthy diet and a lack of physical activity can make you more likely to develop high blood pressure. Some other risk factors include: Age. The risk increases with age. Having family members who have had high blood pressure. Having certain health conditions, such as thyroid problems. Being overweight or obese. Drinking too much alcohol or caffeine. Having too much fat, sugar, calories, or salt (sodium) in your diet. Smoking or using illegal drugs. Taking certain medicines, such as antidepressants, decongestants, birth control pills, and NSAIDs, such as ibuprofen. What actions can I take to prevent or manage this condition? Work with your health care provider to make a  hypertension prevention plan that works for you. You may be referred for counseling on a healthy diet and physical activity. Follow your plan and keep all follow-up visits. Diet changes Maintain a healthy diet. This includes: Eating less salt (sodium). Ask your health care provider how much sodium is safe for you to have. The general recommendation is to have less than 1 tsp (2,300 mg) of sodium a day. Do not add salt to your food. Choose low-sodium options when grocery shopping and eating out. Limiting fats in your diet. You can do this by eating low-fat or fat-free dairy products and by eating less red meat. Eating more fruits, vegetables, and whole grains. Make a goal to eat: 1-2 cups of fresh fruits and vegetables each day. 3-4 servings of whole grains each day. Avoiding foods and beverages that have added sugars. Eating fish that contain healthy fats (omega-3 fatty acids), such as mackerel or salmon. If you need help putting together a healthy eating plan, try the DASH diet. This diet is high in fruits, vegetables, and whole grains. It is low in sodium, red meat, and added sugars. DASH stands for Dietary Approaches to Stop Hypertension. Lifestyle changes  Lose weight if you are overweight. Losing just 3-5% of your body weight can help prevent or control hypertension. For example, if your present weight is 200 lb (91 kg), a loss of 3-5% of your weight means losing  6-10 lb (2.7-4.5 kg). Ask your health care provider to help you with a diet and exercise plan to safely lose weight. Get enough exercise. Do at least 150 minutes of moderate-intensity exercise each week. You could do this in short exercise sessions several times a day, or you could do longer exercise sessions a few times a week. For example, you could take a brisk 10-minute walk or bike ride, 3 times a day, for 5 days a week. Find ways to reduce stress, such as exercising, meditating, listening to music, or taking a yoga class. If you  need help reducing stress, ask your health care provider. Do not use any products that contain nicotine or tobacco. These products include cigarettes, chewing tobacco, and vaping devices, such as e-cigarettes. Chemicals in tobacco and nicotine products raise your blood pressure each time you use them. If you need help quitting, ask your health care provider. Learn how to check your blood pressure at home. Make sure that you know your personal target blood pressure, as told by your health care provider. Try to sleep 7-9 hours per night. Alcohol use Do not drink alcohol if: Your health care provider tells you not to drink. You are pregnant, may be pregnant, or are planning to become pregnant. If you drink alcohol: Limit how much you have to: 0-1 drink a day for women. 0-2 drinks a day for men. Know how much alcohol is in your drink. In the [place] one drink equals one 12 oz bottle of beer (355 mL), one 5 oz glass of wine (148 mL), or one 1 oz glass of hard liquor (44 mL). Medicines In addition to diet and lifestyle changes, your health care provider may recommend medicines to help lower your blood pressure. In general: You may need to try a few different medicines to find what works best for you. You may need to take more than one medicine. Take over-the-counter and prescription medicines only as told by your health care provider. Questions to ask your health care provider What is my blood pressure goal? How can I lower my risk for high blood pressure? How should I monitor my blood pressure at home? Where to find support Your health care provider can help you prevent hypertension and help you keep your blood pressure at a healthy level. Your local hospital or your community may also provide support services and prevention programs. The American Heart Association offers an online support network at supportnetwork.heart.org Where to find more information Learn more about hypertension  from: [place], Lung, and [place]: https://wilson-eaton.com/ Centers for Disease Control and Prevention: http://www.wolf.info/ American Academy of Family Physicians: familydoctor.org Learn more about the DASH diet from: [place], Lung, and [place]: https://wilson-eaton.com/ Contact a health care provider if: You think you are having a reaction to medicines you have taken. You have recurrent headaches or feel dizzy. You have swelling in your ankles. You have trouble with your vision. Get help right away if: You have sudden, severe chest, back, or abdominal pain or discomfort. You have shortness of breath. You have a sudden, severe headache. These symptoms may be an emergency. Get help right away. Call 911. Do not wait to see if the symptoms will go away. Do not drive yourself to the hospital. Summary Hypertension often does not cause any symptoms until blood pressure is very high. It is important to get your blood pressure checked regularly. Diet and lifestyle changes are important steps in preventing hypertension. By keeping your blood pressure in  a healthy range, you may prevent complications like heart attack, heart failure, stroke, and kidney failure. Work with your health care provider to make a hypertension prevention plan that works for you. This information is not intended to replace advice given to you by your health care provider. Make sure you discuss any questions you have with your health care provider. Document Revised: 09/02/2021 Document Reviewed: 09/02/2021 Elsevier Patient Education  [place]. Sinus Pain  Sinus pain may occur when your sinuses become clogged or swollen. Sinuses are air-filled spaces in your skull that are behind the bones of your face and forehead. Sinus pain can range from mild to severe. What are the causes? Sinus pain can result from various conditions that affect the sinuses. Common causes include: Colds. Sinus  infections. Allergies. What are the signs or symptoms? The main symptom of this condition is pain or pressure in your face, forehead, ears, or upper teeth. People who have sinus pain often have other symptoms, such as: Congested or runny nose. Fever. Inability to smell. Headache. Weather changes can make symptoms worse. How is this diagnosed? Your health care provider will diagnose this condition based on your symptoms and a physical exam. If you have pain that keeps coming back or does not go away, your health care provider may recommend more testing. This may include: Imaging tests, such as a CT scan or MRI, to check for problems with your sinuses. Examination of your sinuses using a thin tool with a camera that is inserted through your nose (endoscopy). How is this treated? Treatment for this condition depends on the cause. Sinus pain that is caused by a sinus infection may be treated with antibiotic medicine. Sinus pain that is caused by congestion may be helped by rinsing out (flushing) the nose and sinuses with saline solution. Sinus pain that is caused by allergies may be helped by allergy medicines (antihistamines) and medicated nasal sprays. Sinus surgery may be needed in some cases if other treatments do not help. Follow these instructions at home: General instructions If directed: Apply a warm, moist washcloth to your face to help relieve pain. Use a nasal saline wash. Follow the directions on the bottle or box. Hydrate and humidify Drink enough water to keep your urine clear or pale yellow. Staying hydrated will help to thin your mucus. Use a humidifier if your home is dry. Inhale steam for 10-15 minutes, 3-4 times a day or as told by your health care provider. You can do this in the bathroom while a hot shower is running. Limit your exposure to cool or dry air. Medicines  Take over-the-counter and prescription medicines only as told by your health care provider. If you  were prescribed an antibiotic medicine, take it as told by your health care provider. Do not stop taking the antibiotic even if you start to feel better. If you have congestion, use a nasal spray to help lessen pressure. Contact a health care provider if: You have sinus pain more than one time a week. You have sensitivity to light or sound. You develop a fever. You feel nauseous or you vomit. Your sinus pain or headache does not get better with treatment. Get help right away if: You have vision problems. You have sudden, severe pain in your face or head. You have a seizure. You are confused. You have a stiff neck. Summary Sinus pain occurs when your sinuses become clogged or swollen. Sinus pain can result from various conditions that affect the sinuses,  such as a cold, a sinus infection, or an allergy. Treatment for this condition depends on the cause. It may include medicine, such as antibiotics or antihistamines. This information is not intended to replace advice given to you by your health care provider. Make sure you discuss any questions you have with your health care provider. Document Revised: 10/17/2021 Document Reviewed: 10/17/2021 Elsevier Patient Education  [place]. Form - Headache Record There are many types and causes of headaches. A headache record can help guide your treatment plan. Use this form to record the details. Bring this form with you to your follow-up visits. Follow your health care provider's instructions on how to describe your headache. You may be asked to: Use a pain scale. This is a tool to rate the intensity of your headache using words or numbers. Describe what your headache feels like, such as dull, achy, throbbing, or sharp. Headache record Date: _______________ Time (from start to end): ____________________ Location of the headache: _________________________ Intensity of the headache: ____________________ Description of the headache:  ______________________________________________________________ Hours of sleep the night before the headache: __________ Food or drinks before the headache started: ______________________________________________________________________________________ Events before the headache started: _______________________________________________________________________________________________ Symptoms before the headache started: __________________________________________________________________________________________ Symptoms during the headache: __________________________________________________________________________________________________ Treatment: ________________________________________________________________________________________________________________ Effect of treatment: _________________________________________________________________________________________________________ Other comments: ___________________________________________________________________________________________________________ Date: _______________ Time (from start to end): ____________________ Location of the headache: _________________________ Intensity of the headache: ____________________ Description of the headache: ______________________________________________________________ Hours of sleep the night before the headache: __________ Food or drinks before the headache started: ______________________________________________________________________________________ Events before the headache started: ____________________________________________________________________________________________ Symptoms before the headache started: _________________________________________________________________________________________ Symptoms during the headache: _______________________________________________________________________________________________ Treatment:  ________________________________________________________________________________________________________________ Effect of treatment: _________________________________________________________________________________________________________ Other comments: ___________________________________________________________________________________________________________ Date: _______________ Time (from start to end): ____________________ Location of the headache: _________________________ Intensity of the headache: ____________________ Description of the headache: ______________________________________________________________ Hours of sleep the night before the headache: __________ Food or drinks before the headache started: ______________________________________________________________________________________ Events before the headache started: ____________________________________________________________________________________________ Symptoms before the headache started: _________________________________________________________________________________________ Symptoms during the headache: _______________________________________________________________________________________________ Treatment: ________________________________________________________________________________________________________________ Effect of treatment: _________________________________________________________________________________________________________ Other comments: ___________________________________________________________________________________________________________ Date: _______________ Time (from start to end): ____________________ Location of the headache: _________________________ Intensity of the headache: ____________________ Description of the headache: ______________________________________________________________ Hours of sleep the night before the headache: _________ Food or drinks before the headache started:  ______________________________________________________________________________________ Events before the headache started: ____________________________________________________________________________________________ Symptoms before the headache started: _________________________________________________________________________________________ Symptoms during the headache: _______________________________________________________________________________________________ Treatment: ________________________________________________________________________________________________________________ Effect of treatment: _________________________________________________________________________________________________________ Other comments: ___________________________________________________________________________________________________________ Date: _______________ Time (from start to end): ____________________ Location of the headache: _________________________ Intensity of the headache: ____________________ Description of the headache: ______________________________________________________________ Hours of sleep the night before the headache: _________ Food or drinks before the headache started: ______________________________________________________________________________________ Events before the headache started: ____________________________________________________________________________________________ Symptoms before the headache started: _________________________________________________________________________________________ Symptoms during the headache: _______________________________________________________________________________________________ Treatment: ________________________________________________________________________________________________________________ Effect of treatment: _________________________________________________________________________________________________________ Other  comments: ___________________________________________________________________________________________________________ This information is not intended to replace advice given to you by your health care provider. Make sure you discuss any questions you have with your health care provider. Document Revised: 04/14/2021 Document Reviewed: 04/14/2021 Elsevier Patient Education  [place]. How to Perform a Sinus Rinse A sinus rinse is a home treatment that is used to rinse your sinuses with a germ-free (sterile) mixture of salt and water (saline solution). Sinuses are air-filled spaces in your skull that are behind the bones of your face and forehead. They open into your nasal cavity. A sinus rinse can help to clear mucus, dirt, dust, or pollen from  your nasal cavity. You may do a sinus rinse when you have a cold, a virus, nasal allergy symptoms, a sinus infection, or stuffiness in your nose or sinuses. What are the risks? A sinus rinse is generally safe and effective. However, there are a few risks, which include: A burning sensation in your sinuses. This may happen if you do not make the saline solution as directed. Be sure to follow all directions when making the saline solution. Nasal irritation. Infection. This may be from unclean supplies or from contaminated water. Infection from contaminated water is rare, but possible. Do not do a sinus rinse if you have had ear or nasal surgery, ear infection, or plugged ears, unless recommended by your health care provider. Supplies needed: Saline solution or powder. Distilled or sterile water to mix with saline powder. You may use boiled and cooled tap water. Boil tap water for 5 minutes; cool until it is lukewarm. Use within 24 hours. Do not use regular tap water to mix with the saline solution. Neti pot or nasal rinse bottle. These supplies release the saline solution into your nose and through your sinuses. Neti pots and nasal rinse bottles can be  purchased at [employer], a health food store, or online. How to perform a sinus rinse  Wash your hands with soap and water for at least 20 seconds. If soap and water are not available, use hand sanitizer. Wash your device according to the directions that came with the product and then dry it. Use the solution that comes with your product or one that is sold separately in stores. Follow the mixing directions on the package to mix with sterile or distilled water. Fill the device with the amount of saline solution noted in the device instructions. Stand by a sink and tilt your head sideways over the sink. Place the spout of the device in your upper nostril (the one closer to the ceiling). Gently pour or squeeze the saline solution into your nasal cavity. The liquid should drain out from the lower nostril if you are not too congested. While rinsing, breathe through your open mouth. Gently blow your nose to clear any mucus and rinse solution. Blowing too hard may cause ear pain. Turn your head in the other direction and repeat in your other nostril. Clean and rinse your device with clean water and then air-dry it. Talk with your health care provider or pharmacist if you have questions about how to do a sinus rinse. Summary A sinus rinse is a home treatment that is used to rinse your sinuses with a sterile mixture of salt and water (saline solution). You may do a sinus rinse when you have a cold, a virus, nasal allergy symptoms, a sinus infection, or stuffiness in your nose or sinuses. A sinus rinse is generally safe and effective. Follow all instructions carefully. This information is not intended to replace advice given to you by your health care provider. Make sure you discuss any questions you have with your health care provider. Document Revised: 05/03/2021 Document Reviewed: 05/03/2021 Elsevier Patient Education  [place].

## 2022-06-28 NOTE — Progress Notes (Signed)
Subjective:    Patient ID: Kimberly Hall, female    DOB: Jan 26, 1961, 61 y.o.   MRN: [id number]  61y/o caucasian female established patient here for headache started yesterday.   A little better today would like blood pressure check.  Has not been taking oral antihistamine/nasal saline/nasal corticosteroid.  Had 8 oz coffee this am.  Denied thunderstorms causing headaches in the past; denied known sick contacts or recent illness/URI symptoms/fever/chills/n/v/d/teeth pain.      Review of Systems  Constitutional:  Negative for activity change, appetite change, chills, diaphoresis, fatigue, fever and unexpected weight change.  HENT:  Positive for postnasal drip and sinus pressure. Negative for congestion, dental problem, drooling, ear discharge, ear pain, facial swelling, hearing loss, mouth sores, nosebleeds, rhinorrhea, sinus pain, sneezing, sore throat, tinnitus, trouble swallowing and voice change.   Eyes:  Negative for photophobia, pain, discharge, redness, itching and visual disturbance.  Respiratory:  Negative for cough, choking, chest tightness, shortness of breath, wheezing and stridor.   Cardiovascular:  Negative for chest pain, palpitations and leg swelling.  Gastrointestinal:  Negative for abdominal distention, abdominal pain, blood in stool, constipation, diarrhea, nausea and vomiting.  Endocrine: Negative for cold intolerance and heat intolerance.  Genitourinary:  Negative for difficulty urinating, dysuria and hematuria.  Musculoskeletal:  Negative for arthralgias, back pain, gait problem, joint swelling, myalgias, neck pain and neck stiffness.  Skin:  Negative for color change, pallor, rash and wound.  Allergic/Immunologic: Positive for environmental allergies. Negative for food allergies.  Neurological:  Positive for headaches. Negative for dizziness, tremors, seizures, syncope, facial asymmetry, speech difficulty, weakness, light-headedness and numbness.  Hematological:   Negative for adenopathy. Does not bruise/bleed easily.  Psychiatric/Behavioral:  Negative for agitation, behavioral problems, confusion and sleep disturbance.        Objective:   Physical Exam Vitals and nursing note reviewed.  Constitutional:      General: She is awake. She is not in acute distress.    Appearance: Normal appearance. She is well-developed, well-groomed and overweight. She is not ill-appearing, toxic-appearing or diaphoretic.  HENT:     Head: Normocephalic and atraumatic.     Jaw: There is normal jaw occlusion.     Salivary Glands: Right salivary gland is not diffusely enlarged or tender. Left salivary gland is not diffusely enlarged or tender.     Right Ear: Hearing and external ear normal.     Left Ear: Hearing and external ear normal.     Nose: Nose normal. No congestion or rhinorrhea.     Mouth/Throat:     Lips: Pink. No lesions.     Mouth: Mucous membranes are moist. No lacerations, oral lesions or angioedema.     Tongue: No lesions. Tongue does not deviate from midline.     Palate: No mass and lesions.     Pharynx: Uvula midline. Pharyngeal swelling and posterior oropharyngeal erythema present. No uvula swelling.     Tonsils: No tonsillar exudate or tonsillar abscesses. 0 on the right. 0 on the left.     Comments: Bilateral allergic shiners; lower eyelids nonpitting edema 1-2+/4 bilaterally; cobblestoning posterior pharynx; bilateral TMs air fluid level clear; mildly TTP maxillary sinuses Eyes:     General: Lids are normal. Vision grossly intact. Gaze aligned appropriately. Allergic shiner present. No scleral icterus.       Right eye: No discharge.        Left eye: No discharge.     Extraocular Movements: Extraocular movements intact.     Conjunctiva/sclera: Conjunctivae normal.  Pupils: Pupils are equal, round, and reactive to light.  Neck:     Trachea: Trachea and phonation normal. No tracheal deviation.  Cardiovascular:     Rate and Rhythm: Normal rate  and regular rhythm.     Pulses: Normal pulses.          Radial pulses are 2+ on the right side and 2+ on the left side.  Pulmonary:     Effort: Pulmonary effort is normal. No respiratory distress.     Breath sounds: Normal breath sounds and air entry. No stridor, decreased air movement or transmitted upper airway sounds. No decreased breath sounds or wheezing.     Comments: Spoke full sentences without difficulty; no cough observed in exam room Abdominal:     General: Abdomen is flat.  Musculoskeletal:        General: Normal range of motion.     Right hand: Normal strength. Normal capillary refill.     Left hand: Normal strength. Normal capillary refill.     Cervical back: Normal range of motion and neck supple. No swelling, edema, deformity, erythema, signs of trauma, lacerations, rigidity, tenderness or crepitus. No pain with movement or muscular tenderness. Normal range of motion.     Thoracic back: No swelling, edema, deformity, signs of trauma, lacerations, spasms or tenderness. Normal range of motion.     Right lower leg: No edema.     Left lower leg: No edema.  Lymphadenopathy:     Head:     Right side of head: No submandibular or preauricular adenopathy.     Left side of head: No submandibular or preauricular adenopathy.     Cervical: No cervical adenopathy.     Right cervical: No superficial cervical adenopathy.    Left cervical: No superficial cervical adenopathy.  Skin:    General: Skin is warm and dry.     Capillary Refill: Capillary refill takes less than 2 seconds.     Coloration: Skin is not ashen, cyanotic, jaundiced, mottled, pale or sallow.     Findings: No abrasion, abscess, acne, bruising, burn, ecchymosis, erythema, signs of injury, laceration, lesion, petechiae, rash or wound.     Nails: There is no clubbing.  Neurological:     General: No focal deficit present.     Mental Status: She is alert and oriented to person, place, and time. Mental status is at baseline.      GCS: GCS eye subscore is 4. GCS verbal subscore is 5. GCS motor subscore is 6.     Cranial Nerves: Cranial nerves 2-12 are intact. No cranial nerve deficit, dysarthria or facial asymmetry.     Sensory: Sensation is intact.     Motor: Motor function is intact. No weakness, tremor, atrophy, abnormal muscle tone or seizure activity.     Coordination: Coordination is intact. Coordination normal.     Gait: Gait is intact. Gait normal.     Comments: In/out of chair without difficulty; gait sure and steady in clinic; bilateral hand grasp equal 5/5  Psychiatric:        Attention and Perception: Attention and perception normal.        Mood and Affect: Mood and affect normal.        Speech: Speech normal.        Behavior: Behavior normal. Behavior is cooperative.        Thought Content: Thought content normal.        Cognition and Memory: Cognition and memory normal.  Judgment: Judgment normal.           Assessment & Plan:  A-intractable headache acute, unspecified, elevated blood pressure without diagnosis of hypertension  P-Patient may use normal saline nasal spray 2 sprays each nostril q2h wa as needed given 1 bottle from clinic stock; consider flonase 39mg 1 spray each nostril BID OTC start after work today.  Patient denied personal or family history of ENT cancer.  OTC antihistamine of choice claritin/zyrtec '10mg'$  po daily.   Claritin generic available in clinic patient given 1 UD from clinic stock and then restart zyrtec at home tomorrow '10mg'$  po daily.   Avoid triggers if possible.  Shower prior to bedtime/after work.  Discussed with patient viral URI circulating in community; consider covid testing home.  Tylenol '1000mg'$  po q6h prn pain. Discussed allergic shiners/lower eyelid swelling/tenderness may be sinus headache.  Blood pressure elevated above her baseline today could be causing headache.  Discussed barometric pressure changes can sometimes precipitate headaches in individuals  and pop up thunderstorms forecastted this week and have occurred past 3 days.  Call or return to clinic as needed if these symptoms worsen or fail to improve as anticipated will consider oral prednisone/antibiotic.   [facility] handouts on sinus pain, headache log, and sinus rinse.  Patient verbalized understanding of instructions, agreed with plan of care and had no further questions at this time.  P2:  Avoidance and hand washing.   Avoid further caffeine intake this afternoon.  1 serving coffee 8 oz tomorrow.  Discussed ER if chest pain, worst headache of life, dyspnea or visual changes for re-evaluation.  Will see RN clinic next week for repeat BP check.  Patient off work rest of this week. [employer] handout dash diet and preventing hypertension.  Patient verbalized understanding information/instructions, agreed with plan of care and had no further questions at this time.

## 2022-07-16 ENCOUNTER — Encounter: Payer: Self-pay | Admitting: Registered Nurse

## 2022-07-16 ENCOUNTER — Telehealth: Payer: Self-pay | Admitting: Registered Nurse

## 2022-07-16 DIAGNOSIS — R030 Elevated blood-pressure reading, without diagnosis of hypertension: Secondary | ICD-10-CM

## 2022-07-16 NOTE — Telephone Encounter (Signed)
Spoke with patient in warehouse.  Still having intermittent headaches above eyebrows.  Will be starting vacation/out of work x 1 month traveling to [place] tomorrow.  Has home blood pressure machine will bring with her.  Drinks some caffeine discussed to limit to one serving 8 oz per day.  She stated yesterday had ate food that had a lot of salt and noticed headache later in the day.  Discussed some people are salt sensitive and salt can increase blood pressure.  Patient noticed some puffiness hands/swelling legs after heavy salt meal also.  Patient came to clinic for BP check with RN left arm sitting normal cuff 142/94.  Denied current headache.  Discussed with patient if repeat blood pressure/log showing blood pressure over 130/80 consistently that I do recommend she start medication to lower blood pressure as this may be reason of recurrent headaches/elevated blood pressure.  Patient stated she will keep log and bring to clinic when she returns from vacation.  She will watch her salt and caffeine intake.  Discussed drinking water to keep urine pale yellow clear and voiding every 2-4 hours when awake.  Patient verbalized understanding information/instructions, agreed with plan of care and had no further questions at this time.

## 2022-08-23 ENCOUNTER — Ambulatory Visit: Payer: No Typology Code available for payment source | Admitting: Registered Nurse

## 2022-08-23 ENCOUNTER — Encounter: Payer: Self-pay | Admitting: Registered Nurse

## 2022-08-23 VITALS — BP 151/100 | HR 88 | Temp 100.6°F | Resp 18

## 2022-08-23 DIAGNOSIS — U071 COVID-19: Secondary | ICD-10-CM

## 2022-08-23 MED ORDER — IBUPROFEN 800 MG PO TABS: 800.0000 mg | ORAL_TABLET | Freq: Three times a day (TID) | ORAL | 0 refills | Status: AC | PRN

## 2022-08-23 MED ORDER — NIRMATRELVIR/RITONAVIR (PAXLOVID)TABLET: 3.0000 | ORAL_TABLET | Freq: Two times a day (BID) | ORAL | 0 refills | Status: AC

## 2022-08-23 MED ORDER — SALINE SPRAY 0.65 % NA SOLN: 2.0000 | NASAL | 0 refills | Status: DC

## 2022-08-23 NOTE — Patient Instructions (Signed)
Nirmatrelvir; Ritonavir Tablets What is this medication? NIRMATRELVIR; RITONAVIR (NIR ma TREL vir; ri TOE na veer) treats mild to moderate COVID-19. It may help people who are at high risk of developing severe illness. This medication works by limiting the spread of the virus in your body. The FDA has allowed the emergency use of this medication. This medicine may be used for other purposes; ask your health care provider or pharmacist if you have questions. COMMON BRAND NAME(S): PAXLOVID What should I tell my care team before I take this medication? They need to know if you have any of these conditions: Any allergies Any serious illness Kidney disease Liver disease An unusual or allergic reaction to nirmatrelvir, ritonavir, other medications, foods, dyes, or preservatives Pregnant or trying to get pregnant Breast-feeding How should I use this medication? This product contains 2 different medications that are packaged together. For the standard dose, take 2 pink tablets of nirmatrelvir with 1 white tablet of ritonavir (3 tablets total) by mouth with water twice daily. Talk to your care team if you have kidney disease. You may need a different dose. Swallow the tablets whole. You can take it with or without food. If it upsets your stomach, take it with food. Take all of this medication unless your care team tells you to stop it early. Keep taking it even if you think you are better. Talk to your care team about the use of this medication in children. While it may be prescribed for children as young as 12 years for selected conditions, precautions do apply. Overdosage: If you think you have taken too much of this medicine contact a poison control center or emergency room at once. NOTE: This medicine is only for you. Do not share this medicine with others. What if I miss a dose? If you miss a dose, take it as soon as you can unless it is more than 8 hours late. If it is more than 8 hours late, skip  the missed dose. Take the next dose at the normal time. Do not take extra or 2 doses at the same time to make up for the missed dose. What may interact with this medication? Do not take this medication with any of the following medications: Alfuzosin Certain medications for anxiety or sleep like midazolam, triazolam Certain medications for cancer like apalutamide, enzalutamide Certain medications for cholesterol like lovastatin, simvastatin Certain medications for irregular heart beat like amiodarone, dronedarone, flecainide, propafenone, quinidine Certain medications for pain like meperidine, piroxicam Certain medications for psychotic disorders like clozapine, lurasidone, pimozide Certain medications for seizures like carbamazepine, phenobarbital, phenytoin Colchicine Eletriptan Eplerenone Ergot alkaloids like dihydroergotamine, ergonovine, ergotamine, methylergonovine Finerenone Flibanserin Ivabradine Lomitapide Naloxegol Ranolazine Rifampin Sildenafil Silodosin St. John's Wort Tolvaptan Ubrogepant Voclosporin This medication may also interact with the following medications: Bedaquiline Birth control pills Bosentan Certain antibiotics like erythromycin or clarithromycin Certain medications for blood pressure like amlodipine, diltiazem, felodipine, nicardipine, nifedipine Certain medications for cancer like abemaciclib, ceritinib, dasatinib, encorafenib, ibrutinib, ivosidenib, neratinib, nilotinib, venetoclax, vinblastine, vincristine Certain medications for cholesterol like atorvastatin, rosuvastatin Certain medications for depression like bupropion, trazodone Certain medications for fungal infections like isavuconazonium, itraconazole, ketoconazole, voriconazole Certain medications for hepatitis C like elbasvir; grazoprevir, dasabuvir; ombitasvir; paritaprevir; ritonavir, glecaprevir; pibrentasvir, sofosbuvir; velpatasvir; voxilaprevir Certain medications for HIV or  AIDS Certain medications for irregular heartbeat like lidocaine Certain medications that treat or prevent blood clots like rivaroxaban, warfarin Digoxin Fentanyl Medications that lower your chance of fighting infection like cyclosporine, sirolimus, tacrolimus Methadone Quetiapine  Rifabutin Salmeterol Steroid medications like betamethasone, budesonide, ciclesonide, dexamethasone, fluticasone, methylprednisolone, mometasone, triamcinolone This list may not describe all possible interactions. Give your health care provider a list of all the medicines, herbs, non-prescription drugs, or dietary supplements you use. Also tell them if you smoke, drink alcohol, or use illegal drugs. Some items may interact with your medicine. What should I watch for while using this medication? Your condition will be monitored carefully while you are receiving this medication. Visit your care team for regular checkups. Tell your care team if your symptoms do not start to get better or if they get worse. If you have untreated HIV infection, this medication may lead to some HIV medications not working as well in the future. Birth control may not work properly while you are taking this medication. Talk to your care team about using an extra method of birth control. What side effects may I notice from receiving this medication? Side effects that you should report to your care team as soon as possible: Allergic reactions--skin rash, itching, hives, swelling of the face, lips, tongue, or throat Liver injury--right upper belly pain, loss of appetite, nausea, light-colored stool, dark yellow or brown urine, yellowing skin or eyes, unusual weakness or fatigue Redness, blistering, peeling, or loosening of the skin, including inside the mouth Side effects that usually do not require medical attention (report these to your care team if they continue or are bothersome): Change in taste Diarrhea General discomfort and  fatigue Increase in blood pressure Muscle pain Nausea Stomach pain This list may not describe all possible side effects. Call your doctor for medical advice about side effects. You may report side effects to FDA at 1-800-FDA-1088. Where should I keep my medication? Keep out of the reach of children and pets. Store at room temperature between 20 and 25 degrees C (68 and 77 degrees F). Get rid of any unused medication after the expiration date. To get rid of medications that are no longer needed or have expired: Take the medication to a medication take-back program. Check with your pharmacy or law enforcement to find a location. If you cannot return the medication, check the label or package insert to see if the medication should be thrown out in the garbage or flushed down the toilet. If you are not sure, ask your care team. If it is safe to put it in the trash, take the medication out of the container. Mix the medication with cat litter, dirt, coffee grounds, or other unwanted substance. Seal the mixture in a bag or container. Put it in the trash. NOTE: This sheet is a summary. It may not cover all possible information. If you have questions about this medicine, talk to your doctor, pharmacist, or health care provider.  2023 Elsevier/Gold Standard (2020-11-23 00:00:00)    Person Under Monitoring Name: Kimberly Hall  Location: [place] [address]   Infection Prevention Recommendations for Individuals Confirmed to have, or Being Evaluated for, 2019 Novel Coronavirus (COVID-19) Infection Who Receive Care at Home  Individuals who are confirmed to have, or are being evaluated for, COVID-19 should follow the prevention steps below until a healthcare provider or local or state health department says they can return to normal activities.  Stay home except to get medical care You should restrict activities outside your home, except for getting medical care. Do not  go to work, school, or public areas, and do not use public transportation or taxis.  Call ahead before visiting your  doctor Before your medical appointment, call the healthcare provider and tell them that you have, or are being evaluated for, COVID-19 infection. This will help the healthcare provider's office take steps to keep other people from getting infected. Ask your healthcare provider to call the local or state health department.  Monitor your symptoms Seek prompt medical attention if your illness is worsening (e.g., difficulty breathing). Before going to your medical appointment, call the healthcare provider and tell them that you have, or are being evaluated for, COVID-19 infection. Ask your healthcare provider to call the local or state health department.  Wear a facemask You should wear a facemask that covers your nose and mouth when you are in the same room with other people and when you visit a healthcare provider. People who live with or visit you should also wear a facemask while they are in the same room with you.  Separate yourself from other people in your home As much as possible, you should stay in a different room from other people in your home. Also, you should use a separate bathroom, if available.  Avoid sharing household items You should not share dishes, drinking glasses, cups, eating utensils, towels, bedding, or other items with other people in your home. After using these items, you should wash them thoroughly with soap and water.  Cover your coughs and sneezes Cover your mouth and nose with a tissue when you cough or sneeze, or you can cough or sneeze into your sleeve. Throw used tissues in a lined trash can, and immediately wash your hands with soap and water for at least 20 seconds or use an alcohol-based hand rub.  Wash your [facility] your hands often and thoroughly with soap and water for at least 20 seconds. You can use an alcohol-based  hand sanitizer if soap and water are not available and if your hands are not visibly dirty. Avoid touching your eyes, nose, and mouth with unwashed hands.   Prevention Steps for Caregivers and Household Members of Individuals Confirmed to have, or Being Evaluated for, COVID-19 Infection Being Cared for in the Home  If you live with, or provide care at home for, a person confirmed to have, or being evaluated for, COVID-19 infection please follow these guidelines to prevent infection:  Follow healthcare provider's instructions Make sure that you understand and can help the patient follow any healthcare provider instructions for all care.  Provide for the patient's basic needs You should help the patient with basic needs in the home and provide support for getting groceries, prescriptions, and other personal needs.  Monitor the patient's symptoms If they are getting sicker, call his or her medical provider and tell them that the patient has, or is being evaluated for, COVID-19 infection. This will help the healthcare provider's office take steps to keep other people from getting infected. Ask the healthcare provider to call the local or state health department.  Limit the number of people who have contact with the patient If possible, have only one caregiver for the patient. Other household members should stay in another home or place of residence. If this is not possible, they should stay in another room, or be separated from the patient as much as possible. Use a separate bathroom, if available. Restrict visitors who do not have an essential need to be in the home.  Keep older adults, very young children, and other sick people away from the patient Keep older adults, very young children, and those who have  compromised immune systems or chronic health conditions away from the patient. This includes people with chronic heart, lung, or kidney conditions, diabetes, and cancer.  Ensure good  ventilation Make sure that shared spaces in the home have good air flow, such as from an air conditioner or an opened window, weather permitting.  Wash your hands often Wash your hands often and thoroughly with soap and water for at least 20 seconds. You can use an alcohol based hand sanitizer if soap and water are not available and if your hands are not visibly dirty. Avoid touching your eyes, nose, and mouth with unwashed hands. Use disposable paper towels to dry your hands. If not available, use dedicated cloth towels and replace them when they become wet.  Wear a facemask and gloves Wear a disposable facemask at all times in the room and gloves when you touch or have contact with the patient's blood, body fluids, and/or secretions or excretions, such as sweat, saliva, sputum, nasal mucus, vomit, urine, or feces.  Ensure the mask fits over your nose and mouth tightly, and do not touch it during use. Throw out disposable facemasks and gloves after using them. Do not reuse. Wash your hands immediately after removing your facemask and gloves. If your personal clothing becomes contaminated, carefully remove clothing and launder. Wash your hands after handling contaminated clothing. Place all used disposable facemasks, gloves, and other waste in a lined container before disposing them with other household waste. Remove gloves and wash your hands immediately after handling these items.  Do not share dishes, glasses, or other household items with the patient Avoid sharing household items. You should not share dishes, drinking glasses, cups, eating utensils, towels, bedding, or other items with a patient who is confirmed to have, or being evaluated for, COVID-19 infection. After the person uses these items, you should wash them thoroughly with soap and water.  Wash laundry thoroughly Immediately remove and wash clothes or bedding that have blood, body fluids, and/or secretions or excretions, such as  sweat, saliva, sputum, nasal mucus, vomit, urine, or feces, on them. Wear gloves when handling laundry from the patient. Read and follow directions on labels of laundry or clothing items and detergent. In general, wash and dry with the warmest temperatures recommended on the label.  Clean all areas the individual has used often Clean all touchable surfaces, such as counters, tabletops, doorknobs, bathroom fixtures, toilets, phones, keyboards, tablets, and bedside tables, every day. Also, clean any surfaces that may have blood, body fluids, and/or secretions or excretions on them. Wear gloves when cleaning surfaces the patient has come in contact with. Use a diluted bleach solution (e.g., dilute bleach with 1 part bleach and 10 parts water) or a household disinfectant with a label that says EPA-registered for coronaviruses. To make a bleach solution at home, add 1 tablespoon of bleach to 1 quart (4 cups) of water. For a larger supply, add  cup of bleach to 1 gallon (16 cups) of water. Read labels of cleaning products and follow recommendations provided on product labels. Labels contain instructions for safe and effective use of the cleaning product including precautions you should take when applying the product, such as wearing gloves or eye protection and making sure you have good ventilation during use of the product. Remove gloves and wash hands immediately after cleaning.  Monitor yourself for signs and symptoms of illness Caregivers and household members are considered close contacts, should monitor their health, and will be asked to limit movement outside  of the home to the extent possible. Follow the monitoring steps for close contacts listed on the symptom monitoring form.   ? If you have additional questions, contact your local health department or call the epidemiologist on call at [phone] (available 24/7). ? This guidance is subject to change. For the most up-to-date guidance from  [facility], please refer to their website: [email or web address]

## 2022-08-23 NOTE — Progress Notes (Signed)
Subjective:    Patient ID: Kimberly Hall, female    DOB: [birth year], 61 y.o.   MRN: [id number]  61y/o caucasian female established patient came to clinic after not feeling well at work.  Cough/runny nose/feeling hot/headache/fatigued/body aches.  Family members with colds at home; they have covid tested and negative.Denied known covid contacts.  Denied wheezing/chest pain/shortness of breath/vertigo/n/v/d.      Review of Systems  Constitutional:  Positive for chills, diaphoresis, fatigue and fever.  HENT:  Positive for congestion, postnasal drip, rhinorrhea, sinus pressure and sinus pain. Negative for ear discharge, ear pain, facial swelling, hearing loss, mouth sores, nosebleeds, sneezing, trouble swallowing and voice change.   Eyes:  Negative for photophobia and visual disturbance.  Respiratory:  Positive for cough. Negative for shortness of breath, wheezing and stridor.   Cardiovascular:  Negative for chest pain, palpitations and leg swelling.  Gastrointestinal:  Negative for abdominal pain, diarrhea, nausea and vomiting.  Endocrine: Negative for cold intolerance and heat intolerance.  Genitourinary:  Negative for difficulty urinating.  Musculoskeletal:  Positive for arthralgias and myalgias. Negative for gait problem, neck pain and neck stiffness.  Skin:  Negative for rash.  Allergic/Immunologic: Negative for food allergies.  Neurological:  Positive for headaches. Negative for dizziness, tremors, seizures, syncope, speech difficulty, weakness, light-headedness and numbness.  Hematological:  Negative for adenopathy. Does not bruise/bleed easily.  Psychiatric/Behavioral:  Negative for agitation, confusion and sleep disturbance.        Objective:   Physical Exam Vitals and nursing note reviewed.  Constitutional:      General: She is awake. She is not in acute distress.    Appearance: Normal appearance. She is well-developed, well-groomed and overweight. She is not  ill-appearing, toxic-appearing or diaphoretic.  HENT:     Head: Normocephalic and atraumatic.     Jaw: There is normal jaw occlusion. No trismus.     Salivary Glands: Right salivary gland is not diffusely enlarged or tender. Left salivary gland is not diffusely enlarged or tender.     Right Ear: Hearing, ear canal and external ear normal. A middle ear effusion is present.     Left Ear: Hearing, ear canal and external ear normal. A middle ear effusion is present.     Nose: Mucosal edema, congestion and rhinorrhea present. No nasal deformity, septal deviation or laceration. Rhinorrhea is clear.     Right Turbinates: Enlarged and swollen. Not pale.     Left Turbinates: Enlarged and swollen. Not pale.     Right Sinus: Maxillary sinus tenderness and frontal sinus tenderness present.     Left Sinus: Maxillary sinus tenderness and frontal sinus tenderness present.     Mouth/Throat:     Lips: Pink. No lesions.     Mouth: Mucous membranes are moist. Mucous membranes are not pale, not dry and not cyanotic. No lacerations, oral lesions or angioedema.     Dentition: Abnormal dentition. Has dentures. No dental abscesses or gum lesions.     Tongue: No lesions. Tongue does not deviate from midline.     Palate: No mass and lesions.     Pharynx: Uvula midline. Pharyngeal swelling and posterior oropharyngeal erythema present. No oropharyngeal exudate or uvula swelling.     Tonsils: No tonsillar exudate or tonsillar abscesses. 0 on the right. 0 on the left.     Comments: Bilateral allergic shiners; cobblestoning posterior pharynx; bilateral TMs air fluid level clear; bilateral frontal and maxillary sinuses TTP; nasal turbinates edema erythema mucous clear/yellow moderate; nasal sniffing noted  in clinic and blew nose after she performed home covid test swabbing Eyes:     General: Lids are normal. Vision grossly intact. Gaze aligned appropriately. Allergic shiner present. No scleral icterus.       Right eye: No  foreign body, discharge or hordeolum.        Left eye: No foreign body, discharge or hordeolum.     Extraocular Movements: Extraocular movements intact.     Right eye: Normal extraocular motion and no nystagmus.     Left eye: Normal extraocular motion and no nystagmus.     Conjunctiva/sclera: Conjunctivae normal.     Right eye: Right conjunctiva is not injected. No chemosis, exudate or hemorrhage.    Left eye: Left conjunctiva is not injected. No chemosis, exudate or hemorrhage.    Pupils: Pupils are equal, round, and reactive to light. Pupils are equal.     Right eye: Pupil is round and reactive.     Left eye: Pupil is round and reactive.  Neck:     Thyroid: No thyroid mass or thyromegaly.     Trachea: Trachea and phonation normal. No tracheal tenderness or tracheal deviation.  Cardiovascular:     Rate and Rhythm: Normal rate and regular rhythm.     Pulses: Normal pulses.          Radial pulses are 2+ on the right side and 2+ on the left side.     Heart sounds: Normal heart sounds, S1 normal and S2 normal. Heart sounds not distant. No murmur heard.    No systolic murmur is present.     No diastolic murmur is present.     No friction rub. No gallop.  Pulmonary:     Effort: Pulmonary effort is normal. No accessory muscle usage or respiratory distress.     Breath sounds: Normal breath sounds and air entry. No stridor, decreased air movement or transmitted upper airway sounds. No decreased breath sounds, wheezing, rhonchi or rales.     Comments: Intermittent throat clearing; nasal sniffing and mildly productive cough observed in clinic; patient given disposable surgical mask to wear in clinic on arrival Chest:     Chest wall: No tenderness.  Abdominal:     General: There is no distension.     Palpations: Abdomen is soft.  Musculoskeletal:        General: No tenderness. Normal range of motion.     Right shoulder: No swelling or deformity. Normal strength.     Left shoulder: No swelling  or deformity. Normal strength.     Right elbow: No swelling, deformity, effusion or lacerations. Normal range of motion.     Left elbow: No swelling, deformity, effusion or lacerations. Normal range of motion.     Right hand: No lacerations. Normal strength. Normal capillary refill.     Left hand: No lacerations. Normal strength. Normal capillary refill.     Cervical back: Normal range of motion and neck supple. No swelling, edema, deformity, erythema, signs of trauma, lacerations, rigidity, spasms, torticollis, tenderness or crepitus. No pain with movement. Normal range of motion.     Thoracic back: No swelling, edema, deformity, signs of trauma, lacerations, spasms or tenderness.     Lumbar back: No swelling, edema, deformity, signs of trauma, lacerations or tenderness.     Right hip: Normal.     Left hip: Normal.     Right knee: Normal.     Left knee: Normal.     Right lower leg: No edema.  Left lower leg: No edema.  Lymphadenopathy:     Head:     Right side of head: No submental, submandibular, tonsillar, preauricular, posterior auricular or occipital adenopathy.     Left side of head: No submental, submandibular, tonsillar, preauricular, posterior auricular or occipital adenopathy.     Cervical: No cervical adenopathy.     Right cervical: No superficial, deep or posterior cervical adenopathy.    Left cervical: No superficial, deep or posterior cervical adenopathy.  Skin:    General: Skin is warm and dry.     Capillary Refill: Capillary refill takes less than 2 seconds.     Coloration: Skin is not ashen, cyanotic, jaundiced, mottled, pale or sallow.     Findings: No abrasion, abscess, acne, bruising, burn, ecchymosis, erythema, signs of injury, laceration, lesion, petechiae, rash or wound.     Nails: There is no clubbing.  Neurological:     General: No focal deficit present.     Mental Status: She is alert and oriented to person, place, and time. Mental status is at baseline. She  is not disoriented.     GCS: GCS eye subscore is 4. GCS verbal subscore is 5. GCS motor subscore is 6.     Cranial Nerves: Cranial nerves 2-12 are intact. No cranial nerve deficit, dysarthria or facial asymmetry.     Sensory: Sensation is intact. No sensory deficit.     Motor: Motor function is intact. No weakness, tremor, atrophy, abnormal muscle tone or seizure activity.     Coordination: Coordination is intact. Coordination normal.     Gait: Gait is intact. Gait normal.     Comments: In/out of chair without difficulty; gait sure and steady in clinic; bilateral hand grasp 5/5 equal   Psychiatric:        Attention and Perception: Attention and perception normal.        Mood and Affect: Mood and affect normal.        Speech: Speech normal.        Behavior: Behavior normal. Behavior is cooperative.        Thought Content: Thought content normal.        Cognition and Memory: Cognition and memory normal.        Judgment: Judgment normal.           Assessment & Plan:   A-lab test positive for covid  P-Patient given free [place] govt home covid test to perform prior to leaving today.  Discussed with patient due to fever will need to go home until fever free 24 hours off nsaids/tylenol even if covid test negative.  Patient test turned positive in less than 5 minutes.  Patient given 1 bottle nasal saline discussed may use 2 sprays each nostril q2h prn congestion/thin out mucous.  Ibuprofen '800mg'$  po TID prn pain/fever #30 RF0 dispensed from [facility] to patient today.    [employer] team notified patient with positive home covid test at work today 08/23/22.  Patient with fever 629.5M per employer policy to go home and start 5 day quarantine.  Day 0 08/23/22  Day 5 1 Oct estimated RTW 2 Oct with strict mask wear through 02 Sep 2022.  Patient did not develop symptoms of  trouble breathing, chest pain, nausea, vomiting, diarrhea. Patient did want to start covid antivirals.  Eligible for paxlovid and  electronic Rx sent to her pharmacy of choice.  Discussed will take 3 pills twice a day for 5 days 2 nirmatrelvir '150mg'$  and 1 rotionavir '100mg'$   every 12 hours #20 and #10 RF0.  Patient will need to pick up from her pharmacy not available onsite through [facility].  Reviewed possible Covid symptoms including cough, shortness of breath with exertion or at rest, runny nose, congestion, sinus pain/pressure, sore throat, fever/chills, body aches, fatigue, loss of taste/smell, GI symptoms of nausea/vomiting/diarrhea. Also reviewed same day/emergent eval/ER precautions of dizziness/syncope, confusion, blue tint to lips/face, severe shortness of breath/difficulty breathing/wheezing.   Patient does not have sp02 monitor.  Given loaner and demonstrated how to use at home and to notify clinic staff if sp02 less than 90 or pulse greater than 100 after rest/hydration.  Patient to isolate in own room and if possible use only one bathroom if living with others in home.  Wear mask when out of room to help prevent spread to others in household.  Sanitize high touch surfaces with lysol/chlorox/bleach spray or wipes daily as viruses are known to live on surfaces from 24 hours to days.  Discussed if family members symptomatic to test today and if asymptomatic to test in 6 days.  May use nasal fluticasone spray 45mg 1 spray each nostril BID prn rhinitis.  Dayquil and nyquil per manufacturer instructions.  Patient has not used tessalon pearles in the past and doesn't want them at this time.  Patient does not have inhaler at home or used in the past.  Discussed if wheezing to notify me and will prescribe albuterol MDI 1-2 puffs po q4-6 hours prn protracted cough/wheezing/chest tightness.   Discussed honey 1 tablespoon every 4 hours is a natural cough suppressant.  Avoid dehydration and drink water to keep urine pale yellow clear and voiding every 2-4 hours while awake.  Patient alert and oriented x3, spoke full  sentences without difficulty.  Some nasal congestion/rhinitis/mildly productive cough and throat clearing in clinic.  Discussed with patient can email me [email or web address] or call [phone] clinic.   Pt verbalized understanding and agreement with plan of care. No further questions/concerns at this time. Pt reminded to contact clinic with any worsening changes in symptoms or questions/concerns or to contact her PCM; ER if red flag e.g. fainting/blue lips/face/worsening chest pain or worst headache of her life. HR notified patient to quarantine through Day 5 RTW estimated Day 6 with strict mask wear through Day 10 and no eating in employee lunch room.   Patient given printed copy FDA paxlovid handout patient/caregiver and exitcare handout on covid quarantine/self care/paxlovid.  Discussed with patient I would follow up with her via telephone.  Pateint verbalized understanding information/instructions, agreed with plan of care and had no further questions at this time.

## 2022-08-24 ENCOUNTER — Telehealth: Payer: Self-pay | Admitting: Registered Nurse

## 2022-08-24 DIAGNOSIS — U071 COVID-19: Secondary | ICD-10-CM

## 2022-08-24 NOTE — Telephone Encounter (Signed)
Spoke with patient via telephone stated feeling much better today.  Went to pharmacy on way home yesterday and picked up paxlovid.  Not having entire body aching today, cough improved since starting paxlovid.  Hasn't checked sp02 today.  Will check this afternoon.  Discussed will call her again tomorrow to follow up sp02 levels and symptoms.  Continue quarantine/hydrate and taking paxlovid.  Patient A&Ox3 some nasal sniffing audible during 2 minute call.  No audible cough/wheezing.  Spoke full sentences without difficulty.  Patient denied questions or concerns at this time "I am feeling much better"  Patient verbalized understanding information/instructions, agreed with plan of care and had no further questions at this time.

## 2022-08-30 ENCOUNTER — Telehealth: Payer: Self-pay | Admitting: Registered Nurse

## 2022-08-30 ENCOUNTER — Encounter: Payer: Self-pay | Admitting: Registered Nurse

## 2022-08-30 DIAGNOSIS — R051 Acute cough: Secondary | ICD-10-CM

## 2022-08-30 MED ORDER — BENZONATATE 200 MG PO CAPS: 200.0000 mg | ORAL_CAPSULE | Freq: Three times a day (TID) | ORAL | 0 refills | Status: AC | PRN

## 2022-08-30 NOTE — Telephone Encounter (Signed)
See tcon dated 08/30/22 Patient returned to work yesterday stated fatigued by end of day but otherwise denied concerns.  Some cough nonproductive responding to honey in tea but bothering her more this morning.  Denied wheezing/shortness of breath/hemoptysis.  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  Sp02 96% RA skin warm dry and pink respirations even and unlabored HR 80 RR 16 gait sure and steady in warehouse spoke full sentences without difficulty.  Throat clearing cough noted x 1 in warehouse nonproductive/dry. Patient instructed to notify clinic staff if dyspnea/hemoptysis/worsening cough.  She would like to try tessalon pearles '200mg'$  po TID prn cough #30 RF0 electronic rx to her pharmacy of choice  Discussed do not chew swallow whole.  Patient verbalized understanding information/instructions, agreed with plan of care and had no further questions at this time.  Day 6 today patient encouraged to wear mask at work and no eating in employee lunch room through Day 10 02 Sep 2022.  Patient verbalized understanding information/instructions, agreed with plan of care and had no further questions at this time.

## 2022-08-30 NOTE — Telephone Encounter (Signed)
Patient returned to work yesterday stated fatigued by end of day but otherwise denied concerns.  Some cough nonproductive responding to honey in tea but bothering her more this morning.  Denied wheezing/shortness of breath/hemoptysis.  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  Sp02 96% RA skin warm dry and pink respirations even and unlabored HR 80 RR 16 gait sure and steady in warehouse spoke full sentences without difficulty.  Throat clearing cough noted x 1 in warehouse nonproductive/dry. Patient instructed to notify clinic staff if dyspnea/hemoptysis/worsening cough.  She would like to try tessalon pearles '200mg'$  po TID prn cough #30 RF0 electronic rx to her pharmacy of choice  Discussed do not chew swallow whole.  Patient verbalized understanding information/instructions, agreed with plan of care and had no further questions at this time.  Day 6 today patient encouraged to wear mask at work and no eating in employee lunch room through Day 10 02 Sep 2022.  Patient verbalized understanding information/instructions, agreed with plan of care and had no further questions at this time.

## 2022-09-10 NOTE — Telephone Encounter (Signed)
Patient seen in workcenter twice this week stated still having some productive cough.  Discussed scheduling appt with me this week for re-evaluation patient stated she would do so.  No cough observed in workcenter spoke full sentences without difficulty respirations even and unlabored RA skin warm dry and pink gait sure and steady A&Ox3.

## 2022-09-13 ENCOUNTER — Ambulatory Visit: Payer: No Typology Code available for payment source | Admitting: Family Medicine

## 2022-09-20 ENCOUNTER — Encounter: Payer: Self-pay | Admitting: Registered Nurse

## 2022-09-20 ENCOUNTER — Ambulatory Visit: Payer: No Typology Code available for payment source | Admitting: Registered Nurse

## 2022-09-20 VITALS — BP 153/100 | HR 82 | Resp 16

## 2022-09-20 DIAGNOSIS — R030 Elevated blood-pressure reading, without diagnosis of hypertension: Secondary | ICD-10-CM

## 2022-09-21 NOTE — Progress Notes (Signed)
Subjective:    Patient ID: Kimberly Hall, female    DOB: Mar 28, 1961, 61 y.o.   MRN: [id number]  61y/o caucasian female established patient here for blood pressure check.  I got bad news at work HR said I have missed too many days from work.  I would like you to write down any days clinic sent me home 2022-2023.    Patient stressed about possibly losing her job.      Review of Systems  Constitutional:  Negative for activity change, appetite change, chills, diaphoresis, fatigue and fever.  HENT:  Negative for trouble swallowing and voice change.   Eyes:  Negative for photophobia and visual disturbance.  Respiratory:  Negative for cough, shortness of breath, wheezing and stridor.   Cardiovascular:  Negative for chest pain, palpitations and leg swelling.  Gastrointestinal:  Negative for diarrhea, nausea and vomiting.  Genitourinary:  Negative for difficulty urinating.  Musculoskeletal:  Negative for gait problem, neck pain and neck stiffness.  Skin:  Negative for rash.  Neurological:  Negative for dizziness, tremors, syncope, facial asymmetry, speech difficulty, weakness, light-headedness and headaches.  Psychiatric/Behavioral:  Negative for agitation, confusion and sleep disturbance. The patient is nervous/anxious.        Objective:   Physical Exam Vitals and nursing note reviewed.  Constitutional:      General: She is awake. She is not in acute distress.    Appearance: Normal appearance. She is well-developed, well-groomed and overweight. She is not ill-appearing, toxic-appearing or diaphoretic.  HENT:     Head: Normocephalic and atraumatic.     Jaw: There is normal jaw occlusion.     Salivary Glands: Right salivary gland is not diffusely enlarged. Left salivary gland is not diffusely enlarged.     Right Ear: Hearing and external ear normal.     Left Ear: Hearing and external ear normal.     Nose: Nose normal. No congestion or rhinorrhea.     Mouth/Throat:     Lips: Pink. No  lesions.     Mouth: Mucous membranes are moist.     Pharynx: Oropharynx is clear.  Eyes:     General: Lids are normal. Vision grossly intact. Gaze aligned appropriately. Allergic shiner present. No scleral icterus.       Right eye: No discharge.        Left eye: No discharge.     Extraocular Movements: Extraocular movements intact.     Conjunctiva/sclera: Conjunctivae normal.     Pupils: Pupils are equal, round, and reactive to light.  Neck:     Trachea: Trachea and phonation normal. No tracheal deviation.  Cardiovascular:     Rate and Rhythm: Normal rate and regular rhythm.     Pulses:          Radial pulses are 2+ on the right side and 2+ on the left side.     Heart sounds: Normal heart sounds, S1 normal and S2 normal.  Pulmonary:     Effort: Pulmonary effort is normal.     Breath sounds: Normal breath sounds and air entry. No stridor, decreased air movement or transmitted upper airway sounds. No decreased breath sounds, wheezing, rhonchi or rales.     Comments: Spoke full sentences without difficulty; no cough observed in exam room Abdominal:     General: Abdomen is flat.  Musculoskeletal:        General: Normal range of motion.     Right hand: Normal strength. Normal capillary refill.     Left hand:  Normal strength. Normal capillary refill.     Cervical back: Normal range of motion and neck supple. No edema, erythema, signs of trauma, rigidity, torticollis or crepitus. No pain with movement. Normal range of motion.     Right lower leg: No edema.     Left lower leg: No edema.  Lymphadenopathy:     Head:     Right side of head: No submandibular or preauricular adenopathy.     Left side of head: No submandibular or preauricular adenopathy.     Cervical:     Right cervical: No superficial cervical adenopathy.    Left cervical: No superficial cervical adenopathy.  Skin:    General: Skin is warm and dry.     Capillary Refill: Capillary refill takes less than 2 seconds.      Coloration: Skin is not ashen, cyanotic, jaundiced, mottled, pale or sallow.     Findings: No abrasion, bruising, burn, erythema, signs of injury, laceration, petechiae, rash or wound.  Neurological:     General: No focal deficit present.     Mental Status: She is alert and oriented to person, place, and time. Mental status is at baseline.     GCS: GCS eye subscore is 4. GCS verbal subscore is 5. GCS motor subscore is 6.     Cranial Nerves: Cranial nerves 2-12 are intact. No cranial nerve deficit, dysarthria or facial asymmetry.     Sensory: Sensation is intact.     Motor: Motor function is intact. No weakness, tremor, atrophy, abnormal muscle tone or seizure activity.     Coordination: Coordination is intact. Coordination normal.     Gait: Gait is intact. Gait normal.     Comments: In/out of chair without difficulty; gait sure and steady in clinic; bilateral hand grasp equal 5/5  Psychiatric:        Attention and Perception: Attention and perception normal.        Mood and Affect: Mood is anxious. Affect is tearful.        Speech: Speech normal.        Behavior: Behavior normal. Behavior is cooperative.        Thought Content: Thought content normal.        Cognition and Memory: Cognition and memory normal.        Judgment: Judgment normal.       Last labs Jun 2023  Latest Reference Range & Units 04/29/22 10:15  Sodium 134 - 144 mmol/L 139  Potassium 3.5 - 5.2 mmol/L 4.0  Chloride 96 - 106 mmol/L 102  Glucose 70 - 99 mg/dL 101 (H)  BUN 8 - 27 mg/dL 14  Creatinine 0.57 - 1.00 mg/dL 0.64  Calcium 8.7 - 10.3 mg/dL 8.7  BUN/Creatinine Ratio 12 - 28  22  eGFR >59 mL/min/1.73 100  Phosphorus 3.0 - 4.3 mg/dL 2.9 (L)  Magnesium 1.6 - 2.3 mg/dL 1.9  Alkaline Phosphatase 44 - 121 IU/L 77  Albumin 3.8 - 4.8 g/dL 4.1  Albumin/Globulin Ratio 1.2 - 2.2  1.7  Uric Acid 3.0 - 7.2 mg/dL 3.5  AST 0 - 40 IU/L 19  ALT 0 - 32 IU/L 14  Total Protein 6.0 - 8.5 g/dL 6.5  Total Bilirubin 0.0 -  1.2 mg/dL 0.5  GGT 0 - 60 IU/L 15  Estimated CHD Risk 0.0 - 1.0 times avg. 0.5  LDH 119 - 226 IU/L 205  Total CHOL/HDL Ratio 0.0 - 4.4 ratio 3.4  Cholesterol, Total 100 - 199 mg/dL 231 (H)  HDL Cholesterol >39 mg/dL 67  Triglycerides 0 - 149 mg/dL 101  VLDL Cholesterol Cal 5 - 40 mg/dL 18  LDL Chol Calc (NIH) 0 - 99 mg/dL 146 (H)  Iron 27 - 139 ug/dL 92  Vitamin D, 25-Hydroxy 30.0 - 100.0 ng/mL 25.4 (L)  Globulin, Total 1.5 - 4.5 g/dL 2.4  WBC 3.4 - 10.8 x10E3/uL 5.6  RBC 3.77 - 5.28 x10E6/uL 3.93  Hemoglobin 11.1 - 15.9 g/dL 12.2  HCT 34.0 - 46.6 % 36.0  MCV 79 - 97 fL 92  MCH 26.6 - 33.0 pg 31.0  MCHC 31.5 - 35.7 g/dL 33.9  RDW 11.7 - 15.4 % 13.0  Platelets 150 - 450 x10E3/uL 218  Neutrophils Not Estab. % 63  Immature Granulocytes Not Estab. % 0  NEUT# 1.4 - 7.0 x10E3/uL 3.5  Lymphocyte # 0.7 - 3.1 x10E3/uL 1.6  Monocytes Absolute 0.1 - 0.9 x10E3/uL 0.4  Basophils Absolute 0.0 - 0.2 x10E3/uL 0.0  Immature Grans (Abs) 0.0 - 0.1 x10E3/uL 0.0  Lymphs Not Estab. % 29  Monocytes Not Estab. % 6  Basos Not Estab. % 1  Eos Not Estab. % 1  EOS (ABSOLUTE) 0.0 - 0.4 x10E3/uL 0.1  Hemoglobin A1C 4.8 - 5.6 % 5.5  TSH 0.450 - 4.500 uIU/mL 2.650  Thyroxine (T4) 4.5 - 12.0 ug/dL 7.3  Free Thyroxine Index 1.2 - 4.9  1.8  T3 Uptake Ratio 24 - 39 % 24  (H): Data is abnormally high (L): Data is abnormally low    Reviewed HR communications and Epic with patient given written summary 2022 10/31 URI symptoms started on covid quarantine/isolation sent home; 11/1 sent home again as still pending results covid test; negative 11/2 treated for influenza RTW 10/07/21 Orthopedics appt hand pain 09/14/21,  Hand pain evaluation with me 07/20/21, 04/29/21 Covid exposure at work coworker quarantine 5 days 02/23/21 RTW 03/02/21 Covid exposure at home 1/10 test 1/16 home quarantine as family member  2023 9/26 symptoms and positive covid test in clinic estimated RTW 10/02 8/20 started vacation x 1  month 06/28/22 headache/elevated blood pressure 03/08/22 GI colonoscopy 01/28/22 seen for bronchitis at [facility] (chronic cough) and rectal bleeding GI  Assessment & Plan:  A-elevated blood pressure in office without hypertension diagnosis, situational stressor  P-Discussed avoid further caffeine this afternoon.   Discussed ER if chest pain, worst headache of life, dyspnea or visual changes for re-evaluation.  Will see clinic staff Thursday for repeat BP check.  Exitcare handout preventing hypertension.  Patient verbalized understanding information/instructions, agreed with plan of care and had no further questions at this time.   Reviewed HR communications and Epic with patient given written summary.  Discussed with patient I do not have access to [employer] email as she is no longer employed and email accounts were terminated.  Patient blood pressure improved and crying stopped.  Patient reported she was feeling better after taking walk outside and coming back to clinic for repeat BP prior to closure.  Cleared to return to work.  Patient verbalized understanding information/instructions and had no further questions at this time.

## 2022-09-21 NOTE — Patient Instructions (Addendum)
Preventing Hypertension Hypertension, also called high blood pressure, is when the force of blood pumping through the arteries is too strong. Arteries are blood vessels that carry blood from the heart throughout the body. Often, hypertension does not cause symptoms until blood pressure is very high. It is important to have your blood pressure checked regularly. Diet and lifestyle changes can help you prevent hypertension, and they may make you feel better overall and improve your quality of life. If you already have hypertension, you may control it with diet and lifestyle changes, as well as with medicine. How can this condition affect me? Over time, hypertension can damage the arteries and decrease blood flow to important parts of the body, including the brain, heart, and kidneys. By keeping your blood pressure in a healthy range, you can help prevent complications like heart attack, heart failure, stroke, kidney failure, and vascular dementia. What can increase my risk? An unhealthy diet and a lack of physical activity can make you more likely to develop high blood pressure. Some other risk factors include: Age. The risk increases with age. Having family members who have had high blood pressure. Having certain health conditions, such as thyroid problems. Being overweight or obese. Drinking too much alcohol or caffeine. Having too much fat, sugar, calories, or salt (sodium) in your diet. Smoking or using illegal drugs. Taking certain medicines, such as antidepressants, decongestants, birth control pills, and NSAIDs, such as ibuprofen. What actions can I take to prevent or manage this condition? Work with your health care provider to make a hypertension prevention plan that works for you. You may be referred for counseling on a healthy diet and physical activity. Follow your plan and keep all follow-up visits. Diet changes Maintain a healthy diet. This includes: Eating less salt (sodium). Ask your  health care provider how much sodium is safe for you to have. The general recommendation is to have less than 1 tsp (2,300 mg) of sodium a day. Do not add salt to your food. Choose low-sodium options when grocery shopping and eating out. Limiting fats in your diet. You can do this by eating low-fat or fat-free dairy products and by eating less red meat. Eating more fruits, vegetables, and whole grains. Make a goal to eat: 1-2 cups of fresh fruits and vegetables each day. 3-4 servings of whole grains each day. Avoiding foods and beverages that have added sugars. Eating fish that contain healthy fats (omega-3 fatty acids), such as mackerel or salmon. If you need help putting together a healthy eating plan, try the DASH diet. This diet is high in fruits, vegetables, and whole grains. It is low in sodium, red meat, and added sugars. DASH stands for Dietary Approaches to Stop Hypertension. Lifestyle changes  Lose weight if you are overweight. Losing just 3-5% of your body weight can help prevent or control hypertension. For example, if your present weight is 200 lb (91 kg), a loss of 3-5% of your weight means losing 6-10 lb (2.7-4.5 kg). Ask your health care provider to help you with a diet and exercise plan to safely lose weight. Get enough exercise. Do at least 150 minutes of moderate-intensity exercise each week. You could do this in short exercise sessions several times a day, or you could do longer exercise sessions a few times a week. For example, you could take a brisk 10-minute walk or bike ride, 3 times a day, for 5 days a week. Find ways to reduce stress, such as exercising, meditating, listening to   music, or taking a yoga class. If you need help reducing stress, ask your health care provider. Do not use any products that contain nicotine or tobacco. These products include cigarettes, chewing tobacco, and vaping devices, such as e-cigarettes. Chemicals in tobacco and nicotine products raise your  blood pressure each time you use them. If you need help quitting, ask your health care provider. Learn how to check your blood pressure at home. Make sure that you know your personal target blood pressure, as told by your health care provider. Try to sleep 7-9 hours per night. Alcohol use Do not drink alcohol if: Your health care provider tells you not to drink. You are pregnant, may be pregnant, or are planning to become pregnant. If you drink alcohol: Limit how much you have to: 0-1 drink a day for women. 0-2 drinks a day for men. Know how much alcohol is in your drink. In the [place] one drink equals one 12 oz bottle of beer (355 mL), one 5 oz glass of wine (148 mL), or one 1 oz glass of hard liquor (44 mL). Medicines In addition to diet and lifestyle changes, your health care provider may recommend medicines to help lower your blood pressure. In general: You may need to try a few different medicines to find what works best for you. You may need to take more than one medicine. Take over-the-counter and prescription medicines only as told by your health care provider. Questions to ask your health care provider What is my blood pressure goal? How can I lower my risk for high blood pressure? How should I monitor my blood pressure at home? Where to find support Your health care provider can help you prevent hypertension and help you keep your blood pressure at a healthy level. Your local hospital or your community may also provide support services and prevention programs. The American Heart Association offers an online support network at supportnetwork.heart.org Where to find more information Learn more about hypertension from: National Heart, Lung, and Blood Institute: www.nhlbi.nih.gov Centers for Disease Control and Prevention: www.cdc.gov American Academy of Family Physicians: familydoctor.org Learn more about the DASH diet from: National Heart, Lung, and Blood Institute:  www.nhlbi.nih.gov Contact a health care provider if: You think you are having a reaction to medicines you have taken. You have recurrent headaches or feel dizzy. You have swelling in your ankles. You have trouble with your vision. Get help right away if: You have sudden, severe chest, back, or abdominal pain or discomfort. You have shortness of breath. You have a sudden, severe headache. These symptoms may be an emergency. Get help right away. Call 911. Do not wait to see if the symptoms will go away. Do not drive yourself to the hospital. Summary Hypertension often does not cause any symptoms until blood pressure is very high. It is important to get your blood pressure checked regularly. Diet and lifestyle changes are important steps in preventing hypertension. By keeping your blood pressure in a healthy range, you may prevent complications like heart attack, heart failure, stroke, and kidney failure. Work with your health care provider to make a hypertension prevention plan that works for you. This information is not intended to replace advice given to you by your health care provider. Make sure you discuss any questions you have with your health care provider. Document Revised: 09/02/2021 Document Reviewed: 09/02/2021 Elsevier Patient Education  2023 Elsevier Inc. DASH Eating Plan DASH stands for Dietary Approaches to Stop Hypertension. The DASH eating plan is a   healthy eating plan that has been shown to: Reduce high blood pressure (hypertension). Reduce your risk for type 2 diabetes, heart disease, and stroke. Help with weight loss. What are tips for following this plan? Reading food labels Check food labels for the amount of salt (sodium) per serving. Choose foods with less than 5 percent of the Daily Value of sodium. Generally, foods with less than 300 milligrams (mg) of sodium per serving fit into this eating plan. To find whole grains, look for the word "whole" as the first word  in the ingredient list. Shopping Buy products labeled as "low-sodium" or "no salt added." Buy fresh foods. Avoid canned foods and pre-made or frozen meals. Cooking Avoid adding salt when cooking. Use salt-free seasonings or herbs instead of table salt or sea salt. Check with your health care provider or pharmacist before using salt substitutes. Do not fry foods. Cook foods using healthy methods such as baking, boiling, grilling, roasting, and broiling instead. Cook with heart-healthy oils, such as olive, canola, avocado, soybean, or sunflower oil. Meal planning  Eat a balanced diet that includes: 4 or more servings of fruits and 4 or more servings of vegetables each day. Try to fill one-half of your plate with fruits and vegetables. 6-8 servings of whole grains each day. Less than 6 oz (170 g) of lean meat, poultry, or fish each day. A 3-oz (85-g) serving of meat is about the same size as a deck of cards. One egg equals 1 oz (28 g). 2-3 servings of low-fat dairy each day. One serving is 1 cup (237 mL). 1 serving of nuts, seeds, or beans 5 times each week. 2-3 servings of heart-healthy fats. Healthy fats called omega-3 fatty acids are found in foods such as walnuts, flaxseeds, fortified milks, and eggs. These fats are also found in cold-water fish, such as sardines, salmon, and mackerel. Limit how much you eat of: Canned or prepackaged foods. Food that is high in trans fat, such as some fried foods. Food that is high in saturated fat, such as fatty meat. Desserts and other sweets, sugary drinks, and other foods with added sugar. Full-fat dairy products. Do not salt foods before eating. Do not eat more than 4 egg yolks a week. Try to eat at least 2 vegetarian meals a week. Eat more home-cooked food and less restaurant, buffet, and fast food. Lifestyle When eating at a restaurant, ask that your food be prepared with less salt or no salt, if possible. If you drink alcohol: Limit how much  you use to: 0-1 drink a day for women who are not pregnant. 0-2 drinks a day for men. Be aware of how much alcohol is in your drink. In the [place] one drink equals one 12 oz bottle of beer (355 mL), one 5 oz glass of wine (148 mL), or one 1 oz glass of hard liquor (44 mL). General information Avoid eating more than 2,300 mg of salt a day. If you have hypertension, you may need to reduce your sodium intake to 1,500 mg a day. Work with your health care provider to maintain a healthy body weight or to lose weight. Ask what an ideal weight is for you. Get at least 30 minutes of exercise that causes your heart to beat faster (aerobic exercise) most days of the week. Activities may include walking, swimming, or biking. Work with your health care provider or dietitian to adjust your eating plan to your individual calorie needs. What foods should I eat? Fruits All   fresh, dried, or frozen fruit. Canned fruit in natural juice (without added sugar). Vegetables Fresh or frozen vegetables (raw, steamed, roasted, or grilled). Low-sodium or reduced-sodium tomato and vegetable juice. Low-sodium or reduced-sodium tomato sauce and tomato paste. Low-sodium or reduced-sodium canned vegetables. Grains Whole-grain or whole-wheat bread. Whole-grain or whole-wheat pasta. Brown rice. Oatmeal. Quinoa. Bulgur. Whole-grain and low-sodium cereals. Pita bread. Low-fat, low-sodium crackers. Whole-wheat flour tortillas. Meats and other proteins Skinless chicken or turkey. Ground chicken or turkey. Pork with fat trimmed off. Fish and seafood. Egg whites. Dried beans, peas, or lentils. Unsalted nuts, nut butters, and seeds. Unsalted canned beans. Lean cuts of beef with fat trimmed off. Low-sodium, lean precooked or cured meat, such as sausages or meat loaves. Dairy Low-fat (1%) or fat-free (skim) milk. Reduced-fat, low-fat, or fat-free cheeses. Nonfat, low-sodium ricotta or cottage cheese. Low-fat or nonfat yogurt. Low-fat,  low-sodium cheese. Fats and oils Soft margarine without trans fats. Vegetable oil. Reduced-fat, low-fat, or light mayonnaise and salad dressings (reduced-sodium). Canola, safflower, olive, avocado, soybean, and sunflower oils. Avocado. Seasonings and condiments Herbs. Spices. Seasoning mixes without salt. Other foods Unsalted popcorn and pretzels. Fat-free sweets. The items listed above may not be a complete list of foods and beverages you can eat. Contact a dietitian for more information. What foods should I avoid? Fruits Canned fruit in a light or heavy syrup. Fried fruit. Fruit in cream or butter sauce. Vegetables Creamed or fried vegetables. Vegetables in a cheese sauce. Regular canned vegetables (not low-sodium or reduced-sodium). Regular canned tomato sauce and paste (not low-sodium or reduced-sodium). Regular tomato and vegetable juice (not low-sodium or reduced-sodium). Pickles. Olives. Grains Baked goods made with fat, such as croissants, muffins, or some breads. Dry pasta or rice meal packs. Meats and other proteins Fatty cuts of meat. Ribs. Fried meat. Bacon. Bologna, salami, and other precooked or cured meats, such as sausages or meat loaves. Fat from the back of a pig (fatback). Bratwurst. Salted nuts and seeds. Canned beans with added salt. Canned or smoked fish. Whole eggs or egg yolks. Chicken or turkey with skin. Dairy Whole or 2% milk, cream, and half-and-half. Whole or full-fat cream cheese. Whole-fat or sweetened yogurt. Full-fat cheese. Nondairy creamers. Whipped toppings. Processed cheese and cheese spreads. Fats and oils Butter. Stick margarine. Lard. Shortening. Ghee. Bacon fat. Tropical oils, such as coconut, palm kernel, or palm oil. Seasonings and condiments Onion salt, garlic salt, seasoned salt, table salt, and sea salt. Worcestershire sauce. Tartar sauce. Barbecue sauce. Teriyaki sauce. Soy sauce, including reduced-sodium. Steak sauce. Canned and packaged gravies.  Fish sauce. Oyster sauce. Cocktail sauce. Store-bought horseradish. Ketchup. Mustard. Meat flavorings and tenderizers. Bouillon cubes. Hot sauces. Pre-made or packaged marinades. Pre-made or packaged taco seasonings. Relishes. Regular salad dressings. Other foods Salted popcorn and pretzels. The items listed above may not be a complete list of foods and beverages you should avoid. Contact a dietitian for more information. Where to find more information National Heart, Lung, and Blood Institute: www.nhlbi.nih.gov American Heart Association: www.heart.org Academy of Nutrition and Dietetics: www.eatright.org National Kidney Foundation: www.kidney.org Summary The DASH eating plan is a healthy eating plan that has been shown to reduce high blood pressure (hypertension). It may also reduce your risk for type 2 diabetes, heart disease, and stroke. When on the DASH eating plan, aim to eat more fresh fruits and vegetables, whole grains, lean proteins, low-fat dairy, and heart-healthy fats. With the DASH eating plan, you should limit salt (sodium) intake to 2,300 mg a day. If you have   hypertension, you may need to reduce your sodium intake to 1,500 mg a day. Work with your health care provider or dietitian to adjust your eating plan to your individual calorie needs. This information is not intended to replace advice given to you by your health care provider. Make sure you discuss any questions you have with your health care provider. Document Revised: 10/18/2019 Document Reviewed: 10/18/2019 Elsevier Patient Education  2023 Elsevier Inc.  

## 2022-09-22 ENCOUNTER — Ambulatory Visit: Payer: No Typology Code available for payment source | Admitting: Registered Nurse

## 2022-09-22 VITALS — BP 128/86 | HR 83 | Resp 16

## 2022-09-22 DIAGNOSIS — R030 Elevated blood-pressure reading, without diagnosis of hypertension: Secondary | ICD-10-CM

## 2022-09-22 NOTE — Progress Notes (Signed)
Patient reported home machine yesterday 130s/80s and feeling well today.

## 2022-09-24 ENCOUNTER — Telehealth: Payer: Self-pay | Admitting: Registered Nurse

## 2022-09-24 ENCOUNTER — Encounter: Payer: Self-pay | Admitting: Registered Nurse

## 2022-09-24 DIAGNOSIS — R7303 Prediabetes: Secondary | ICD-10-CM

## 2022-09-24 DIAGNOSIS — E559 Vitamin D deficiency, unspecified: Secondary | ICD-10-CM

## 2022-09-24 NOTE — Telephone Encounter (Signed)
Ref Range & Units 3 d ago Comments  Total Vitamin D 25-Hydroxy 30 - 100 ng/mL 21 Low   Deficient: <20 ng/mL  Insufficient: 20-30 ng/mL  Sufficient: 30-100 ng/mL  Upper Safety Limit/Toxicity: >100 ng/mL   Total Vitamin D includes D2 and D3.  D3 can originate from both endogenous and supplemental sources, whereas D2 is not endogenously produced.  Therapy is based on Total 25(OH) Vitamin D with concentrations <20 ng/mL being indicative of Vitamin D deficiency.  Optimal concentrations should be >30 ng/mL.  Resulting Agency  [facility]   Specimen Collected: 09/21/22 11:36   Performed by: [facility] Last Resulted: 09/21/22 19:14  Received From: [place]  Result Received: 09/22/22 23:39   View Encounter   Recent Data from [place] Related to 25(OH) Vitamin D Total Component 09/21/22 06/17/21 02/13/20 11/26/18 10/09/17  Total Vitamin D 25-Hydroxy 21 Low   23 Low   28 Low   20 Low   21 Low      Ref Range & Units 3 d ago Comments  Total Cholesterol 25 - 199 MG/DL 250 High     Triglycerides 10 - 150 MG/DL 149    HDL Cholesterol 35 - 135 MG/DL 73    LDL Cholesterol Calculated 0 - 99 MG/DL 147 High     Total Chol / HDL Cholesterol <4.5 3.4    Non-HDL Cholesterol MG/DL 177   TARGET: <(LDL-C TARGET + 30)MG/DL  Coronary Heart Disease Risk Table   TOTAL CHOLESTEROL/HDL RATIO:CHD RISK  CORONARY HEART DISEASE RISK TABLE                       MEN     WOMEN  1/2 AVERAGE RISK    3.4      3.3  AVERAGE RISK        5.8      4.4  2 X AVERAGE RISK    9.6      7.1  3 X AVERAGE RISK    23.4     11.0   Use the calculated Patient Ratio and the CHD Risk Table to determine the patient's CHD Risk.   ATP III Classification (LDL):  <100       mg/dl     Optimal  100-129    mg/dl     Near or Above Optimal  130-159    mg/dl     Borderline  160-189    mg/dl     High  >190       mg/dl     Very High  Resulting  Agency  [facility]   Specimen Collected: 09/21/22 11:36   Performed by: [place] Last Resulted: 09/21/22 16:12  Received From: [place]  Result Received: 09/22/22 23:39   View Encounter   Recent Data from [place] Related to Lipid Profile Component 09/21/22 06/17/21 02/13/20 11/26/18 10/02/17 09/26/16  Total Cholesterol 250 High  221 High  244 High  237 High  228 High  243 High   Triglycerides 149 127 193 High  138 121 220 High   HDL Cholesterol 73 67 58 70 75 92  LDL Cholesterol Calculated 147 High  129 High  147 High  139  129  --  Total Chol / HDL Cholesterol 3.4 3.3 4.2 3.4  3.0  --  Non-HDL Cholesterol 177  154  186  167 153 151  Coronary Heart Disease Risk Table --  --  --  -- -- --    Ref Range & Units 3 d ago Comments  Sodium 135 - 146 MMOL/L 137    Potassium 3.5 - 5.3 MMOL/L 4.1    Chloride 98 - 110 MMOL/L 105    CO2 23 - 30 MMOL/L 26    BUN 8 - 24 MG/DL 17    Glucose 70 - 99 MG/DL 96  Patients taking eltrombopag at doses >/= 100 mg daily may show falsely elevated values of 10% or greater.  Creatinine 0.50 - 1.50 MG/DL 0.66    Calcium 8.5 - 10.5 MG/DL 8.9    Total Protein 6.0 - 8.3 G/DL 7.1  Patients taking eltrombopag at doses >/= 100 mg daily may show falsely elevated values of 10% or greater.  Albumin  3.5 - 5.0 G/DL 4.3    Total Bilirubin 0.1 - 1.2 MG/DL 0.5  Patients taking eltrombopag at doses >/= 100 mg daily may show falsely elevated values of 10% or greater.  Alkaline Phosphatase 25 - 125 IU/L or U/L 64    AST (SGOT) 5 - 40 IU/L or U/L 19    ALT (SGPT) 5 - 50 IU/L or U/L 17    Anion Gap 4 - 14 MMOL/L 6    Est. GFR >=60 ML/MIN/1.73 M*2 >90  GFR estimated by CKD-EPI equations(NKF 2021).   "Recommend confirmation of Cr-based eGFR by using Cys-based eGFR and other filtration markers (if applicable) in complex cases and clinical decision-making, as needed."  [place]   Specimen Collected: 09/21/22 11:36   Performed by: [place] Last Resulted: 09/21/22 16:12  Received From: [place]  Result Received: 09/22/22 23:39  Results - (ABNORMAL) CBC and Differential (09/21/2022 11:36 AM EDT) Component Value Ref Range Test Method Analysis Time Performed At Pathologist Signature  WBC 5.7 4.4 - 11.0 x 10*3/uL   09/21/2022 3:22 PM EDT [facility] [place]    RBC 4.09 (L) 4.10 - 5.10 x 10*6/uL   09/21/2022 3:22 PM EDT [facility]    Hemoglobin 12.8 12.3 - 15.3 G/DL   09/21/2022 3:22 PM EDT [place]    Hematocrit 38.4 35.9 - 44.6 %   09/21/2022 3:22 PM EDT [place]    MCV 94.0 80.0 - 96.0 FL   09/21/2022 3:22 PM EDT [facility] [place]    MCH 31.2 27.5 - 33.2 PG   09/21/2022 3:22 PM EDT [facility] [place]    MCHC 33.2 33.0 - 37.0 G/DL   09/21/2022 3:22 PM EDT [facility] [place]    RDW 14.1 12.3 - 17.0 %   09/21/2022 3:22 PM EDT [place]    Platelets 201 150 - 450 X 10*3/uL   09/21/2022 3:22 PM EDT [facility] [place]    MPV 9.3 6.8 - 10.2 FL   09/21/2022 3:22 PM EDT [facility]    Neutrophil % 60 %   09/21/2022 3:22 PM EDT [facility]    Lymphocyte % 30 %   09/21/2022 3:22 PM EDT [place]    Monocyte % 8 %   09/21/2022 3:22 PM EDT [facility]    Eosinophil % 2 %   09/21/2022 3:22 PM EDT [facility]    Basophil % 1 %  09/21/2022 3:22 PM EDT [facility]    Neutrophil Absolute 3.4 1.8 - 7.8 x 10*3/uL   09/21/2022 3:22 PM EDT [facility]    Lymphocyte Absolute 1.7 1.0 - 4.8 x 10*3/uL   09/21/2022 3:22 PM EDT [facility]    Monocyte Absolute 0.5 0.0 - 0.8 x 10*3/uL   09/21/2022 3:22 PM EDT [facility]    Eosinophil Absolute 0.1 0.0 - 0.5 x 10*3/uL   09/21/2022 3:22 PM EDT [facility]     Basophil Absolute 0.1 0.0 - 0.2 x 10*3/uL   09/21/2022 3:22 PM EDT [place]     Lab Results - (ABNORMAL) CBC and Differential (09/21/2022 11:36 AM EDT) Specimen (Source) Anatomical Location / Laterality Collection Method / Volume Collection Time Received Time  Blood   Venipuncture / Unknown 09/21/2022 11:36 AM EDT 09/21/2022 3:15 PM EDT   Lab Results - (ABNORMAL) CBC and Differential (09/21/2022 11:36 AM EDT) Narrative       Lab Results - (ABNORMAL) CBC and Differential (09/21/2022 11:36 AM EDT) Authorizing Provider Result Type  Randie Heinz FNP LAB BLOOD ORDERABLES   Lab Results - (ABNORMAL) CBC and Differential (09/21/2022 11:36 AM EDT) Performing Organization Address City/State/ZIP Code Phone Number  [place]   CLIA# [id number]   [place]   [place], [place] [address]       Epic reviewed patient had appt with PCM 09/21/22  had CMET and vitamin D level drawn no A1c.  D low will schedule for A1c in 3 months.  Will follow up with patient to verify if started Vitamin D supplement for the winter.  Left message.  Noted BP 138/82 at appt and amlodipine 2.58m ordered by PCM.

## 2022-09-26 NOTE — Telephone Encounter (Signed)
Patient seen in clinic 10/24 and 10/26 see office notes.  No cough observed/respirations even and unlabored RA skin warm dry and pink gait sure and steady. VSS.

## 2022-09-27 ENCOUNTER — Encounter: Payer: Self-pay | Admitting: Registered Nurse

## 2022-09-27 ENCOUNTER — Ambulatory Visit: Payer: No Typology Code available for payment source | Admitting: Registered Nurse

## 2022-09-27 VITALS — BP 142/97 | HR 78 | Resp 16

## 2022-09-27 DIAGNOSIS — R030 Elevated blood-pressure reading, without diagnosis of hypertension: Secondary | ICD-10-CM

## 2022-09-27 MED ORDER — CHOLECALCIFEROL 1.25 MG (50000 UT) PO TABS: 1.0000 | ORAL_TABLET | ORAL | 0 refills | Status: AC

## 2022-09-28 NOTE — Telephone Encounter (Signed)
Patient to clinic to review [facility] labs with NP.  Vitamin D low and she has not been taking supplement consistently.  She would like to try cholecalciferol 50,000 units po weekly electronic Rx sent to her pharmacy of choice #12 [facility] and she should schedule follow up nonfasting recheck in 3 months.  She stated she would pick up today and start supplement.  She is to follow up with her PCM regarding lab results/instructions also.  Total and LDL cholesterol high but ratio good 3.4 as HDL 73 discussed increase fiber/whole grains/fruits/vegetables in diet and recheck in 1 year/activity 150 minutes/week.  Electrolytes, kidney/liver function normal.  RBC slightly low and Hgb low normal. Menopausal.  Stopped smoking years ago.  Iron normal Jun 2023.  Had GI workup for rectal bleeding this year.  Sometimes has bright red blood from rectum.  Colonoscopy 03/08/22 some diverticula noted and polyps precancerous "adematous" removed recommended to start citrucel one tablespoon daily and repeat colonoscopy in 7 years.  Discussed with patient if new or worsening bowel symptoms especially weight loss, changes in stool frequency, black/tarry/coffee ground stools or worsening bloody stools to follow up with GI sooner.  Patient verbalized understanding information/instructions, agreed with plan of care and had no further questions at this time.

## 2023-02-07 ENCOUNTER — Ambulatory Visit: Payer: No Typology Code available for payment source | Admitting: Registered Nurse

## 2023-02-07 ENCOUNTER — Encounter: Payer: Self-pay | Admitting: Registered Nurse

## 2023-02-07 DIAGNOSIS — S61213A Laceration without foreign body of left middle finger without damage to nail, initial encounter: Secondary | ICD-10-CM

## 2023-02-07 NOTE — Progress Notes (Signed)
Subjective:     Patient ID: Kimberly Hall, female   DOB: [birth year], 62 y.o.   MRN: [id number]  61y/o caucasian female established patient bumped [place] glass  with her elbow and she tried to catch before it fell on floor and it broke in her hand.  Came straight to clinic after applying pressure with napkin.  Left middle finger laceration bleeding controlled with pressure and elevation.  Patient denied pieces in wound/feeling or unable to move finger/cold/blue/nail damage.  Denied pain at rest/not swollen.  Unsure of date last tetanus shot but was years ago.     Review of Systems  Constitutional:  Negative for chills, diaphoresis and fever.  HENT:  Negative for trouble swallowing and voice change.   Eyes:  Negative for photophobia and visual disturbance.  Respiratory:  Negative for cough, shortness of breath, wheezing and stridor.   Cardiovascular:  Negative for palpitations.  Gastrointestinal:  Negative for nausea and vomiting.  Musculoskeletal:  Positive for myalgias. Negative for gait problem, joint swelling, neck pain and neck stiffness.  Skin:  Positive for wound. Negative for color change, pallor and rash.  Neurological:  Negative for dizziness, tremors, syncope, weakness, light-headedness, numbness and headaches.  Psychiatric/Behavioral:  Negative for agitation, confusion, self-injury and sleep disturbance.        Objective:   Physical Exam Vitals and nursing note reviewed.  Constitutional:      General: She is awake. She is not in acute distress.    Appearance: Normal appearance. She is well-developed, well-groomed and overweight. She is not ill-appearing, toxic-appearing or diaphoretic.  HENT:     Head: Normocephalic and atraumatic.     Jaw: There is normal jaw occlusion.     Salivary Glands: Right salivary gland is not diffusely enlarged. Left salivary gland is not diffusely enlarged.     Right Ear: Hearing and external ear normal.     Left Ear: Hearing and external  ear normal.     Nose: Nose normal. No congestion or rhinorrhea.     Mouth/Throat:     Lips: Pink. No lesions.     Mouth: Mucous membranes are moist. No oral lesions or angioedema.     Dentition: No gum lesions.     Tongue: No lesions. Tongue does not deviate from midline.     Pharynx: Oropharynx is clear. Uvula midline.  Eyes:     General: Lids are normal. Vision grossly intact. Gaze aligned appropriately. Allergic shiner present. No scleral icterus.       Right eye: No discharge.        Left eye: No discharge.     Extraocular Movements: Extraocular movements intact.     Conjunctiva/sclera: Conjunctivae normal.     Pupils: Pupils are equal, round, and reactive to light.  Neck:     Trachea: Trachea and phonation normal. No tracheal deviation.  Cardiovascular:     Rate and Rhythm: Normal rate and regular rhythm.     Pulses:          Radial pulses are 2+ on the right side and 2+ on the left side.  Pulmonary:     Effort: Pulmonary effort is normal.     Breath sounds: Normal breath sounds and air entry. No stridor or transmitted upper airway sounds. No wheezing.     Comments: Spoke full sentences without difficulty; no cough observed in exam room Abdominal:     General: Abdomen is flat.  Musculoskeletal:        General: Normal range of motion.  Right upper arm: No swelling, edema, deformity or lacerations.     Left upper arm: No swelling, edema, deformity or lacerations.     Right elbow: No swelling, deformity, effusion or lacerations. Normal range of motion.     Left elbow: No swelling, deformity, effusion or lacerations. Normal range of motion.     Right forearm: No swelling, edema, deformity, lacerations or tenderness.     Left forearm: No swelling, edema, deformity, lacerations or tenderness.     Right wrist: No swelling, deformity, effusion or lacerations. Normal range of motion.     Left wrist: No swelling, deformity, effusion or lacerations. Normal range of motion.     Right  hand: Normal strength. Normal capillary refill.     Left hand: Normal strength. Normal capillary refill.     Cervical back: Normal range of motion and neck supple. No swelling, edema, deformity, erythema, signs of trauma, lacerations, rigidity, torticollis or crepitus. No pain with movement. Normal range of motion.     Thoracic back: No swelling, edema, deformity, signs of trauma, lacerations or spasms. Normal range of motion.  Lymphadenopathy:     Head:     Right side of head: No submandibular or preauricular adenopathy.     Left side of head: No submandibular or preauricular adenopathy.     Cervical:     Right cervical: No superficial cervical adenopathy.    Left cervical: No superficial cervical adenopathy.  Skin:    General: Skin is warm and dry.     Capillary Refill: Capillary refill takes less than 2 seconds.     Coloration: Skin is not ashen, cyanotic, jaundiced, mottled, pale or sallow.     Findings: Signs of injury and laceration present. No abrasion, abscess, acne, bruising, burn, ecchymosis, erythema, petechiae, rash or wound.     Nails: There is no clubbing.          Comments: Linear 1cm laceration clean edges well approximated posterior  left 3rd finger beefy wound bed oozing capillary bleeding when pressure released from napkin will drip; distal to MCP but proximal to PIP joint  Neurological:     General: No focal deficit present.     Mental Status: She is alert and oriented to person, place, and time. Mental status is at baseline.     GCS: GCS eye subscore is 4. GCS verbal subscore is 5. GCS motor subscore is 6.     Cranial Nerves: Cranial nerves 2-12 are intact. No cranial nerve deficit, dysarthria or facial asymmetry.     Sensory: Sensation is intact.     Motor: Motor function is intact. No weakness, tremor, atrophy, abnormal muscle tone or seizure activity.     Coordination: Coordination is intact. Coordination normal.     Gait: Gait is intact. Gait normal.      Comments: In/out of chair without difficulty; gait sure and steady in clinic; bilateral hand grasp equal 5/5  Psychiatric:        Attention and Perception: Attention and perception normal.        Mood and Affect: Mood and affect normal.        Speech: Speech normal.        Behavior: Behavior normal. Behavior is cooperative.        Thought Content: Thought content normal.        Cognition and Memory: Cognition and memory normal.        Judgment: Judgment normal.    Denied pain at this time or throbbing of  initial presentation  Elevated and held pressure 5 minutes prior to washing with soap and water. Washed with soap and water dried and 3 steristrips applied telfa gauze applied and secured with cobain adhesive bandage.  Patient given 5 telfa gauze, roll of cobain for dressing change prn see RN Kimrey tomorrow for re-evaluation.  Injury report received electronically and supervisor sent to Safety Officer also after clinic appt.    Assessment:     Finger left middle initial no foreign body or nail damage    Plan:    Patient was instructed to not put left hand in dirty water until laceration healed.  May trim ends of steri strips if they stick up with scissors or nail clippers.  See RN Evlyn Kanner tomorrow for recheck.  Tetanus booster today last greater than 10 years ago RN Kimrey notified per [place] last in 1990s.  Patient asked if glass cut does she really need tetanus discussed recommended booster every 10 years.  Given VIS tdap..  No Td/Tdap immunizations noted in paper chart or Epic on review.  Applied 3 steristrps, gauze and cobain.  Patient notified to contact supervisor to complete injury report.  Do not soak left hand in pool, lake, hot tub, dirty sink water. May shower apply neosporin/triple antibiotic with daily dressing change.  Given gauze and cobain for tomorrow/prn dressing changes.  EBL 90m change at least daily after washing with soap and water.  Change prn soiling. [facility]care handout on  laceration and tdap vaccine printed and given to patient. Medications as directed. Tylenol '1000mg'$  po q6h prn pain.  May ice and elevated if throbbing.  Call or return to clinic as needed if these symptoms worsen or fail to improve as anticipated. Patient verbalized agreement and understanding of treatment plan and had no further questions at this time. P2: ROM, injury prevention

## 2023-02-07 NOTE — Patient Instructions (Signed)
Tdap (Tetanus, Diphtheria, Pertussis) Vaccine: What You Need to Know 1. Why get vaccinated? Tdap vaccine can prevent tetanus, diphtheria, and pertussis. Diphtheria and pertussis spread from person to person. Tetanus enters the body through cuts or wounds. TETANUS (T) causes painful stiffening of the muscles. Tetanus can lead to serious health problems, including being unable to open the mouth, having trouble swallowing and breathing, or death. DIPHTHERIA (D) can lead to difficulty breathing, heart failure, paralysis, or death. PERTUSSIS (aP), also known as "whooping cough," can cause uncontrollable, violent coughing that makes it hard to breathe, eat, or drink. Pertussis can be extremely serious especially in babies and young children, causing pneumonia, convulsions, brain damage, or death. In teens and adults, it can cause weight loss, loss of bladder control, passing out, and rib fractures from severe coughing. 2. Tdap vaccine Tdap is only for children 7 years and older, adolescents, and adults.  Adolescents should receive a single dose of Tdap, preferably at age 58 or 62 years. Pregnant people should get a dose of Tdap during every pregnancy, preferably during the early part of the third trimester, to help protect the newborn from pertussis. Infants are most at risk for severe, life-threatening complications from pertussis. Adults who have never received Tdap should get a dose of Tdap. Also, adults should receive a booster dose of either Tdap or Td (a different vaccine that protects against tetanus and diphtheria but not pertussis) every 10 years, or after 5 years in the case of a severe or dirty wound or burn. Tdap may be given at the same time as other vaccines. 3. Talk with your health care provider Tell your vaccine provider if the person getting the vaccine: Has had an allergic reaction after a previous dose of any vaccine that protects against tetanus, diphtheria, or pertussis, or has any  severe, life-threatening allergies Has had a coma, decreased level of consciousness, or prolonged seizures within 7 days after a previous dose of any pertussis vaccine (DTP, DTaP, or Tdap) Has seizures or another nervous system problem Has ever had Guillain-Barr Syndrome (also called "GBS") Has had severe pain or swelling after a previous dose of any vaccine that protects against tetanus or diphtheria In some cases, your health care provider may decide to postpone Tdap vaccination until a future visit. People with minor illnesses, such as a cold, may be vaccinated. People who are moderately or severely ill should usually wait until they recover before getting Tdap vaccine.  Your health care provider can give you more information. 4. Risks of a vaccine reaction Pain, redness, or swelling where the shot was given, mild fever, headache, feeling tired, and nausea, vomiting, diarrhea, or stomachache sometimes happen after Tdap vaccination. People sometimes faint after medical procedures, including vaccination. Tell your provider if you feel dizzy or have vision changes or ringing in the ears.  As with any medicine, there is a very remote chance of a vaccine causing a severe allergic reaction, other serious injury, or death. 5. What if there is a serious problem? An allergic reaction could occur after the vaccinated person leaves the clinic. If you see signs of a severe allergic reaction (hives, swelling of the face and throat, difficulty breathing, a fast heartbeat, dizziness, or weakness), call 9-1-1 and get the person to the nearest hospital. For other signs that concern you, call your health care provider.  Adverse reactions should be reported to the Vaccine Adverse Event Reporting System (VAERS). Your health care provider will usually file this report, or you  can do it yourself. Visit the VAERS website at www.vaers.SamedayNews.es or call (949) 693-2702. VAERS is only for reporting reactions, and VAERS staff  members do not give medical advice. 6. The National Vaccine Injury Compensation Program The [employer] Vaccine Injury Compensation Program (VICP) is a federal program that was created to compensate people who may have been injured by certain vaccines. Claims regarding alleged injury or death due to vaccination have a time limit for filing, which may be as short as two years. Visit the VICP website at GoldCloset.com.ee or call 803 354 4403 to learn about the program and about filing a claim. 7. How can I learn more? Ask your health care provider. Call your local or state health department. Visit the website of the Food and Drug Administration (FDA) for vaccine package inserts and additional information at TraderRating.uy. Contact the Centers for Disease Control and Prevention (CDC): Call 802-454-4296 (1-800-CDC-INFO) or Visit CDC's website at http://hunter.com/. Source: CDC Vaccine Information Statement Tdap (Tetanus, Diphtheria, Pertussis) Vaccine (07/03/2020) This same material is available at http://www.wolf.info/ for no charge. This information is not intended to replace advice given to you by your health care provider. Make sure you discuss any questions you have with your health care provider. Document Revised: 08/24/2022 Document Reviewed: 08/24/2022 Elsevier Patient Education  [place], Adult A laceration is a cut that may go through all layers of the skin and into the tissue that is right under the skin. Some lacerations heal on their own. Others need to be closed with stitches (sutures), staples, skin adhesive strips, or skin glue. Proper care of a laceration reduces the risk for infection, helps the laceration heal better, and may prevent scarring. General tips Keep the wound clean and dry. Do not scratch or pick at the wound. Wash your hands with soap and water for at least 20 seconds before and after touching your  wound or changing your bandage (dressing). If soap and water are not available, use hand sanitizer. Do not usedisinfectants or antiseptics, such as rubbing alcohol, to clean your wound unless told by your health care provider. If you were given a dressing, you should change it at least once a day, or as told by your health care provider. You should also change it if it becomes wet or dirty. How to care for your laceration If sutures or staples were used: Keep the wound completely dry for the first 24 hours, or as told by your health care provider. After that time, you may shower or bathe. Do not soak your wound in water until after the sutures or staples have been removed. Clean the wound once each day, or as told by your health care provider. To do this: Wash the wound with soap and water. Rinse the wound with water to remove all soap. Pat the wound dry with a clean towel. Do not rub the wound. After cleaning the wound, apply a thin layer of antibiotic ointment, other topical ointments, or a non-adherent dressing as told by your health care provider. This will help prevent infection and keep the dressing from sticking to the wound. Have the sutures or staples removed as told by your health care provider. Do not  remove sutures or staples yourself. If skin adhesive strips were used: Do not get the skin adhesive strips wet. You may shower or bathe, but keep the wound dry. If the wound gets wet, pat it dry with a clean towel. Do not rub the wound. Skin adhesive strips fall off on their  own. If adhesive strip edges start to loosen and curl up, you may trim the loose edges. Do not remove adhesive strips completely unless your health care provider tells you to do that. If skin glue was used: You may shower or bathe, but try to keep the wound dry. Do not soak the wound in water. After showering or bathing, pat the wound dry with a clean towel. Do not rub the wound. Do not do any activities that will  make you sweat a lot until the skin glue has fallen off. Do not apply liquid, cream, or ointment medicine to the wound while the skin glue is in place. Doing this may loosen the film before the wound has healed. If a dressing is placed over the wound, do not apply tape directly over the skin glue. Doing this may cause the glue to be pulled off before the wound has healed. Do not pick at the glue. Skin glue usually remains in place for 5-10 days and then falls off the skin. Follow these instructions at home: Medicines Take over-the-counter and prescription medicines only as told by your health care provider. If you were prescribed an antibiotic medicine or ointment, take or apply it as told by your health care provider. Do not stop using it even if your condition improves. Managing pain and swelling If directed, put ice on the injured area. To do this: Put ice in a plastic bag. Place a towel between your skin and the bag. Leave the ice on for 20 minutes, 2-3 times a day. Remove the ice if your skin turns bright red. This is very important. If you cannot feel pain, heat, or cold, you have a greater risk of damage to the area. Raise (elevate) the injured area above the level of your heart while you are sitting or lying down for the first 24-48 hours after the laceration is repaired. General instructions  Avoid any activity that could cause your wound to reopen. Check your wound every day for signs of infection. Watch for: More redness, swelling, or pain. Fluid or blood. Warmth. Pus or a bad smell. Keep all follow-up visits. This is important. Contact a health care provider if: You received a tetanus shot and you have swelling, severe pain, redness, or bleeding at the injection site. Your closed wound breaks open. You have any of these signs of infection: More redness, swelling, or pain around your wound. Fluid or blood coming from your wound. Warmth coming from your wound. Pus or a bad  smell coming from your wound. A fever. You notice something coming out of the wound, such as wood or glass. Your pain is not controlled with medicine. You notice a change in the color of your skin near your wound. You need to change the dressing often. You develop a new rash. You have numbness around the wound. Get help right away if: You develop severe swelling around the wound. Your pain suddenly increases and is severe. You develop painful lumps near the wound or on skin anywhere else on your body. You have a red streak going away from your wound. The wound is on your hand or foot, and you cannot properly move a finger or toe. The wound is on your hand or foot, and you notice that your fingers or toes look pale or bluish. Summary A laceration is a cut that may go through all layers of the skin and into the tissue that is right under the skin. Some lacerations heal on  their own. Others need to be closed with stitches (sutures), staples, skin adhesive strips, or skin glue. Proper care of a laceration reduces the risk of infection, helps the laceration heal better, and may prevent scarring. This information is not intended to replace advice given to you by your health care provider. Make sure you discuss any questions you have with your health care provider. Document Revised: 01/21/2021 Document Reviewed: 01/21/2021 Elsevier Patient Education  [place].

## 2023-02-08 ENCOUNTER — Ambulatory Visit: Payer: No Typology Code available for payment source | Admitting: Occupational Medicine

## 2023-02-08 DIAGNOSIS — E559 Vitamin D deficiency, unspecified: Secondary | ICD-10-CM

## 2023-02-08 DIAGNOSIS — R7303 Prediabetes: Secondary | ICD-10-CM

## 2023-02-08 NOTE — Progress Notes (Signed)
Lab drawn from Left AC tolerated well no issues noted. Finger laceration clean steri strips in place no signs of infection. Applied new non adherent dressing and wrapped in coban.

## 2023-02-09 ENCOUNTER — Other Ambulatory Visit: Payer: Self-pay | Admitting: Registered Nurse

## 2023-02-09 ENCOUNTER — Ambulatory Visit: Payer: No Typology Code available for payment source | Admitting: Occupational Medicine

## 2023-02-09 DIAGNOSIS — Z0000 Encounter for general adult medical examination without abnormal findings: Secondary | ICD-10-CM

## 2023-02-09 DIAGNOSIS — R7303 Prediabetes: Secondary | ICD-10-CM

## 2023-02-09 DIAGNOSIS — E559 Vitamin D deficiency, unspecified: Secondary | ICD-10-CM

## 2023-02-09 LAB — VITAMIN D 25 HYDROXY (VIT D DEFICIENCY, FRACTURES): Vit D, 25-Hydroxy: 18.5 ng/mL — ABNORMAL LOW (ref 30.0–100.0)

## 2023-02-09 LAB — HEMOGLOBIN A1C
Est. average glucose Bld gHb Est-mCnc: 114 mg/dL
Hgb A1c MFr Bld: 5.6 % (ref 4.8–5.6)

## 2023-02-09 MED ORDER — CHOLECALCIFEROL 1.25 MG (50000 UT) PO TABS: 1.0000 | ORAL_TABLET | ORAL | 2 refills | Status: AC

## 2023-02-09 NOTE — Progress Notes (Signed)
Vitamin D decreased again if she has not been taking 50,000 units weekly restart Rx sent to her pharmacy.  Take with fattiest meal of the day.  Recheck level in 6-12 months.  Hgba1c normal but high normal   NP recommend increasing water and fiber (whole grains/fruits and vegetables) in diet and decreasing processed foods/added sugars.  Keep added sugars  less than 25 grams per day 5 teaspoons; activity 150 minutes per week; avoid dehydration and drink water to keep urine pale yellow clear and voiding every 2-4 hours while awake.  Link to dietitian to book free appt [facility] ([email or web address]). emailed link to patient. Recheck level in 1 year.  Consider 5-10lb weight loss this year.

## 2023-02-14 ENCOUNTER — Ambulatory Visit: Payer: No Typology Code available for payment source | Admitting: Occupational Medicine

## 2023-02-14 DIAGNOSIS — S61213A Laceration without foreign body of left middle finger without damage to nail, initial encounter: Secondary | ICD-10-CM

## 2023-02-14 NOTE — Progress Notes (Signed)
Cleaned wound. Healing well wound closed. Cleaned with antiseptic, applied triple abt and Band-Aid. Pt reports pain with movement. Educated if continues will schedule appt with NP.

## 2023-02-16 ENCOUNTER — Ambulatory Visit: Payer: No Typology Code available for payment source | Admitting: Registered Nurse

## 2023-02-16 ENCOUNTER — Encounter: Payer: Self-pay | Admitting: Registered Nurse

## 2023-02-16 VITALS — BP 130/80 | HR 66 | Resp 16

## 2023-02-16 DIAGNOSIS — J3489 Other specified disorders of nose and nasal sinuses: Secondary | ICD-10-CM

## 2023-02-16 DIAGNOSIS — Z0000 Encounter for general adult medical examination without abnormal findings: Secondary | ICD-10-CM

## 2023-02-16 DIAGNOSIS — S61213D Laceration without foreign body of left middle finger without damage to nail, subsequent encounter: Secondary | ICD-10-CM

## 2023-02-16 MED ORDER — LORATADINE 10 MG PO TABS: 10.0000 mg | ORAL_TABLET | Freq: Every day | ORAL | 0 refills | Status: DC

## 2023-02-16 MED ORDER — SALINE SPRAY 0.65 % NA SOLN: 2.0000 | NASAL | 0 refills | Status: DC

## 2023-02-16 NOTE — Patient Instructions (Addendum)
Laceration Care, Adult A laceration is a cut that may go through all layers of the skin and into the tissue that is right under the skin. Some lacerations heal on their own. Others need to be closed with stitches (sutures), staples, skin adhesive strips, or skin glue. Proper care of a laceration reduces the risk for infection, helps the laceration heal better, and may prevent scarring. General tips Keep the wound clean and dry. Do not scratch or pick at the wound. Wash your hands with soap and water for at least 20 seconds before and after touching your wound or changing your bandage (dressing). If soap and water are not available, use hand sanitizer. Do not usedisinfectants or antiseptics, such as rubbing alcohol, to clean your wound unless told by your health care provider. If you were given a dressing, you should change it at least once a day, or as told by your health care provider. You should also change it if it becomes wet or dirty. How to care for your laceration If sutures or staples were used: Keep the wound completely dry for the first 24 hours, or as told by your health care provider. After that time, you may shower or bathe. Do not soak your wound in water until after the sutures or staples have been removed. Clean the wound once each day, or as told by your health care provider. To do this: Wash the wound with soap and water. Rinse the wound with water to remove all soap. Pat the wound dry with a clean towel. Do not rub the wound. After cleaning the wound, apply a thin layer of antibiotic ointment, other topical ointments, or a non-adherent dressing as told by your health care provider. This will help prevent infection and keep the dressing from sticking to the wound. Have the sutures or staples removed as told by your health care provider. Do not  remove sutures or staples yourself. If skin adhesive strips were used: Do not get the skin adhesive strips wet. You may shower or bathe,  but keep the wound dry. If the wound gets wet, pat it dry with a clean towel. Do not rub the wound. Skin adhesive strips fall off on their own. If adhesive strip edges start to loosen and curl up, you may trim the loose edges. Do not remove adhesive strips completely unless your health care provider tells you to do that. If skin glue was used: You may shower or bathe, but try to keep the wound dry. Do not soak the wound in water. After showering or bathing, pat the wound dry with a clean towel. Do not rub the wound. Do not do any activities that will make you sweat a lot until the skin glue has fallen off. Do not apply liquid, cream, or ointment medicine to the wound while the skin glue is in place. Doing this may loosen the film before the wound has healed. If a dressing is placed over the wound, do not apply tape directly over the skin glue. Doing this may cause the glue to be pulled off before the wound has healed. Do not pick at the glue. Skin glue usually remains in place for 5-10 days and then falls off the skin. Follow these instructions at home: Medicines Take over-the-counter and prescription medicines only as told by your health care provider. If you were prescribed an antibiotic medicine or ointment, take or apply it as told by your health care provider. Do not stop using it even if   your condition improves. Managing pain and swelling If directed, put ice on the injured area. To do this: Put ice in a plastic bag. Place a towel between your skin and the bag. Leave the ice on for 20 minutes, 2-3 times a day. Remove the ice if your skin turns bright red. This is very important. If you cannot feel pain, heat, or cold, you have a greater risk of damage to the area. Raise (elevate) the injured area above the level of your heart while you are sitting or lying down for the first 24-48 hours after the laceration is repaired. General instructions  Avoid any activity that could cause your wound  to reopen. Check your wound every day for signs of infection. Watch for: More redness, swelling, or pain. Fluid or blood. Warmth. Pus or a bad smell. Keep all follow-up visits. This is important. Contact a health care provider if: You received a tetanus shot and you have swelling, severe pain, redness, or bleeding at the injection site. Your closed wound breaks open. You have any of these signs of infection: More redness, swelling, or pain around your wound. Fluid or blood coming from your wound. Warmth coming from your wound. Pus or a bad smell coming from your wound. A fever. You notice something coming out of the wound, such as wood or glass. Your pain is not controlled with medicine. You notice a change in the color of your skin near your wound. You need to change the dressing often. You develop a new rash. You have numbness around the wound. Get help right away if: You develop severe swelling around the wound. Your pain suddenly increases and is severe. You develop painful lumps near the wound or on skin anywhere else on your body. You have a red streak going away from your wound. The wound is on your hand or foot, and you cannot properly move a finger or toe. The wound is on your hand or foot, and you notice that your fingers or toes look pale or bluish. Summary A laceration is a cut that may go through all layers of the skin and into the tissue that is right under the skin. Some lacerations heal on their own. Others need to be closed with stitches (sutures), staples, skin adhesive strips, or skin glue. Proper care of a laceration reduces the risk of infection, helps the laceration heal better, and may prevent scarring. This information is not intended to replace advice given to you by your health care provider. Make sure you discuss any questions you have with your health care provider. Document Revised: 01/21/2021 Document Reviewed: 01/21/2021 Elsevier Patient Education   [place]. How to Perform a Sinus Rinse A sinus rinse is a home treatment that is used to rinse your sinuses with a germ-free (sterile) mixture of salt and water (saline solution). Sinuses are air-filled spaces in your skull that are behind the bones of your face and forehead. They open into your nasal cavity. A sinus rinse can help to clear mucus, dirt, dust, or pollen from your nasal cavity. You may do a sinus rinse when you have a cold, a virus, nasal allergy symptoms, a sinus infection, or stuffiness in your nose or sinuses. What are the risks? A sinus rinse is generally safe and effective. However, there are a few risks, which include: A burning sensation in your sinuses. This may happen if you do not make the saline solution as directed. Be sure to follow all directions when making  the saline solution. Nasal irritation. Infection. This may be from unclean supplies or from contaminated water. Infection from contaminated water is rare, but possible. Do not do a sinus rinse if you have had ear or nasal surgery, ear infection, or plugged ears, unless recommended by your health care provider. Supplies needed: Saline solution or powder. Distilled or sterile water to mix with saline powder. You may use boiled and cooled tap water. Boil tap water for 5 minutes; cool until it is lukewarm. Use within 24 hours. Do not use regular tap water to mix with the saline solution. Neti pot or nasal rinse bottle. These supplies release the saline solution into your nose and through your sinuses. Neti pots and nasal rinse bottles can be purchased at [employer], a health food store, or online. How to perform a sinus rinse  Wash your hands with soap and water for at least 20 seconds. If soap and water are not available, use hand sanitizer. Wash your device according to the directions that came with the product and then dry it. Use the solution that comes with your product or one that is sold  separately in stores. Follow the mixing directions on the package to mix with sterile or distilled water. Fill the device with the amount of saline solution noted in the device instructions. Stand by a sink and tilt your head sideways over the sink. Place the spout of the device in your upper nostril (the one closer to the ceiling). Gently pour or squeeze the saline solution into your nasal cavity. The liquid should drain out from the lower nostril if you are not too congested. While rinsing, breathe through your open mouth. Gently blow your nose to clear any mucus and rinse solution. Blowing too hard may cause ear pain. Turn your head in the other direction and repeat in your other nostril. Clean and rinse your device with clean water and then air-dry it. Talk with your health care provider or pharmacist if you have questions about how to do a sinus rinse. Summary A sinus rinse is a home treatment that is used to rinse your sinuses with a sterile mixture of salt and water (saline solution). You may do a sinus rinse when you have a cold, a virus, nasal allergy symptoms, a sinus infection, or stuffiness in your nose or sinuses. A sinus rinse is generally safe and effective. Follow all instructions carefully. This information is not intended to replace advice given to you by your health care provider. Make sure you discuss any questions you have with your health care provider. Document Revised: 05/03/2021 Document Reviewed: 05/03/2021 Elsevier Patient Education  [place]. Sinus Pain  Sinus pain may occur when your sinuses become clogged or swollen. Sinuses are air-filled spaces in your skull that are behind the bones of your face and forehead. Sinus pain can range from mild to severe. What are the causes? Sinus pain can result from various conditions that affect the sinuses. Common causes include: Colds. Sinus infections. Allergies. What are the signs or symptoms? The main symptom  of this condition is pain or pressure in your face, forehead, ears, or upper teeth. People who have sinus pain often have other symptoms, such as: Congested or runny nose. Fever. Inability to smell. Headache. Weather changes can make symptoms worse. How is this diagnosed? Your health care provider will diagnose this condition based on your symptoms and a physical exam. If you have pain that keeps coming back or does not go  away, your health care provider may recommend more testing. This may include: Imaging tests, such as a CT scan or MRI, to check for problems with your sinuses. Examination of your sinuses using a thin tool with a camera that is inserted through your nose (endoscopy). How is this treated? Treatment for this condition depends on the cause. Sinus pain that is caused by a sinus infection may be treated with antibiotic medicine. Sinus pain that is caused by congestion may be helped by rinsing out (flushing) the nose and sinuses with saline solution. Sinus pain that is caused by allergies may be helped by allergy medicines (antihistamines) and medicated nasal sprays. Sinus surgery may be needed in some cases if other treatments do not help. Follow these instructions at home: General instructions If directed: Apply a warm, moist washcloth to your face to help relieve pain. Use a nasal saline wash. Follow the directions on the bottle or box. Hydrate and humidify Drink enough water to keep your urine clear or pale yellow. Staying hydrated will help to thin your mucus. Use a humidifier if your home is dry. Inhale steam for 10-15 minutes, 3-4 times a day or as told by your health care provider. You can do this in the bathroom while a hot shower is running. Limit your exposure to cool or dry air. Medicines  Take over-the-counter and prescription medicines only as told by your health care provider. If you were prescribed an antibiotic medicine, take it as told by your health care  provider. Do not stop taking the antibiotic even if you start to feel better. If you have congestion, use a nasal spray to help lessen pressure. Contact a health care provider if: You have sinus pain more than one time a week. You have sensitivity to light or sound. You develop a fever. You feel nauseous or you vomit. Your sinus pain or headache does not get better with treatment. Get help right away if: You have vision problems. You have sudden, severe pain in your face or head. You have a seizure. You are confused. You have a stiff neck. Summary Sinus pain occurs when your sinuses become clogged or swollen. Sinus pain can result from various conditions that affect the sinuses, such as a cold, a sinus infection, or an allergy. Treatment for this condition depends on the cause. It may include medicine, such as antibiotics or antihistamines. This information is not intended to replace advice given to you by your health care provider. Make sure you discuss any questions you have with your health care provider. Document Revised: 10/17/2021 Document Reviewed: 10/17/2021 Elsevier Patient Education  [place]. Preventing Hypertension Hypertension, also called high blood pressure, is when the force of blood pumping through the arteries is too strong. Arteries are blood vessels that carry blood from the heart throughout the body. Often, hypertension does not cause symptoms until blood pressure is very high. It is important to have your blood pressure checked regularly. Diet and lifestyle changes can help you prevent hypertension, and they may make you feel better overall and improve your quality of life. If you already have hypertension, you may control it with diet and lifestyle changes, as well as with medicine. How can this condition affect me? Over time, hypertension can damage the arteries and decrease blood flow to important parts of the body, including the brain, heart, and kidneys.  By keeping your blood pressure in a healthy range, you can help prevent complications like heart attack, heart failure, stroke, kidney  failure, and vascular dementia. What can increase my risk? An unhealthy diet and a lack of physical activity can make you more likely to develop high blood pressure. Some other risk factors include: Age. The risk increases with age. Having family members who have had high blood pressure. Having certain health conditions, such as thyroid problems. Being overweight or obese. Drinking too much alcohol or caffeine. Having too much fat, sugar, calories, or salt (sodium) in your diet. Smoking or using illegal drugs. Taking certain medicines, such as antidepressants, decongestants, birth control pills, and NSAIDs, such as ibuprofen. What actions can I take to prevent or manage this condition? Work with your health care provider to make a hypertension prevention plan that works for you. You may be referred for counseling on a healthy diet and physical activity. Follow your plan and keep all follow-up visits. Diet changes Maintain a healthy diet. This includes: Eating less salt (sodium). Ask your health care provider how much sodium is safe for you to have. The general recommendation is to have less than 1 tsp (2,300 mg) of sodium a day. Do not add salt to your food. Choose low-sodium options when grocery shopping and eating out. Limiting fats in your diet. You can do this by eating low-fat or fat-free dairy products and by eating less red meat. Eating more fruits, vegetables, and whole grains. Make a goal to eat: 1-2 cups of fresh fruits and vegetables each day. 3-4 servings of whole grains each day. Avoiding foods and beverages that have added sugars. Eating fish that contain healthy fats (omega-3 fatty acids), such as mackerel or salmon. If you need help putting together a healthy eating plan, try the DASH diet. This diet is high in fruits, vegetables, and whole  grains. It is low in sodium, red meat, and added sugars. DASH stands for Dietary Approaches to Stop Hypertension. Lifestyle changes  Lose weight if you are overweight. Losing just 3-5% of your body weight can help prevent or control hypertension. For example, if your present weight is 200 lb (91 kg), a loss of 3-5% of your weight means losing 6-10 lb (2.7-4.5 kg). Ask your health care provider to help you with a diet and exercise plan to safely lose weight. Get enough exercise. Do at least 150 minutes of moderate-intensity exercise each week. You could do this in short exercise sessions several times a day, or you could do longer exercise sessions a few times a week. For example, you could take a brisk 10-minute walk or bike ride, 3 times a day, for 5 days a week. Find ways to reduce stress, such as exercising, meditating, listening to music, or taking a yoga class. If you need help reducing stress, ask your health care provider. Do not use any products that contain nicotine or tobacco. These products include cigarettes, chewing tobacco, and vaping devices, such as e-cigarettes. Chemicals in tobacco and nicotine products raise your blood pressure each time you use them. If you need help quitting, ask your health care provider. Learn how to check your blood pressure at home. Make sure that you know your personal target blood pressure, as told by your health care provider. Try to sleep 7-9 hours per night. Alcohol use Do not drink alcohol if: Your health care provider tells you not to drink. You are pregnant, may be pregnant, or are planning to become pregnant. If you drink alcohol: Limit how much you have to: 0-1 drink a day for women. 0-2 drinks a day for men. Know  how much alcohol is in your drink. In the [place] one drink equals one 12 oz bottle of beer (355 mL), one 5 oz glass of wine (148 mL), or one 1 oz glass of hard liquor (44 mL). Medicines In addition to diet and lifestyle changes, your  health care provider may recommend medicines to help lower your blood pressure. In general: You may need to try a few different medicines to find what works best for you. You may need to take more than one medicine. Take over-the-counter and prescription medicines only as told by your health care provider. Questions to ask your health care provider What is my blood pressure goal? How can I lower my risk for high blood pressure? How should I monitor my blood pressure at home? Where to find support Your health care provider can help you prevent hypertension and help you keep your blood pressure at a healthy level. Your local hospital or your community may also provide support services and prevention programs. The American Heart Association offers an online support network at supportnetwork.heart.org Where to find more information Learn more about hypertension from: [place], Lung, and [place]: https://wilson-eaton.com/ Centers for Disease Control and Prevention: http://www.wolf.info/ American Academy of Family Physicians: familydoctor.org Learn more about the DASH diet from: [place], Lung, and [place]: https://wilson-eaton.com/ Contact a health care provider if: You think you are having a reaction to medicines you have taken. You have recurrent headaches or feel dizzy. You have swelling in your ankles. You have trouble with your vision. Get help right away if: You have sudden, severe chest, back, or abdominal pain or discomfort. You have shortness of breath. You have a sudden, severe headache. These symptoms may be an emergency. Get help right away. Call 911. Do not wait to see if the symptoms will go away. Do not drive yourself to the hospital. Summary Hypertension often does not cause any symptoms until blood pressure is very high. It is important to get your blood pressure checked regularly. Diet and lifestyle changes are important steps in preventing hypertension. By keeping  your blood pressure in a healthy range, you may prevent complications like heart attack, heart failure, stroke, and kidney failure. Work with your health care provider to make a hypertension prevention plan that works for you. This information is not intended to replace advice given to you by your health care provider. Make sure you discuss any questions you have with your health care provider. Document Revised: 09/02/2021 Document Reviewed: 09/02/2021 Elsevier Patient Education  [place].

## 2023-02-16 NOTE — Progress Notes (Signed)
Subjective:    Patient ID: Kimberly Hall, female    DOB: [birth year], 62 y.o.   MRN: [id number]  61y/o caucasian female established patient here for evaluation finger pain s/p laceration.  Most painful with flexion typically not hurting at rest.  Has noticed am headache recently checking BP at home 120-130s/70-80s.  Typically will drink some water and will improve/resolve.  Stated urinating frequently pale yellow urine as she has good water intake.        Review of Systems     Objective:   Physical Exam    Normal Hgba1c  Latest Reference Range & Units 04/29/22 10:15 02/08/23 12:20  Sodium 134 - 144 mmol/L 139   Potassium 3.5 - 5.2 mmol/L 4.0   Chloride 96 - 106 mmol/L 102   Glucose 70 - 99 mg/dL 101 (H)   BUN 8 - 27 mg/dL 14   Creatinine 0.57 - 1.00 mg/dL 0.64   Calcium 8.7 - 10.3 mg/dL 8.7   BUN/Creatinine Ratio 12 - 28  22   eGFR >59 mL/min/1.73 100   Phosphorus 3.0 - 4.3 mg/dL 2.9 (L)   Magnesium 1.6 - 2.3 mg/dL 1.9   Alkaline Phosphatase 44 - 121 IU/L 77   Albumin 3.8 - 4.8 g/dL 4.1   Albumin/Globulin Ratio 1.2 - 2.2  1.7   Uric Acid 3.0 - 7.2 mg/dL 3.5   AST 0 - 40 IU/L 19   ALT 0 - 32 IU/L 14   Total Protein 6.0 - 8.5 g/dL 6.5   Total Bilirubin 0.0 - 1.2 mg/dL 0.5   GGT 0 - 60 IU/L 15   Estimated CHD Risk 0.0 - 1.0 times avg. 0.5   LDH 119 - 226 IU/L 205   Total CHOL/HDL Ratio 0.0 - 4.4 ratio 3.4   Cholesterol, Total 100 - 199 mg/dL 231 (H)   HDL Cholesterol >39 mg/dL 67   Triglycerides 0 - 149 mg/dL 101   VLDL Cholesterol Cal 5 - 40 mg/dL 18   LDL Chol Calc (NIH) 0 - 99 mg/dL 146 (H)   Iron 27 - 139 ug/dL 92   Vitamin D, 25-Hydroxy 30.0 - 100.0 ng/mL 25.4 (L) 18.5 (L)  Globulin, Total 1.5 - 4.5 g/dL 2.4   WBC 3.4 - 10.8 x10E3/uL 5.6   RBC 3.77 - 5.28 x10E6/uL 3.93   Hemoglobin 11.1 - 15.9 g/dL 12.2   HCT 34.0 - 46.6 % 36.0   MCV 79 - 97 fL 92   MCH 26.6 - 33.0 pg 31.0   MCHC 31.5 - 35.7 g/dL 33.9   RDW 11.7 - 15.4 % 13.0   Platelets 150 - 450  x10E3/uL 218   Neutrophils Not Estab. % 63   Immature Granulocytes Not Estab. % 0   NEUT# 1.4 - 7.0 x10E3/uL 3.5   Lymphocyte # 0.7 - 3.1 x10E3/uL 1.6   Monocytes Absolute 0.1 - 0.9 x10E3/uL 0.4   Basophils Absolute 0.0 - 0.2 x10E3/uL 0.0   Immature Grans (Abs) 0.0 - 0.1 x10E3/uL 0.0   Lymphs Not Estab. % 29   Monocytes Not Estab. % 6   Basos Not Estab. % 1   Eos Not Estab. % 1   EOS (ABSOLUTE) 0.0 - 0.4 x10E3/uL 0.1   Hemoglobin A1C 4.8 - 5.6 % 5.5 5.6  Est. average glucose Bld gHb Est-mCnc mg/dL  114  TSH 0.450 - 4.500 uIU/mL 2.650   Thyroxine (T4) 4.5 - 12.0 ug/dL 7.3   Free Thyroxine Index 1.2 - 4.9  1.8   T3  Uptake Ratio 24 - 39 % 24   (H): Data is abnormally high (L): Data is abnormally low    Assessment & Plan:  A-frontal headache recurrent, finger laceration subsequent visit  P-Be well 2025 appt Hgba1c and BP met Be Well requirements to see RN Kimrey to complete paperwork.  Next labs due Sep/Oct Vitamin D executive panel.ordered.  Discussed shingrix should be 4 weeks after tdap reschedule with her [facility] office per [facility] website.  Finger laceration healing normally no infection and has full arom slow.  Has been wrapped in cobain and not flexing as much after injury became stiff.  Discussed gentle daily arom may be easier in shower with heat slowly work AROM as laceration still healing fibers.  May use tylenol 1000mg  po q6h prn pain or ibuprofen 800mg  po q8h prn pain take with food OTC.  Do not force extension/flexion of affected finger.  RTC if worsening pain/redness/swelling.exitcare handout laceration care.  Discussed headache that resolves with water is probably mild dehydration ensure drinking enough water to keep urine pale yellow clear and voiding every 2-4 hours while awake.  Nasal congestion/post nasal drip (spring allergies) may be contributing to frontal headache.  Start claritin 10mg  po daily given 4 UD trial from clinic stock and nasal saline 2 sprays each nostril q2h  prn congestion.  Continue blood pressure checks at home.  Patient stated not taking amlodipine ordered by her PCM.  Using garlic supplement instead, exercising and trying to lose weight.  Stressors at work.  Asked if she notices headache when stressed also and she stated may be related.  If headache at work not resolving with water see clinic staff for BP check/evaluation.  Patient will buy more claritin from [employer] if trial helps with headaches. Will continue to monitor BP at clinic appts goal less than 130/80.  Exitcare handouts on preventing hypertension/sinus pain. Patient verbalized understanding information/instructions, agreed with plan of care and had no further questions at this time.

## 2023-02-21 ENCOUNTER — Ambulatory Visit: Payer: No Typology Code available for payment source | Admitting: Occupational Medicine

## 2023-02-21 DIAGNOSIS — R7303 Prediabetes: Secondary | ICD-10-CM

## 2023-02-21 DIAGNOSIS — Z0000 Encounter for general adult medical examination without abnormal findings: Secondary | ICD-10-CM

## 2023-02-21 DIAGNOSIS — E559 Vitamin D deficiency, unspecified: Secondary | ICD-10-CM

## 2023-02-21 NOTE — Progress Notes (Signed)
Be well insurance premium discount evaluation:    Patient completed PCM office visit epic reviewed by RN Kimrey and transcribed. Labs  Tobacco attestation signed. [facility] ROI formed signed. Forms placed in the chart.   Patient given handouts for [facility] pharmacies and discount drugs list,MyChart, Tele doc setup, Tele doc Behavioral, [place] counseling and [facility].  What to do for infectious illness protocol. Given handout for list of medications that can be filled at [facility]. Given Clinic hours and Clinic Email.  

## 2023-06-12 ENCOUNTER — Encounter: Payer: Self-pay | Admitting: Registered Nurse

## 2023-06-12 ENCOUNTER — Telehealth: Payer: Self-pay | Admitting: Registered Nurse

## 2023-06-12 DIAGNOSIS — J069 Acute upper respiratory infection, unspecified: Secondary | ICD-10-CM

## 2023-06-12 MED ORDER — PREDNISONE 10 MG (21) PO TBPK: ORAL_TABLET | ORAL | 0 refills | Status: AC

## 2023-06-12 MED ORDER — BENZONATATE 200 MG PO CAPS: 200.0000 mg | ORAL_CAPSULE | Freq: Three times a day (TID) | ORAL | 0 refills | Status: AC | PRN

## 2023-06-12 NOTE — Telephone Encounter (Signed)
Patient contacted NP stated fever 38C started Saturday along with mostly nonproductive cough.  Denied n/v/d. Having back pain and burning in chest.  Has used albuterol and tessalon pearles previously.  Last positive covid test Sep 2023.  Has recently traveled internationally for vacation June.  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  Has not taken home covid test all her tests expired 2023.  Reviewed FDA website with patient and test expiration was extended from January to November but still expired 2023.  Recommended she go through drive through and show her pharmacy card to get 4 free tests with her insurance card at [facility].  Patient using ibuprofen 800mg  po TID prn fever.  Discussed may have flu, covid, other virus, pneumonia.  Contact me with home covid test results once completed today.  A&Ox3 duration of telephone call 10 minutes.  Audible coughing during call nonproductive/no audible wheezing/congestion.  Patient would like refill on tessalon pearles.  Stated feels okay until she coughs.  Discussed if patient covid test positive will call in paxlovid for patient.  If negative call in tamiflu.  Electronic rx tessalon pearles 200mg  po TID prn cough #30 RF0 and prednisone 10mg  taper po daily with breakfast (60/50/40/30/20/10) #21 RF0 sent to her pharmacy of choice.  Patient denied wheezing does not want albuterol inhaler refill at this time.  Discussed avoid dehydration drink water/rest today.  Notify clinic staff if new or worsening symptoms e.g. shortness of breath, productive cough, worsening pain/fever/vomiting/diarrhea.  Quarantine at home today until covid test results known.  Frequent hand washing.  Patient to wear mask when around others/isolate until symptoms resolve.  RN Kimrey notified to add patient to HR infectious disease list/notify HR and supervisor excused absence for today and re-evaluation tomorrow covid test results pending.  Patient agreed with plan of care  and had no further questions at this time.

## 2023-06-13 NOTE — Telephone Encounter (Signed)
Patient reports still having lots of Coughing, not has bad pain,  slept all night, and no fever. A&Ox3 duration of telephone call 5 minutes. Audible coughing during call. Patient reports productive yellow color/no audible wheezing. Discussed avoid dehydration drink water/rest today. Notify clinic staff if new or worsening symptoms e.g. shortness of breath, productive cough, worsening pain/fever/vomiting/diarrhea. Frequent hand washing. Patient to wear mask when around others/isolate until symptoms resolve. HR and supervisor made aware able to RTW to 7/17. However is she on vacation so RTW on Monday. Patient agreed with plan of care and had no further questions at this time.

## 2023-06-22 NOTE — Telephone Encounter (Signed)
Patient reported cough improving stayed home entire vacation/rested.  Still having mild nonproductive cough intermittent throughout day but improving.  Discussed if worsening see clinic staff for re-evaluation. Patient seen in workcenter today.  A&Ox3 throat clearing mild noted x1 respirations even and unlabored spoke full sentences without difficulty skin warm dry and pink

## 2023-06-25 NOTE — Progress Notes (Signed)
Noted patient had blood pressure follow up checks with RN Rolly Salter in clinic see epic notes  office visit with me 06/28/22 and tcon dated 07/16/22 saw [facility] 09/21/22;  Be Well 2025 completed BP 130/80 LDL 147 Hgba1c 5.6 and weight 196lbs met discount requirements NP signed provider paperwork 02/23/23 and HR team notified patient met insurance discount requirements by RN [employer]

## 2023-07-11 ENCOUNTER — Ambulatory Visit: Payer: No Typology Code available for payment source | Admitting: Registered Nurse

## 2023-07-11 ENCOUNTER — Encounter: Payer: Self-pay | Admitting: Registered Nurse

## 2023-07-11 VITALS — BP 150/90 | HR 74 | Temp 98.0°F | Resp 16

## 2023-07-11 DIAGNOSIS — R051 Acute cough: Secondary | ICD-10-CM

## 2023-07-11 DIAGNOSIS — R030 Elevated blood-pressure reading, without diagnosis of hypertension: Secondary | ICD-10-CM

## 2023-07-11 DIAGNOSIS — J00 Acute nasopharyngitis [common cold]: Secondary | ICD-10-CM

## 2023-07-11 MED ORDER — PHENYLEPHRINE HCL 10 MG PO TABS: 10.0000 mg | ORAL_TABLET | Freq: Four times a day (QID) | ORAL | Status: AC | PRN

## 2023-07-11 MED ORDER — FLUTICASONE PROPIONATE 50 MCG/ACT NA SUSP: 1.0000 | Freq: Two times a day (BID) | NASAL | 4 refills | Status: DC

## 2023-07-11 MED ORDER — SALINE SPRAY 0.65 % NA SOLN: 2.0000 | NASAL | Status: DC

## 2023-07-11 MED ORDER — BENZONATATE 200 MG PO CAPS: 200.0000 mg | ORAL_CAPSULE | Freq: Three times a day (TID) | ORAL | 0 refills | Status: AC | PRN

## 2023-07-11 MED ORDER — LORATADINE 10 MG PO TABS: 10.0000 mg | ORAL_TABLET | Freq: Every day | ORAL | Status: DC

## 2023-07-11 NOTE — Patient Instructions (Signed)
Shortness of Breath, Adult Shortness of breath is when a person has trouble breathing or when a person feels like she or he is having trouble breathing in enough air. Shortness of breath could be a sign of a medical problem. Follow these instructions at home:  Pollutants Do not use any products that contain nicotine or tobacco. These products include cigarettes, chewing tobacco, and vaping devices, such as e-cigarettes. This also includes cigars and pipes. If you need help quitting, ask your health care provider. Avoid things that can irritate your airways, including: Smoke. This includes campfire smoke, forest fire smoke, and secondhand smoke from tobacco products. Do not smoke or allow others to smoke in your home. Mold. Dust. Air pollution. Chemical fumes. Things that can give you an allergic reaction (allergens) if you have allergies. Common allergens include pollen from grasses or trees and animal dander. Keep your living space clean and free of mold and dust. General instructions Pay attention to any changes in your symptoms. Take over-the-counter and prescription medicines only as told by your health care provider. This includes oxygen therapy and inhaled medicines. Rest as needed. Return to your normal activities as told by your health care provider. Ask your health care provider what activities are safe for you. Keep all follow-up visits. This is important. Contact a health care provider if: Your condition does not improve as soon as expected. You have a hard time doing your normal activities, even after you rest. You have new symptoms. You cannot walk up stairs or exercise the way that you normally do. Get help right away if: Your shortness of breath gets worse. You have shortness of breath when you are resting. You feel light-headed or you faint. You have a cough that is not controlled with medicines. You cough up blood. You have pain with breathing. You have pain in your  chest, arms, shoulders, or abdomen. You have a fever. These symptoms may be an emergency. Get help right away. Call 911. Do not wait to see if the symptoms will go away. Do not drive yourself to the hospital. Summary Shortness of breath is when a person has trouble breathing enough air. It can be a sign of a medical problem. Avoid things that irritate your lungs, such as smoking, pollution, mold, and dust. Pay attention to changes in your symptoms and contact your health care provider if you have a hard time completing daily activities because of shortness of breath. This information is not intended to replace advice given to you by your health care provider. Make sure you discuss any questions you have with your health care provider. Document Revised: 07/03/2021 Document Reviewed: 07/03/2021 Elsevier Patient Education  2024 [employer]. Cough, Adult Coughing is a reflex that clears your throat and airways (respiratory system). It helps heal and protect your lungs. It is normal to cough from time to time. A cough that happens with other symptoms or that lasts a long time may be a sign of a condition that needs treatment. A short-term (acute) cough may only last 2-3 weeks. A long-term (chronic) cough may last 8 or more weeks. Coughing is often caused by: Diseases, such as: An infection of the respiratory system. Asthma or other heart or lung diseases. Gastroesophageal reflux. This is when acid comes back up from the stomach. Breathing in things that irritate your lungs. Allergies. Postnasal drip. This is when mucus runs down the back of your throat. Smoking. Some medicines. Follow these instructions at home: Medicines Take over-the-counter and  prescription medicines only as told by your health care provider. Talk with your provider before you take cough medicine (cough suppressants). Eating and drinking Do not drink alcohol. Avoid caffeine. Drink enough fluid to keep your pee (urine)  pale yellow. Lifestyle Avoid cigarette smoke. Do not use any products that contain nicotine or tobacco. These products include cigarettes, chewing tobacco, and vaping devices, such as e-cigarettes. If you need help quitting, ask your provider. Avoid things that make you cough. These may include perfumes, candles, cleaning products, or campfire smoke. General instructions  Watch for any changes to your cough. Tell your provider about them. Always cover your mouth when you cough. If the air is dry in your bedroom or home, use a cool mist vaporizer or humidifier. If your cough is worse at night, try to sleep in a semi-upright position. Rest as needed. Contact a health care provider if: You have new symptoms, or your symptoms get worse. You cough up pus. You have a fever that does not go away or a cough that does not get better after 2-3 weeks. You cannot control your cough with medicine, and you are losing sleep. You have pain that gets worse or is not helped with medicine. You lose weight for no clear reason. You have night sweats. Get help right away if: You cough up blood. You have trouble breathing. Your heart is beating very fast. These symptoms may be an emergency. Get help right away. Call 911. Do not wait to see if the symptoms will go away. Do not drive yourself to the hospital. This information is not intended to replace advice given to you by your health care provider. Make sure you discuss any questions you have with your health care provider. Document Revised: 07/15/2022 Document Reviewed: 07/15/2022 Elsevier Patient Education  2024 Elsevier Inc. Viral Respiratory Infection A respiratory infection is an illness that affects part of the respiratory system, such as the lungs, nose, or throat. A respiratory infection that is caused by a virus is called a viral respiratory infection. Common types of viral respiratory infections include: A cold. The flu (influenza). A  respiratory syncytial virus (RSV) infection. What are the causes? This condition is caused by a virus. The virus may spread through contact with droplets or direct contact with infected people or their mucus or secretions. The virus may spread from person to person (is contagious). What are the signs or symptoms? Symptoms of this condition include: A stuffy or runny nose. A sore throat or cough. Shortness of breath or difficulty breathing. Yellow or green mucus (sputum). Other symptoms may include: A fever. Sweating or chills. Fatigue. Achy muscles. A headache. How is this diagnosed? This condition may be diagnosed based on: Your symptoms. A physical exam. Testing of secretions from the nose or throat. Chest X-ray. How is this treated? This condition may be treated with medicines, such as: Antiviral medicine. This may shorten the length of time a person has symptoms. Expectorants. These make it easier to cough up mucus. Decongestant nasal sprays. Acetaminophen or NSAIDs, such as ibuprofen, to relieve fever and pain. Antibiotic medicines are not prescribed for viral infections.This is because antibiotics are designed to kill bacteria. They do not kill viruses. Follow these instructions at home: Managing pain and congestion Take over-the-counter and prescription medicines only as told by your health care provider. If you have a sore throat, gargle with a mixture of salt and water 3-4 times a day or as needed. To make salt water, completely dissolve -  1 tsp (3-6 g) of salt in 1 cup (237 mL) of warm water. Use nose drops made from salt water to ease congestion and soften raw skin around your nose. Take 2 tsp (10 mL) of honey at bedtime to lessen coughing at night. Do not give honey to children who are younger than 1 year. Drink enough fluid to keep your urine pale yellow. This helps prevent dehydration and helps loosen up mucus. General instructions  Rest as much as possible. Do  not drink alcohol. Do not use any products that contain nicotine or tobacco. These products include cigarettes, chewing tobacco, and vaping devices, such as e-cigarettes. If you need help quitting, ask your health care provider. Keep all follow-up visits. This is important. How is this prevented?     Get an annual flu shot. You may get the flu shot in late summer, fall, or winter. Ask your health care provider when you should get your flu shot. Avoid spreading your infection to other people. If you are sick: Wash your hands with soap and water often, especially after you cough or sneeze. Wash for at least 20 seconds. If soap and water are not available, use alcohol-based hand sanitizer. Cover your mouth when you cough. Cover your nose and mouth when you sneeze. Do not share cups or eating utensils. Clean commonly used objects often. Clean commonly touched surfaces. Stay home from work or school as told by your health care provider. Avoid contact with people who are sick during cold and flu season. This is generally fall and winter. Contact a health care provider if: Your symptoms last for 10 days or longer. Your symptoms get worse over time. You have severe sinus pain in your face or forehead. The glands in your jaw or neck become very swollen. You have shortness of breath. Get help right away if you: Feel pain or pressure in your chest. Have trouble breathing. Faint or feel like you will faint. Have severe and persistent vomiting. Feel confused or disoriented. These symptoms may represent a serious problem that is an emergency. Do not wait to see if the symptoms will go away. Get medical help right away. Call your local emergency services (911 in the [place] Do not drive yourself to the hospital. Summary A respiratory infection is an illness that affects part of the respiratory system, such as the lungs, nose, or throat. A respiratory infection that is caused by a virus is called a viral  respiratory infection. Common types of viral respiratory infections include a cold, influenza, and respiratory syncytial virus (RSV) infection. Symptoms of this condition include a stuffy or runny nose, cough, fatigue, achy muscles, sore throat, and fevers or chills. Antibiotic medicines are not prescribed for viral infections. This is because antibiotics are designed to kill bacteria. They are not effective against viruses. This information is not intended to replace advice given to you by your health care provider. Make sure you discuss any questions you have with your health care provider. Document Revised: 02/18/2021 Document Reviewed: 02/18/2021 Elsevier Patient Education  2024 [employer].

## 2023-07-11 NOTE — Progress Notes (Signed)
Subjective:    Patient ID: Kimberly Hall, female    DOB: 1960/12/21, 62 y.o.   MRN: [id number]  62y/o caucasian female established patient here for evaluation shortness of breath with dancing this weekend had to stop at wedding dance couldn't dance like usual out of breath.  Denied shortness of breath at rest or when at work or home.  Intermittent nonproductive cough since July ran out of tessalon pearles. Denied fever/chills/rhinitis/congestion/sore throat/headache/chest pain/n/v/d/body aches/visual changes.  Would also like to verify if [employer] insurance discount received for FY 25      Review of Systems  Constitutional:  Negative for chills, fatigue and fever.  HENT:  Negative for congestion, nosebleeds, postnasal drip, rhinorrhea, sinus pressure, sinus pain, sneezing, sore throat, trouble swallowing and voice change.   Eyes:  Negative for photophobia and visual disturbance.  Respiratory:  Positive for cough and shortness of breath. Negative for wheezing and stridor.   Cardiovascular:  Negative for chest pain and leg swelling.  Gastrointestinal:  Negative for diarrhea, nausea and vomiting.  Genitourinary:  Negative for difficulty urinating.  Musculoskeletal:  Negative for back pain, gait problem, myalgias, neck pain and neck stiffness.  Allergic/Immunologic: Positive for environmental allergies.  Neurological:  Negative for dizziness, tremors, seizures, syncope, facial asymmetry, speech difficulty, weakness, light-headedness, numbness and headaches.  Hematological:  Negative for adenopathy.  Psychiatric/Behavioral:  Negative for agitation, confusion and sleep disturbance.        Objective:   Physical Exam Vitals and nursing note reviewed.  Constitutional:      General: She is awake. She is not in acute distress.    Appearance: Normal appearance. She is well-developed, well-groomed and overweight. She is not ill-appearing, toxic-appearing or diaphoretic.  HENT:     Head:  Normocephalic and atraumatic.     Jaw: There is normal jaw occlusion. No trismus.     Salivary Glands: Right salivary gland is not diffusely enlarged or tender. Left salivary gland is not diffusely enlarged or tender.     Right Ear: Hearing, ear canal and external ear normal. No decreased hearing noted. No laceration, drainage, swelling or tenderness. A middle ear effusion is present. There is no impacted cerumen. No foreign body. No mastoid tenderness. No PE tube. No hemotympanum. Tympanic membrane is not injected, scarred, perforated, erythematous, retracted or bulging.     Left Ear: Hearing, ear canal and external ear normal. No decreased hearing noted. No laceration, drainage, swelling or tenderness. A middle ear effusion is present. There is no impacted cerumen. No foreign body. No mastoid tenderness. No PE tube. No hemotympanum. Tympanic membrane is not injected, scarred, perforated, erythematous, retracted or bulging.     Nose: Mucosal edema, congestion and rhinorrhea present. No nasal deformity, septal deviation, laceration or nasal tenderness. Rhinorrhea is clear.     Right Turbinates: Enlarged and swollen. Not pale.     Left Turbinates: Enlarged and swollen. Not pale.     Right Sinus: No maxillary sinus tenderness or frontal sinus tenderness.     Left Sinus: No maxillary sinus tenderness or frontal sinus tenderness.     Mouth/Throat:     Lips: Pink. No lesions.     Mouth: Mucous membranes are moist. Mucous membranes are not pale, not dry and not cyanotic. No lacerations, oral lesions or angioedema.     Dentition: Abnormal dentition. Has dentures. No dental abscesses or gum lesions.     Tongue: No lesions. Tongue does not deviate from midline.     Palate: No mass and  lesions.     Pharynx: Uvula midline. Pharyngeal swelling, posterior oropharyngeal erythema and postnasal drip present. No oropharyngeal exudate or uvula swelling.     Tonsils: No tonsillar exudate or tonsillar abscesses.      Comments: Bilateral allergic shiners; patient anxious teary runny nose with crying clear bilateral nares in clinic worried about shortness of breath this weekend and not being able to dance; cobblestoning posterior pharynx; bilateral TMs intact air fluid level clear Eyes:     General: Lids are normal. Vision grossly intact. Gaze aligned appropriately. Allergic shiner present. No scleral icterus.       Right eye: No foreign body, discharge or hordeolum.        Left eye: No foreign body, discharge or hordeolum.     Extraocular Movements:     Right eye: Normal extraocular motion and no nystagmus.     Left eye: Normal extraocular motion and no nystagmus.     Conjunctiva/sclera: Conjunctivae normal.     Right eye: Right conjunctiva is not injected. No chemosis, exudate or hemorrhage.    Left eye: Left conjunctiva is not injected. No chemosis, exudate or hemorrhage.    Pupils: Pupils are equal, round, and reactive to light. Pupils are equal.     Right eye: Pupil is round and reactive.     Left eye: Pupil is round and reactive.  Neck:     Thyroid: No thyroid mass or thyromegaly.     Trachea: Trachea and phonation normal. No tracheal tenderness or tracheal deviation.  Cardiovascular:     Rate and Rhythm: Normal rate and regular rhythm.     Pulses:          Radial pulses are 2+ on the right side and 2+ on the left side.     Heart sounds: Normal heart sounds, S1 normal and S2 normal.  Pulmonary:     Effort: Pulmonary effort is normal. No accessory muscle usage or respiratory distress.     Breath sounds: Normal breath sounds and air entry. No stridor, decreased air movement or transmitted upper airway sounds. No decreased breath sounds, wheezing, rhonchi or rales.     Comments: Spoke full sentences without difficulty; no cough observed in clinic; throat clearing after crying observed; BBS CTA sp02 stable and at baseline compared to previous visits in Epic Chest:     Chest wall: No tenderness.   Abdominal:     General: There is no distension.     Palpations: Abdomen is soft.  Musculoskeletal:        General: No tenderness. Normal range of motion.     Right hand: Normal strength. Normal capillary refill.     Left hand: Normal strength. Normal capillary refill.     Cervical back: Normal range of motion and neck supple. No swelling, edema, deformity, erythema, signs of trauma, lacerations, rigidity, spasms, torticollis, tenderness, bony tenderness or crepitus. No pain with movement. Normal range of motion.     Thoracic back: No swelling, edema, deformity, signs of trauma, lacerations, spasms or tenderness. Normal range of motion.     Right lower leg: No edema.     Left lower leg: No edema.  Lymphadenopathy:     Head:     Right side of head: No submental, submandibular, tonsillar, preauricular, posterior auricular or occipital adenopathy.     Left side of head: No submental, submandibular, tonsillar, preauricular, posterior auricular or occipital adenopathy.     Cervical: No cervical adenopathy.     Right cervical: No superficial,  deep or posterior cervical adenopathy.    Left cervical: No superficial, deep or posterior cervical adenopathy.  Skin:    General: Skin is warm and dry.     Capillary Refill: Capillary refill takes less than 2 seconds.     Coloration: Skin is not ashen, cyanotic, jaundiced, mottled, pale or sallow.     Findings: No abrasion, abscess, acne, bruising, burn, ecchymosis, erythema, signs of injury, laceration, lesion, petechiae, rash or wound.     Nails: There is no clubbing.  Neurological:     Mental Status: She is alert and oriented to person, place, and time. She is not disoriented.     GCS: GCS eye subscore is 4. GCS verbal subscore is 5. GCS motor subscore is 6.     Cranial Nerves: Cranial nerves 2-12 are intact. No cranial nerve deficit, dysarthria or facial asymmetry.     Sensory: No sensory deficit.     Motor: Motor function is intact. No weakness,  tremor, atrophy, abnormal muscle tone or seizure activity.     Coordination: Coordination is intact. Coordination normal.     Gait: Gait is intact. Gait normal.     Comments: In/out of chair without difficulty; gait sure and steady in clinic; bilateral hand grasp equal 5/5  Psychiatric:        Attention and Perception: Attention and perception normal.        Mood and Affect: Mood is anxious. Affect is tearful.        Speech: Speech normal.        Behavior: Behavior normal. Behavior is cooperative.        Thought Content: Thought content normal.        Cognition and Memory: Cognition and memory normal.        Judgment: Judgment normal.     Comments: Worried about not being able to dance and something she enjoys very much      Home covid test results negative;  test again in 48 hours if new or worsening symptoms e.g. sore throat, body aches, n/v/d, loss of taste/smell, fever, chills and if negative again retest in 48 hours as current strain taking longer to turn positive on home tests.  Patient has tests available at home does not want new Rx for more tests at this time.  Discussed her insurance now requires provider order for 4 free tests per month.  Patient agreed with plan of care and had no further questions at this time.     Assessment & Plan:   A-acute cough and rhinitis, elevated blood pressure reading in office  P-discussed with patient could be covid given 1 free [place] govt home covid test to perform prior to returning to [facility].  Discussed sp02 stable 98% and BBS CTA today.  Post nasal drip seen on exam and most likely the cause of her cough.  Discussed covid in wastewater red level this week [address] and increasing throughout [place] and [place] 27 states in high wastewater levels.  Fall allergies also flaring with rain (fungal allergies) and ragweed season starting.  Restart Patient may use normal saline nasal spray 2 sprays each nostril q2h wa as needed given 1 bottle from clinic  stock. flonase 1 spray each nostril BID #16gm RF4 electronic rx sent to her pharmacy of choice.  Refilled tessalon pearles 200mg  po TID prn cough #60 RF0 electronic Rx sent to her pharmacy of choice  phenylephrine 10mg  po q6h prn rhinitis/congestion given 3 UD from clinic stock.  Patient  denied personal or family history of ENT cancer.  OTC antihistamine of choice claritin/zyrtec 10mg  po daily. Given 3 UD claritin from clinic stock  Avoid triggers if possible.  Shower prior to bedtime if exposed to triggers.  If allergic dust/dust mites recommend mattress/pillow covers/encasements; washing linens, vacuuming, sweeping, dusting weekly.  Call or return to clinic as needed if these symptoms worsen or fail to improve as anticipated.   Exitcare handout on allergic rhinitis, viral URI, cough and sinus rinse given to patient.  Patient verbalized understanding of instructions, agreed with plan of care and had no further questions at this time.   [employer] paperwork reviewed patient met requirements for discount and HR team notified.  Patient to ensure [employer] account working/set up and she requested assistance with my chart set up today also.  Sent text message invite and her phone stating invite expired even though received less than 5 minutes prior.  Discussed will need her to return 07/13/23 for my chart assistance as dept meeting starting and unable to wait for app to download and trouble shoot at this time.  Patient agreed to return 07/13/23.  Patient verbalized understanding information/instructions and had no further questions at this time. P2:  Avoidance and hand washing.   Anxious and upset today will recheck in 48 hours 07/13/23 continue taking amlodipine daily by mouth.  Keeping salt intake to 2000mg  or less per day.  Exercise 150 minutes per week.  Patient agreed with plan of care and had no further questions at this time.

## 2023-11-09 ENCOUNTER — Encounter: Payer: Self-pay | Admitting: Registered Nurse

## 2023-11-09 ENCOUNTER — Ambulatory Visit: Payer: No Typology Code available for payment source | Admitting: Registered Nurse

## 2023-11-09 VITALS — BP 140/82 | HR 68 | Temp 97.2°F

## 2023-11-09 DIAGNOSIS — L03011 Cellulitis of right finger: Secondary | ICD-10-CM

## 2023-11-09 NOTE — Progress Notes (Signed)
Subjective:    Patient ID: Kimberly Hall, female    DOB: 12-Oct-1961, 62 y.o.   MRN: [id number]  62y/o caucasian established female patient here for evaluation pain/swelling/redness right thumb.  Saw RN Burna Mortimer yesterday and was told to soak in epsom salt hot water last night this am woke up and finger draining yellow fluid, increased swelling.  Denied fever/chills/trauma.  "I had fungal infection a couple weeks ago and trimmed cuticle of thumbnail"  taking tylenol or motrin prn pain      Review of Systems  Constitutional:  Negative for chills and fever.  HENT:  Negative for trouble swallowing and voice change.   Respiratory:  Negative for cough.   Gastrointestinal:  Negative for diarrhea, nausea and vomiting.  Musculoskeletal:  Positive for myalgias. Negative for gait problem, joint swelling, neck pain and neck stiffness.  Skin:  Positive for color change and rash. Negative for pallor and wound.  Neurological:  Negative for tremors, syncope, weakness, numbness and headaches.  Hematological:  Negative for adenopathy. Does not bruise/bleed easily.  Psychiatric/Behavioral:  Negative for agitation, confusion and sleep disturbance.        Objective:   Physical Exam Vitals and nursing note reviewed.  Constitutional:      General: She is awake. She is not in acute distress.    Appearance: Normal appearance. She is well-developed, well-groomed and overweight. She is not ill-appearing, toxic-appearing or diaphoretic.  HENT:     Head: Normocephalic and atraumatic.     Jaw: There is normal jaw occlusion.     Salivary Glands: Right salivary gland is not diffusely enlarged or tender. Left salivary gland is not diffusely enlarged or tender.     Right Ear: Hearing and external ear normal. No decreased hearing noted.     Left Ear: Hearing and external ear normal. No decreased hearing noted.     Nose: Nose normal. No congestion or rhinorrhea.     Right Nostril: No epistaxis.     Left Nostril:  No epistaxis.     Mouth/Throat:     Lips: Pink. No lesions.     Mouth: Mucous membranes are moist. No oral lesions or angioedema.     Dentition: No gum lesions.     Tongue: No lesions. Tongue does not deviate from midline.     Palate: No mass and lesions.     Pharynx: Oropharynx is clear. Uvula midline.  Eyes:     General: Lids are normal. Vision grossly intact. Gaze aligned appropriately. Allergic shiner present. No scleral icterus.       Right eye: No discharge.        Left eye: No discharge.     Extraocular Movements: Extraocular movements intact.     Conjunctiva/sclera: Conjunctivae normal.     Pupils: Pupils are equal, round, and reactive to light.  Neck:     Trachea: Trachea and phonation normal.  Cardiovascular:     Rate and Rhythm: Normal rate and regular rhythm.     Pulses: Normal pulses.          Radial pulses are 2+ on the right side and 2+ on the left side.  Pulmonary:     Effort: Pulmonary effort is normal.     Breath sounds: Normal breath sounds and air entry. No stridor or transmitted upper airway sounds. No wheezing.     Comments: Spoke full sentences without difficulty; no cough observed in exam room Abdominal:     General: Abdomen is flat.  Palpations: Abdomen is soft.  Musculoskeletal:        General: Swelling and tenderness present. No deformity or signs of injury. Normal range of motion.     Right elbow: No swelling. Normal range of motion.     Left elbow: No swelling. Normal range of motion.     Right forearm: No swelling, edema, deformity, lacerations or tenderness.     Left forearm: No swelling, edema, deformity, lacerations or tenderness.     Right wrist: No swelling, deformity, effusion, lacerations, tenderness or crepitus. Normal range of motion.     Left wrist: No swelling, deformity, effusion, lacerations, tenderness or crepitus. Normal range of motion.     Right hand: Swelling and tenderness present. No deformity, lacerations or bony tenderness.  Normal range of motion. Normal strength. Normal sensation. Normal capillary refill.     Left hand: No swelling, deformity, lacerations, tenderness or bony tenderness. Normal range of motion. Normal strength. Normal sensation. Normal capillary refill.     Cervical back: Normal range of motion and neck supple. No swelling, edema, deformity, erythema, signs of trauma, lacerations, rigidity, torticollis or crepitus. No pain with movement or muscular tenderness. Normal range of motion.     Thoracic back: No swelling, edema, deformity, signs of trauma or lacerations.     Comments: Distal phalanx linear erythema/mild nonpitting edema 1+/4 dorsum only 0.5cm  slightly distal to DIP joint and not touching cuticle/nailbed; crust yellow at cuticle on nail; mildly TTP; full arom without difficulty/ab/adduction; strength fingers bilateral 5/5 equal arom  Lymphadenopathy:     Head:     Right side of head: No submandibular or preauricular adenopathy.     Left side of head: No submandibular or preauricular adenopathy.     Cervical: No cervical adenopathy.     Right cervical: No superficial, deep or posterior cervical adenopathy.    Left cervical: No superficial, deep or posterior cervical adenopathy.  Skin:    General: Skin is warm and dry.     Capillary Refill: Capillary refill takes less than 2 seconds.     Coloration: Skin is not ashen, cyanotic, jaundiced, mottled, pale or sallow.     Findings: Erythema and rash present. No abrasion, abscess, acne, bruising, burn, ecchymosis, signs of injury, laceration, lesion, petechiae or wound. Rash is crusting and macular. Rash is not nodular, papular, purpuric, pustular, scaling, urticarial or vesicular.          Comments: Macular erythema in linear pattern distal thumb dorsum centrally between nail bed/cuticle and DIP joint not touching either; crusting yellow on cuticle/nail bed right thumb  Neurological:     General: No focal deficit present.     Mental Status: She  is alert and oriented to person, place, and time. Mental status is at baseline.     GCS: GCS eye subscore is 4. GCS verbal subscore is 5. GCS motor subscore is 6.     Cranial Nerves: No cranial nerve deficit, dysarthria or facial asymmetry.     Motor: Motor function is intact. No weakness, tremor, atrophy, abnormal muscle tone or seizure activity.     Coordination: Coordination is intact. Coordination normal.     Gait: Gait is intact. Gait normal.     Comments: In/out of chair without difficulty; gait sure and steady in clinic; bilateral hand grasp equal 5/5  Psychiatric:        Attention and Perception: Attention and perception normal.        Mood and Affect: Mood and affect normal.  Speech: Speech normal.        Behavior: Behavior normal. Behavior is cooperative.        Thought Content: Thought content normal.        Cognition and Memory: Cognition and memory normal.        Judgment: Judgment normal.           Assessment & Plan:   A-cellulitis right thumb initial visit  P-Exitcare handout on skin infection and paronychia.  Instructed patient to continue epsom salt soaks after work.  Start keflex 500mg  po BID x 7 days #30 RF0 dispensed from [facility] today to patient. RTC if worsening erythema, pain, purulent discharge, fever. Wash towels, washcloths, sheets in hot water with bleach every couple of days until infection resolved. Wash area with soap and water at least daily.  Do not trim cuticle any further or pick at skin.  Wash area with soap and water at least daily.  Notify NP if no improvement in symptoms after 48 hours of keflex/continued epsom salt soaks nightly. Follow up in 1 week for re-evaluation if rash not completely resolved discussed may need longer course of antibiotics if improving but not resolved.   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  Patient verbalized understanding, agreed with plan of care and had no further questions at this time.

## 2023-11-09 NOTE — Patient Instructions (Addendum)
Paronychia Paronychia is an infection of the skin that surrounds a nail. It usually affects the skin around a fingernail, but it may also occur near a toenail. It often causes pain and swelling around the nail. In some cases, a collection of pus (abscess) can form near or under the nail.  This condition may develop suddenly, or it may develop gradually over a longer period. In most cases, paronychia is not serious, and it will clear up with treatment. What are the causes? This condition may be caused by bacteria or a fungus, such as yeast. The bacteria or fungus can enter the body through an opening in the skin, such as a cut or a hangnail, and cause an infection in your fingernail or toenail. Other causes may include: Recurrent injury to the fingernail or toenail area. Irritation of the base and sides of the nail (cuticle). Injury and irritation can result in inflammation, swelling, and thickened skin around the nail. What increases the risk? This condition is more likely to develop in people who: Get their hands wet often, such as those who work as Fish farm manager, bartenders, or housekeepers. Bite their fingernails or cuticles. Have underlying skin conditions. Have hangnails or injured fingertips. Are exposed to irritants like detergents and other chemicals. Have diabetes. What are the signs or symptoms? Symptoms of this condition include: Redness and swelling of the skin near the nail. Tenderness around the nail when you touch the area. Pus-filled bumps under the cuticle. Fluid or pus under the nail. Throbbing pain in the area. How is this diagnosed? This condition is diagnosed with a physical exam. In some cases, a sample of pus may be tested to determine what type of bacteria or fungus is causing the condition. How is this treated? Treatment depends on the cause and severity of your condition. If your condition is mild, it may clear up on its own in a few days or after soaking in warm  water. If needed, treatment may include: Antibiotic medicine, if your infection is caused by bacteria. Antifungal medicine, if your infection is caused by a fungus. A procedure to drain pus from an abscess. Anti-inflammatory medicine (corticosteroids). Removal of part of an ingrown toenail. A bandage (dressing) may be placed over the affected area if an abscess or part of a nail has been removed. Follow these instructions at home: Wound care Keep the affected area clean. Soak the affected area in warm water if told to do so by your health care provider. You may be told to do this for 20 minutes, 2-3 times a day. Keep the area dry when you are not soaking it. Do not try to drain an abscess yourself. Follow instructions from your health care provider about how to take care of the affected area. Make sure you: Wash your hands with soap and water for at least 20 seconds before and after you change your dressing. If soap and water are not available, use hand sanitizer. Change your dressing as told by your health care provider. If you had an abscess drained, check the area every day for signs of infection. Check for: Redness, swelling, or pain. Fluid or blood. Warmth. Pus or a bad smell. Medicines  Take over-the-counter and prescription medicines only as told by your health care provider. If you were prescribed an antibiotic medicine, take it as told by your health care provider. Do not stop taking the antibiotic even if you start to feel better. General instructions Avoid contact with any skin irritants or allergens.  Do not pick at the affected area. Keep all follow-up visits as told. This is important. Prevention To prevent this condition from happening again: Wear rubber gloves when washing dishes or doing other tasks that require your hands to get wet. Wear gloves if your hands might come in contact with cleaners or other chemicals. Avoid injuring your nails or fingertips. Do not bite  your nails or tear hangnails. Do not cut your nails very short. Do not cut your cuticles. Use clean nail clippers or scissors when trimming nails. Contact a health care provider if: Your symptoms get worse or do not improve with treatment. You have continued or increased fluid, blood, or pus coming from the affected area. Your affected finger, toe, or joint becomes swollen or difficult to move. You have a fever or chills. There is redness spreading away from the affected area. Summary Paronychia is an infection of the skin that surrounds a nail. It often causes pain and swelling around the nail. In some cases, a collection of pus (abscess) can form near or under the nail. This condition may be caused by bacteria or a fungus. These germs can enter the body through an opening in the skin, such as a cut or a hangnail. If your condition is mild, it may clear up on its own in a few days. If needed, treatment may include medicine or a procedure to drain pus from an abscess. To prevent this condition from happening again, wear gloves if doing tasks that require your hands to get wet or to come in contact with chemicals. Also avoid injuring your nails or fingertips. This information is not intended to replace advice given to you by your health care provider. Make sure you discuss any questions you have with your health care provider. Document Revised: 02/15/2021 Document Reviewed: 02/15/2021 Else[employer] Patient Education  2024 [employer] Cellulitis, Adult  Cellulitis is a skin infection. The infected area is usually warm, red, swollen, and tender. It most commonly occurs on the lower body, such as the legs, feet, and toes, but this condition can occur on any part of the body. The infection can travel to the muscles, blood, and underlying tissue and become life-threatening without treatment. It is important to get medical treatment right away for this condition. What are the causes? Cellulitis is  caused by bacteria. The bacteria enter through a break in the skin, such as a cut, burn, insect or animal bite, open sore, or crack. What increases the risk? This condition is more likely to occur in people who: Have a weak body's defense system (immune system). Are older than 63 years old. Have diabetes. Have a type of long-term (chronic) liver disease (cirrhosis) or kidney disease. Are obese. Have a skin condition such as: An itchy rash, such as eczema or psoriasis. A fungal rash on the feet or in skinfolds. Blistering rashes, such as shingles or chickenpox. Slow movement of blood in the veins (venous stasis). Fluid buildup below the skin (edema). Have open wounds on the skin, such as cuts, puncture wounds, burns, bites, scrapes, tattoos, piercings, or wounds from surgery. Have had radiation therapy. Use IV drugs. What are the signs or symptoms? Symptoms of this condition include: Skin that looks red, purple, or slightly darker than your usual skin color. Streaks or spots on the skin. Swollen area of the skin. Tenderness or pain when an area of the skin is touched. Warm skin. Fever or chills. Blisters. Tiredness (fatigue). How is this diagnosed? This condition is  diagnosed based on a medical history and physical exam. You may also have tests, including: Blood tests. Imaging tests. Tests on a sample of fluid taken from the wound (wound culture). How is this treated? Treatment for this condition may include: Medicines. These may include antibiotics or medicines to treat allergies (antihistamines). Rest. Applying cold or warm wet cloths (compresses) to the skin. If the condition is severe, you may need to stay in the hospital and get antibiotics through an IV. The infection usually starts to get better within 1-2 days of treatment. Follow these instructions at home: Medicines Take over-the-counter and prescription medicines only as told by your health care provider. If you  were prescribed antibiotics, take them as told by your provider. Do not stop using the antibiotic even if you start to feel better. General instructions Drink enough fluid to keep your pee (urine) pale yellow. Do not touch or rub the infected area. Raise (elevate) the infected area above the level of your heart while you are sitting or lying down. Return to your normal activities as told by your provider. Ask your provider what activities are safe for you. Apply warm or cold compresses to the affected area as told by your provider. Keep all follow-up visits. Your provider will need to make sure that a more serious infection is not developing. Contact a health care provider if: You have a fever. Your symptoms do not improve within 1-2 days of starting treatment or you develop new symptoms. Your bone or joint underneath the infected area becomes painful after the skin has healed. Your infection returns in the same area or another area. Signs of this may include: You notice a swollen bump in the infected area. Your red area gets larger, turns dark in color, or becomes more painful. Drainage increases. Pus or a bad smell develops in your infected area. You have more pain. You feel ill and have muscle aches and weakness. You develop vomiting or diarrhea that will not go away. Get help right away if: You notice red streaks coming from the infected area. You notice the skin turns purple or black and falls off. This symptom may be an emergency. Get help right away. Call 911. Do not wait to see if the symptom will go away. Do not drive yourself to the hospital. This information is not intended to replace advice given to you by your health care provider. Make sure you discuss any questions you have with your health care provider. Document Revised: 07/12/2022 Document Reviewed: 07/12/2022 Else[employer] Patient Education  2024 [employer].

## 2023-11-28 ENCOUNTER — Ambulatory Visit: Payer: No Typology Code available for payment source | Admitting: Registered Nurse

## 2023-11-28 ENCOUNTER — Encounter: Payer: Self-pay | Admitting: Registered Nurse

## 2023-11-28 VITALS — BP 158/85 | HR 78 | Temp 97.0°F | Resp 18 | Wt 202.0 lb

## 2023-11-28 DIAGNOSIS — J069 Acute upper respiratory infection, unspecified: Secondary | ICD-10-CM

## 2023-11-28 DIAGNOSIS — R030 Elevated blood-pressure reading, without diagnosis of hypertension: Secondary | ICD-10-CM

## 2023-11-28 MED ORDER — LORATADINE 10 MG PO TABS: 10.0000 mg | ORAL_TABLET | Freq: Every day | ORAL | Status: AC

## 2023-11-28 MED ORDER — BENZONATATE 200 MG PO CAPS: 200.0000 mg | ORAL_CAPSULE | Freq: Three times a day (TID) | ORAL | 0 refills | Status: AC | PRN

## 2023-11-28 MED ORDER — PREDNISONE 10 MG (21) PO TBPK: ORAL_TABLET | ORAL | Status: DC

## 2023-11-28 MED ORDER — SALINE SPRAY 0.65 % NA SOLN: 2.0000 | NASAL | Status: DC

## 2023-11-28 NOTE — Patient Instructions (Addendum)
 Acute Bronchitis, Adult  Acute bronchitis is sudden inflammation of the main airways (bronchi) that come off the windpipe (trachea) in the lungs. The swelling causes the airways to get smaller and make more mucus than normal. This can make it hard to breathe and can cause coughing or noisy breathing (wheezing). Acute bronchitis may last several weeks. The cough may last longer. Allergies, asthma, and exposure to smoke may make the condition worse. What are the causes? This condition can be caused by germs and by substances that irritate the lungs, including: Cold and flu viruses. The most common cause of this condition is the virus that causes the common cold. Bacteria. This is less common. Breathing in substances that irritate the lungs, including: Smoke from cigarettes and other forms of tobacco. Dust and pollen. Fumes from household cleaning products, gases, or burned fuel. Indoor or outdoor air pollution. What increases the risk? The following factors may make you more likely to develop this condition: A weak body's defense system, also called the immune system. A condition that affects your lungs and breathing, such as asthma. What are the signs or symptoms? Common symptoms of this condition include: Coughing. This may bring up clear, yellow, or green mucus from your lungs (sputum). Wheezing. Runny or stuffy nose. Having too much mucus in your lungs (chest congestion). Shortness of breath. Aches and pains, including sore throat or chest. How is this diagnosed? This condition is usually diagnosed based on: Your symptoms and medical history. A physical exam. You may also have other tests, including tests to rule out other conditions, such as pneumonia. These tests include: A test of lung function. Test of a mucus sample to look for the presence of bacteria. Tests to check the oxygen level in your blood. Blood tests. Chest X-ray. How is this treated? Most cases of acute  bronchitis clear up over time without treatment. Your health care provider may recommend: Drinking more fluids to help thin your mucus so it is easier to cough up. Taking inhaled medicine (inhaler) to improve air flow in and out of your lungs. Using a vaporizer or a humidifier. These are machines that add water to the air to help you breathe better. Taking a medicine that thins mucus and clears congestion (expectorant). Taking a medicine that prevents or stops coughing (cough suppressant). It is not common to take an antibiotic medicine for this condition. Follow these instructions at home:  Take over-the-counter and prescription medicines only as told by your health care provider. Use an inhaler, vaporizer, or humidifier as told by your health care provider. Take two teaspoons (10 mL) of honey at bedtime to lessen coughing at night. Drink enough fluid to keep your urine pale yellow. Do not use any products that contain nicotine or tobacco. These products include cigarettes, chewing tobacco, and vaping devices, such as e-cigarettes. If you need help quitting, ask your health care provider. Get plenty of rest. Return to your normal activities as told by your health care provider. Ask your health care provider what activities are safe for you. Keep all follow-up visits. This is important. How is this prevented? To lower your risk of getting this condition again: Wash your hands often with soap and water for at least 20 seconds. If soap and water are not available, use hand sanitizer. Avoid contact with people who have cold symptoms. Try not to touch your mouth, nose, or eyes with your hands. Avoid breathing in smoke or chemical fumes. Breathing smoke or chemical fumes will make  your condition worse. Get the flu shot every year. Contact a health care provider if: Your symptoms do not improve after 2 weeks. You have trouble coughing up the mucus. Your cough keeps you awake at night. You have  a fever. Get help right away if you: Cough up blood. Feel pain in your chest. Have severe shortness of breath. Faint or keep feeling like you are going to faint. Have a severe headache. Have a fever or chills that get worse. These symptoms may represent a serious problem that is an emergency. Do not wait to see if the symptoms will go away. Get medical help right away. Call your local emergency services (911 in the [place] Do not drive yourself to the hospital. Summary Acute bronchitis is inflammation of the main airways (bronchi) that come off the windpipe (trachea) in the lungs. The swelling causes the airways to get smaller and make more mucus than normal. Drinking more fluids can help thin your mucus so it is easier to cough up. Take over-the-counter and prescription medicines only as told by your health care provider. Do not use any products that contain nicotine or tobacco. These products include cigarettes, chewing tobacco, and vaping devices, such as e-cigarettes. If you need help quitting, ask your health care provider. Contact a health care provider if your symptoms do not improve after 2 weeks. This information is not intended to replace advice given to you by your health care provider. Make sure you discuss any questions you have with your health care provider. Document Revised: 02/24/2022 Document Reviewed: 03/17/2021 Elsevier Patient Education  2024 [employer] Viral Respiratory Infection A respiratory infection is an illness that affects part of the respiratory system, such as the lungs, nose, or throat. A respiratory infection that is caused by a virus is called a viral respiratory infection. Common types of viral respiratory infections include: A cold. The flu (influenza). A respiratory syncytial virus (RSV) infection. What are the causes? This condition is caused by a virus. The virus may spread through contact with droplets or direct contact with infected people or their  mucus or secretions. The virus may spread from person to person (is contagious). What are the signs or symptoms? Symptoms of this condition include: A stuffy or runny nose. A sore throat or cough. Shortness of breath or difficulty breathing. Yellow or green mucus (sputum). Other symptoms may include: A fever. Sweating or chills. Fatigue. Achy muscles. A headache. How is this diagnosed? This condition may be diagnosed based on: Your symptoms. A physical exam. Testing of secretions from the nose or throat. Chest X-ray. How is this treated? This condition may be treated with medicines, such as: Antiviral medicine. This may shorten the length of time a person has symptoms. Expectorants. These make it easier to cough up mucus. Decongestant nasal sprays. Acetaminophen  or NSAIDs, such as ibuprofen , to relieve fever and pain. Antibiotic medicines are not prescribed for viral infections.This is because antibiotics are designed to kill bacteria. They do not kill viruses. Follow these instructions at home: Managing pain and congestion Take over-the-counter and prescription medicines only as told by your health care provider. If you have a sore throat, gargle with a mixture of salt and water 3-4 times a day or as needed. To make salt water, completely dissolve -1 tsp (3-6 g) of salt in 1 cup (237 mL) of warm water. Use nose drops made from salt water to ease congestion and soften raw skin around your nose. Take 2 tsp (10 mL) of  honey at bedtime to lessen coughing at night. Do not give honey to children who are younger than 1 year. Drink enough fluid to keep your urine pale yellow. This helps prevent dehydration and helps loosen up mucus. General instructions  Rest as much as possible. Do not drink alcohol. Do not use any products that contain nicotine or tobacco. These products include cigarettes, chewing tobacco, and vaping devices, such as e-cigarettes. If you need help quitting, ask  your health care provider. Keep all follow-up visits. This is important. How is this prevented?     Get an annual flu shot. You may get the flu shot in late summer, fall, or winter. Ask your health care provider when you should get your flu shot. Avoid spreading your infection to other people. If you are sick: Wash your hands with soap and water often, especially after you cough or sneeze. Wash for at least 20 seconds. If soap and water are not available, use alcohol-based hand sanitizer. Cover your mouth when you cough. Cover your nose and mouth when you sneeze. Do not share cups or eating utensils. Clean commonly used objects often. Clean commonly touched surfaces. Stay home from work or school as told by your health care provider. Avoid contact with people who are sick during cold and flu season. This is generally fall and winter. Contact a health care provider if: Your symptoms last for 10 days or longer. Your symptoms get worse over time. You have severe sinus pain in your face or forehead. The glands in your jaw or neck become very swollen. You have shortness of breath. Get help right away if you: Feel pain or pressure in your chest. Have trouble breathing. Faint or feel like you will faint. Have severe and persistent vomiting. Feel confused or disoriented. These symptoms may represent a serious problem that is an emergency. Do not wait to see if the symptoms will go away. Get medical help right away. Call your local emergency services (911 in the [place] Do not drive yourself to the hospital. Summary A respiratory infection is an illness that affects part of the respiratory system, such as the lungs, nose, or throat. A respiratory infection that is caused by a virus is called a viral respiratory infection. Common types of viral respiratory infections include a cold, influenza, and respiratory syncytial virus (RSV) infection. Symptoms of this condition include a stuffy or runny  nose, cough, fatigue, achy muscles, sore throat, and fevers or chills. Antibiotic medicines are not prescribed for viral infections. This is because antibiotics are designed to kill bacteria. They are not effective against viruses. This information is not intended to replace advice given to you by your health care provider. Make sure you discuss any questions you have with your health care provider. Document Revised: 02/18/2021 Document Reviewed: 02/18/2021 Elsevier Patient Education  2024 [employer].

## 2023-11-28 NOTE — Progress Notes (Signed)
 Subjective:    Patient ID: Kimberly Hall, female    DOB: 12/04/1960, 62 y.o.   MRN: [id number]  62y/o caucasian female established patient with cough x 6 days cared for granddaughter last week similar symptoms.  Still noticing dyspnea with exertion hasn't seen PCM for a year discussed with patient to schedule appt with PCM and discuss dyspnea with walking up hill and dancing new in the past year.  Walking at home for exercise.  Clear mucous had chills over weekend denied fever/vomiting/diarrhea.  Muscles sore in stomach from coughing has run out of tessalon  pearles.  Used prednisone  in the past with good relief has not covid tested.     Review of Systems  Constitutional:  Positive for fatigue. Negative for chills and fever.  HENT:  Positive for congestion, postnasal drip, rhinorrhea and sore throat. Negative for ear discharge, ear pain, facial swelling, hearing loss, mouth sores, sinus pressure, sinus pain, tinnitus, trouble swallowing and voice change.   Eyes:  Negative for photophobia and visual disturbance.  Respiratory:  Positive for cough, shortness of breath and wheezing. Negative for stridor.   Cardiovascular:  Negative for leg swelling.  Gastrointestinal:  Negative for diarrhea, nausea and vomiting.  Genitourinary:  Negative for difficulty urinating.  Musculoskeletal:  Positive for myalgias. Negative for gait problem, neck pain and neck stiffness.  Skin:  Negative for color change, rash and wound.  Neurological:  Negative for dizziness, tremors, syncope, facial asymmetry, speech difficulty, weakness and headaches.  Hematological:  Negative for adenopathy.  Psychiatric/Behavioral:  Positive for sleep disturbance. Negative for agitation and confusion.        Objective:   Physical Exam Vitals reviewed.  Constitutional:      General: She is awake. She is not in acute distress.    Appearance: Normal appearance. She is well-developed and well-groomed. She is obese. She is not  ill-appearing, toxic-appearing or diaphoretic.  HENT:     Head: Normocephalic and atraumatic.     Jaw: There is normal jaw occlusion.     Salivary Glands: Right salivary gland is not diffusely enlarged or tender. Left salivary gland is not diffusely enlarged or tender.     Right Ear: Hearing and external ear normal. No decreased hearing noted. No laceration, drainage, swelling or tenderness. A middle ear effusion is present. There is no impacted cerumen. No foreign body. No mastoid tenderness. No PE tube. No hemotympanum. Tympanic membrane is not injected, scarred, perforated, erythematous, retracted or bulging.     Left Ear: Hearing and external ear normal. No decreased hearing noted. No laceration, drainage, swelling or tenderness. A middle ear effusion is present. There is no impacted cerumen. No foreign body. No mastoid tenderness. No PE tube. No hemotympanum. Tympanic membrane is not injected, scarred, perforated, erythematous, retracted or bulging.     Ears:     Comments: Air fluid level clear no debris noted in auditory canals TMs intact    Nose: Nose normal. No congestion or rhinorrhea.     Right Nostril: No epistaxis.     Left Nostril: No epistaxis.     Right Turbinates: Not enlarged, swollen or pale.     Left Turbinates: Not enlarged, swollen or pale.     Right Sinus: No maxillary sinus tenderness or frontal sinus tenderness.     Left Sinus: No maxillary sinus tenderness or frontal sinus tenderness.     Mouth/Throat:     Lips: Pink. No lesions.     Mouth: Mucous membranes are moist. No oral lesions  or angioedema.     Dentition: No gum lesions.     Tongue: No lesions. Tongue does not deviate from midline.     Palate: No mass and lesions.     Pharynx: Pharyngeal swelling, posterior oropharyngeal erythema and postnasal drip present. No oropharyngeal exudate or uvula swelling.     Tonsils: No tonsillar exudate.     Comments: Cobblestoning posterior pharynx; audible nasal congestion;  clear discharge bilateral nasal turbinates; erythema oropharynx; bilateral allergic shiners Eyes:     General: Lids are normal. Vision grossly intact. Gaze aligned appropriately. Allergic shiner present. No scleral icterus.       Right eye: No discharge.        Left eye: No discharge.     Extraocular Movements: Extraocular movements intact.     Conjunctiva/sclera: Conjunctivae normal.     Pupils: Pupils are equal, round, and reactive to light.  Neck:     Trachea: Trachea and phonation normal.  Cardiovascular:     Rate and Rhythm: Normal rate and regular rhythm.     Pulses:          Radial pulses are 2+ on the right side and 2+ on the left side.     Heart sounds: Normal heart sounds, S1 normal and S2 normal.  Pulmonary:     Effort: Pulmonary effort is normal. No respiratory distress.     Breath sounds: Normal air entry. No stridor or transmitted upper airway sounds. Examination of the right-lower field reveals decreased breath sounds. Examination of the left-lower field reveals decreased breath sounds. Decreased breath sounds present. No wheezing, rhonchi or rales.     Comments: Spoke full sentences without difficulty; nonproductive frequent cough observed in exam room Abdominal:     General: Abdomen is flat.  Musculoskeletal:        General: Normal range of motion.     Right hand: Normal strength. Normal capillary refill.     Left hand: Normal strength. Normal capillary refill.     Cervical back: Normal range of motion and neck supple. No swelling, edema, deformity, erythema, signs of trauma, lacerations, rigidity, spasms, torticollis, tenderness or crepitus. No pain with movement or muscular tenderness. Normal range of motion.     Thoracic back: No swelling, edema, deformity, signs of trauma, lacerations, spasms or tenderness. Normal range of motion.     Right lower leg: No edema.     Left lower leg: No edema.  Lymphadenopathy:     Head:     Right side of head: No submental,  submandibular, tonsillar, preauricular, posterior auricular or occipital adenopathy.     Left side of head: No submental, submandibular, tonsillar, preauricular, posterior auricular or occipital adenopathy.     Cervical: No cervical adenopathy.     Right cervical: No superficial, deep or posterior cervical adenopathy.    Left cervical: No superficial, deep or posterior cervical adenopathy.  Skin:    General: Skin is warm and dry.     Capillary Refill: Capillary refill takes less than 2 seconds.     Coloration: Skin is not ashen, cyanotic, jaundiced, mottled, pale or sallow.     Findings: No abrasion, abscess, acne, bruising, burn, ecchymosis, erythema, signs of injury, laceration, lesion, petechiae, rash or wound.     Nails: There is no clubbing.  Neurological:     General: No focal deficit present.     Mental Status: She is alert and oriented to person, place, and time. Mental status is at baseline.  GCS: GCS eye subscore is 4. GCS verbal subscore is 5. GCS motor subscore is 6.     Cranial Nerves: Cranial nerves 2-12 are intact. No cranial nerve deficit, dysarthria or facial asymmetry.     Sensory: Sensation is intact.     Motor: Motor function is intact. No weakness, tremor, atrophy, abnormal muscle tone or seizure activity.     Coordination: Coordination is intact. Coordination normal.     Gait: Gait is intact. Gait normal.     Comments: In/out of chair without difficulty; gait sure and steady in clinic; bilateral hand grasp equal 5/5  Psychiatric:        Attention and Perception: Attention and perception normal.        Mood and Affect: Mood and affect normal.        Speech: Speech normal.        Behavior: Behavior normal. Behavior is cooperative.        Thought Content: Thought content normal.        Cognition and Memory: Cognition and memory normal.        Judgment: Judgment normal.      Home covid test negative     Assessment & Plan:  A-viral URI with ocugh, elevated  blood pressure without diagnosis hypertension  P-Discussed elevated blood pressure most likely related to illness.  Avoid further caffeine today.  Prednisone  10mg  taper with breakfast (60/50/40/30/20/10) #21 RF0 dispensed from [facility] to patient.  Discussed possible side effects elevated blood pressure/HR, insomnia and increase or decrease in appetite.  Refilled tessalon  pearles 200mg  po TID prn cough #60 RF0 electronic rx sent to her pharmacy of choice.  Given 1 free [place]  govt home covid test to perform prior to returning to workcenter.    Discussed many viruses circulating in community symptoms typically last 2 weeks for runny nose congestion cough.  If new or worsening symptoms e.g. fever greater than 100.12F, vomiting or diarrhea 24 hours prior to work stay home and contact NP via my [email or web address] due to employer infectious disease policy.  Patient may use normal saline nasal spray 2 sprays each nostril q2h wa as needed given 1 bottle from clinic stock. flonase  50mcg 1 spray each nostril BID #1 RF0.  Patient denied personal or family history of ENT cancer.  OTC antihistamine of choice claritin /zyrtec 10mg  po daily. Given 2 UD loratadine  from clinic stock..  Shower as needed for congestion Call or return to clinic as needed if these symptoms worsen or fail to improve as anticipated.   Exitcare handout on bronchitis and viral infection.  Discussed RSV, adenovirus, norovirus, covid, flu all on the rise in [place] /local community.  Patient verbalized understanding of instructions, agreed with plan of care and had no further questions at this time.  P2:  Avoidance and hand washing.   Discussed schedule appt with [facility] for evaluation chronic dyspnea with exertion new over the past year even when not sick.  Last appt 1 year ago per patient.  Discussed honey 1 tablespoon every 4 hours prn cough.  Cough lozenges po q2h prn cough given 8 UD from clinic stock.  Prednisone  taper 10mg  (60/50/40/30/20/10mg ) po daily with  breakfast #21 RF0 dispensed from [facility].  Discussed possible side effects increased/decreased appetite, difficulty sleeping, increased blood sugar, increased blood pressure and heart rate.  Albuterol MDI 90mcg 1-2 puffs po q4-6h prn protracted cough/wheeze #1 RF0 side effect increased heart rate. Bronchitis simple, community acquired, may have started as viral (probably respiratory syncytial, parainfluenza, influenza, or adenovirus),  but now evidence of acute purulent bronchitis with resultant bronchial edema and mucus formation.  Viruses are the most common cause of bronchial inflammation in otherwise healthy adults with acute bronchitis.  The appearance of sputum is not predictive of whether a bacterial infection is present.  Purulent sputum is most often caused by viral infections.  There are a small portion of those caused by non-viral agents being Mycoplama pneumonia.  Microscopic examination or C&S of sputum in the healthy adult with acute bronchitis is generally not helpful (usually negative or normal respiratory flora) other considerations being cough from upper respiratory tract infections, sinusitis or allergic syndromes (mild asthma or viral pneumonia).  Differential Diagnoses:  reactive airway disease (asthma, allergic aspergillosis (eosinophilia), chronic bronchitis, respiratory infection (sinusitis, common cold, pneumonia), congestive heart failure, reflux esophagitis, bronchogenic tumor, aspiration syndromes and/or exposure to pulmonary irritants/smoke. Without high fever, severe dyspnea, lack of physical findings or other risk factors, I will hold on a chest radiograph and CBC at this time.  I discussed that approximately 50% of patients with acute bronchitis have a cough that lasts up to three weeks, and 25% for over a month.  Tylenol  500mg  one to two tablets every four to six hours as needed for fever or myalgias.  Exitcare handout on bronchitis  Re-evaluation Thursday 30 Nov 2023 walk in with NP.[email or web address]   ER if hemopthysis, SOB, worst chest pain of life.    Patient verbalized agreement and understanding of treatment plan.  P2:  hand washing and cover cough

## 2023-12-12 ENCOUNTER — Other Ambulatory Visit: Payer: Self-pay | Admitting: Internal Medicine

## 2023-12-12 DIAGNOSIS — Z1231 Encounter for screening mammogram for malignant neoplasm of breast: Secondary | ICD-10-CM

## 2023-12-26 ENCOUNTER — Ambulatory Visit
Admission: RE | Admit: 2023-12-26 | Discharge: 2023-12-26 | Disposition: A | Discharge status: Home / Self Care | Payer: No Typology Code available for payment source | Source: Ambulatory Visit | Attending: Family Medicine | Admitting: Family Medicine

## 2023-12-26 DIAGNOSIS — Z1231 Encounter for screening mammogram for malignant neoplasm of breast: Secondary | ICD-10-CM

## 2024-02-09 ENCOUNTER — Other Ambulatory Visit (HOSPITAL_COMMUNITY)
Admission: RE | Admit: 2024-02-09 | Discharge: 2024-02-09 | Disposition: A | Discharge status: Home / Self Care | Source: Ambulatory Visit | Attending: Obstetrics and Gynecology | Admitting: Obstetrics and Gynecology

## 2024-02-09 ENCOUNTER — Ambulatory Visit: Payer: No Typology Code available for payment source | Admitting: Obstetrics and Gynecology

## 2024-02-09 ENCOUNTER — Encounter: Payer: Self-pay | Admitting: Obstetrics and Gynecology

## 2024-02-09 VITALS — BP 118/86 | HR 77 | Ht 64.5 in | Wt 203.0 lb

## 2024-02-09 DIAGNOSIS — Z780 Asymptomatic menopausal state: Secondary | ICD-10-CM | POA: Diagnosis not present

## 2024-02-09 DIAGNOSIS — Z01419 Encounter for gynecological examination (general) (routine) without abnormal findings: Secondary | ICD-10-CM

## 2024-02-09 DIAGNOSIS — E2839 Other primary ovarian failure: Secondary | ICD-10-CM

## 2024-02-09 DIAGNOSIS — D219 Benign neoplasm of connective and other soft tissue, unspecified: Secondary | ICD-10-CM

## 2024-02-09 DIAGNOSIS — N920 Excessive and frequent menstruation with regular cycle: Secondary | ICD-10-CM

## 2024-02-09 MED ORDER — INTRAROSA 6.5 MG VA INST: 1.0000 | VAGINAL_INSERT | Freq: Every evening | VAGINAL | 12 refills | Status: AC | PRN

## 2024-02-09 NOTE — Progress Notes (Signed)
 63 y.o. y.o. female here for annual exam. No LMP recorded. Patient is postmenopausal.     The patient presents for annual exam.  Postmenopausal.  Using coconut oil but is having some spotting and pain with intercourse.. Normal Pap and mammogram history.  2025 MMG, Reports normal labs at work screening with PCP. Benign colon polyp 2009, negative colonoscopy 2010 and 2023. 10/2012 Osteopenia, T score  -1.1 left femoral neck, FRAX  8.8%/0.5%. 2020 normal. Referral placed for new scan. H/o fracture in hand with fall  Body mass index is 34.31 kg/m.      No data to display          Blood pressure 118/86, pulse 77, height 5' 4.5" (1.638 m), weight 203 lb (92.1 kg), SpO2 95%.  No results found for: "DIAGPAP", "HPVHIGH", "ADEQPAP"  GYN HISTORY: No results found for: "DIAGPAP", "HPVHIGH", "ADEQPAP"  OB History  Gravida Para Term Preterm AB Living  6 2 2   2   SAB IAB Ectopic Multiple Live Births          # Outcome Date GA Lbr Len/2nd Weight Sex Type Anes PTL Lv  6 Term           5 Term           4 Gravida           3 Gravida           2 Gravida           1 [place]             Past Medical History:  Diagnosis Date   Diverticulosis    GERD (gastroesophageal reflux disease)    Hyperlipemia     Past Surgical History:  Procedure Laterality Date   BREAST BIOPSY Right    neg- core   COLONOSCOPY     POLYPECTOMY     SIGMOIDOSCOPY      Current Outpatient Medications on File Prior to Visit  Medication Sig Dispense Refill   amLODipine (NORVASC) 2.5 MG tablet Take 2.5 mg by mouth daily.     COVID-19 At-Home Test KIT Place 1 Device into alternate nostrils daily as needed.     cyanocobalamin (VITAMIN B12) 500 MCG tablet Take 500 mcg by mouth daily.     loratadine (CLARITIN) 10 MG tablet Take 1 tablet (10 mg total) by mouth daily.     Magnesium 500 MG TABS Take by mouth.     Phytonadione (VITAMIN K1) POWD Take 100 mcg by mouth daily after lunch.     Vitamin D,  Ergocalciferol, (DRISDOL) 1.25 MG (50000 UNIT) CAPS capsule Take 50,000 Units by mouth every 7 (seven) days.     fluticasone (FLONASE) 50 MCG/ACT nasal spray Place 1 spray into both nostrils 2 (two) times daily. 16 g 4   predniSONE (STERAPRED UNI-PAK 21 TAB) 10 MG (21) TBPK tablet Taper take 6 pills by mouth day 1; 5 pills day 2; 4 pills day 3; 3 pills day 4; 2 pills day 5 and 1 pill day 6 with breakfast daily (Patient not taking: Reported on 02/09/2024)     sodium chloride (OCEAN) 0.65 % SOLN nasal spray Place 2 sprays into both nostrils every 2 (two) hours while awake.     No current facility-administered medications on file prior to visit.    Social History   Socioeconomic History   Marital status: Married    Spouse name: Not on file   Number of children: Not on file  Years of education: Not on file   Highest education level: Not on file  Occupational History   Occupation: [place] [employer]    Employer: [employer]  Tobacco Use   Smoking status: Former   Smokeless tobacco: Never   Tobacco comments:    Quit 10 yrs ago   Vaping Use   Vaping status: Never Used  Substance and Sexual Activity   Alcohol use: Yes    Comment: Wine on occ   Drug use: No   Sexual activity: Yes    Partners: Male    Birth control/protection: None  Other Topics Concern   Not on file  Social History Narrative   Caffeine daily    Social Drivers of Health   Financial Resource Strain: Not on file  Food Insecurity: Low Risk  (12/16/2023)   Received from [facility]   Hunger Vital Sign    Worried About Running Out of Food in the Last Year: Never true    Ran Out of Food in the Last Year: Never true  Transportation Needs: No Transportation Needs (12/16/2023)   Received from [facility]    In the past 12 months, has lack of reliable transportation kept you from medical appointments, meetings, work or from getting things needed for daily living? : No  Physical Activity: Not on file   Stress: Not on file  Social Connections: Unknown (04/07/2022)   Received from [facility], [facility]   Social Network    Social Network: Not on file  Intimate Partner Violence: Unknown (02/28/2022)   Received from [facility], [facility]   HITS    Physically Hurt: Not on file    Insult or Talk Down To: Not on file    Threaten Physical Harm: Not on file    Scream or Curse: Not on file    Family History  Problem Relation Age of Onset   Lung cancer Father    Colon cancer Neg Hx    Breast cancer Neg Hx      Allergies  Allergen Reactions   Penicillins Other (See Comments)    Childhood allergy; unk rxn  Childhood allergy, unsure what type of reaction, Childhood allergy, unsure what type of reaction      Patient's last menstrual period was No LMP recorded. Patient is postmenopausal..            Review of Systems Alls systems reviewed and are negative.     Physical Exam Constitutional:      Appearance: Normal appearance.  Genitourinary:     Vulva and urethral meatus normal.     No lesions in the vagina.     Genitourinary Comments: Possible uterine fibroids     Right Labia: No rash, lesions or skin changes.    Left Labia: No lesions, skin changes or rash.    No vaginal discharge or tenderness.     No vaginal prolapse present.    No vaginal atrophy present.     Right Adnexa: not tender, not palpable and no mass present.    Left Adnexa: not tender, not palpable and no mass present.    No cervical motion tenderness or discharge.     Uterus is enlarged and irregular.     Uterus is not tender.  Breasts:    Right: Normal.     Left: Normal.  HENT:     Head: Normocephalic.  Neck:     Thyroid: No thyroid mass, thyromegaly or thyroid tenderness.  Cardiovascular:  Rate and Rhythm: Normal rate and regular rhythm.     Heart sounds: Normal heart sounds, S1 normal and S2 normal.  Pulmonary:     Effort: Pulmonary effort is normal.     Breath sounds: Normal  breath sounds and air entry.  Abdominal:     General: There is no distension.     Palpations: Abdomen is soft. There is no mass.     Tenderness: There is no abdominal tenderness. There is no guarding or rebound.  Musculoskeletal:        General: Normal range of motion.     Cervical back: Full passive range of motion without pain, normal range of motion and neck supple. No tenderness.     Right lower leg: No edema.     Left lower leg: No edema.  Neurological:     Mental Status: She is alert.  Skin:    General: Skin is warm.  Psychiatric:        Mood and Affect: Mood normal.        Behavior: Behavior normal.        Thought Content: Thought content normal.  Vitals and nursing note reviewed. Exam conducted with a chaperone present.       A:         Well Woman GYN exam                             P:        Pap smear collected today Encouraged annual mammogram screening Colon cancer screening up-to-date DXA ordered today Labs and immunizations to do with PMD Discussed breast self exams Encouraged healthy lifestyle practices Encouraged Vit D and Calcium  TO get PUS to evaluate for fibroids and to visualize endometrial lining  No follow-ups on file.  Earley Favor

## 2024-02-12 LAB — CYTOLOGY - PAP: Diagnosis: NEGATIVE

## 2024-03-05 ENCOUNTER — Encounter: Payer: Self-pay | Admitting: Radiology

## 2024-03-07 ENCOUNTER — Ambulatory Visit

## 2024-03-07 ENCOUNTER — Encounter: Payer: Self-pay | Admitting: Obstetrics and Gynecology

## 2024-03-07 ENCOUNTER — Ambulatory Visit: Admitting: Obstetrics and Gynecology

## 2024-03-07 VITALS — BP 120/80 | HR 72

## 2024-03-07 DIAGNOSIS — N920 Excessive and frequent menstruation with regular cycle: Secondary | ICD-10-CM | POA: Diagnosis not present

## 2024-03-07 DIAGNOSIS — D219 Benign neoplasm of connective and other soft tissue, unspecified: Secondary | ICD-10-CM | POA: Diagnosis not present

## 2024-03-07 DIAGNOSIS — Z712 Person consulting for explanation of examination or test findings: Secondary | ICD-10-CM

## 2024-03-07 DIAGNOSIS — Z01419 Encounter for gynecological examination (general) (routine) without abnormal findings: Secondary | ICD-10-CM

## 2024-03-07 DIAGNOSIS — E2839 Other primary ovarian failure: Secondary | ICD-10-CM

## 2024-03-07 NOTE — Progress Notes (Signed)
   Acute Office Visit  Subjective:    Patient ID: Kimberly Hall, female    DOB: 02-04-1961, 63 y.o.   MRN: [id number]   HPI 63 y.o. presents today for ultrasound (Ultrasound & consult//jj) Patient presents to discuss PUS results to rule out fibroids and evaluate endometrial lining. No PMB Is going to start intrarosa for vaginal dryness and dyspareunia  No LMP recorded. Patient is postmenopausal.    Review of Systems     Objective:    OBGyn Exam  BP 120/80   Pulse 72   SpO2 99%  Wt Readings from Last 3 Encounters:  02/09/24 203 lb (92.1 kg)  11/28/23 202 lb (91.6 kg)  04/29/22 194 lb (88 kg)       PUS 4.70cm anteverted uterus No myometrial masses Atrophic ovaries No adnexal masses No free fluid  Assessment & Plan:  PUS results  Counseled on normal PUS results. Encouraged dxa scan. She has next week Counseled on safety and importance of intrarosa. Can try other vaginal estrogen options but does not have testosterone for the libido  15 minutes spent on reviewing records, imaging,  and one on one patient time and counseling patient and documentation Dr. Judith Blonder

## 2024-03-11 ENCOUNTER — Ambulatory Visit (HOSPITAL_BASED_OUTPATIENT_CLINIC_OR_DEPARTMENT_OTHER)
Admission: RE | Admit: 2024-03-11 | Discharge: 2024-03-11 | Disposition: A | Discharge status: Home / Self Care | Source: Ambulatory Visit | Attending: Obstetrics and Gynecology | Admitting: Obstetrics and Gynecology

## 2024-03-11 ENCOUNTER — Encounter: Payer: Self-pay | Admitting: Obstetrics and Gynecology

## 2024-03-11 DIAGNOSIS — E2839 Other primary ovarian failure: Secondary | ICD-10-CM | POA: Insufficient documentation

## 2024-05-14 ENCOUNTER — Telehealth: Payer: Self-pay | Admitting: Registered Nurse

## 2024-05-14 ENCOUNTER — Encounter: Payer: Self-pay | Admitting: Registered Nurse

## 2024-05-14 DIAGNOSIS — Z0000 Encounter for general adult medical examination without abnormal findings: Secondary | ICD-10-CM

## 2024-05-14 NOTE — Telephone Encounter (Signed)
 Had labs drawn at [facility] 05/10/24 LDL 186 Hgba1c 5.4 BP 130/80 weight 204lbs height 64  patient signed HIPAA release, Be Well 2026 ROI and tobacco attestation with RN Thersia Flax  met requirements for insurance discount starting 28 Aug 2024 [employer] form given to HR Jen 05/14/24 and signed provider portion Be Well 2026 paperwork  Previous results 130/80 LDL 147 Hgba1c 5.6 weight [id number] 2025 Be Well results 02/21/23

## 2024-06-11 DIAGNOSIS — I209 Angina pectoris, unspecified: Secondary | ICD-10-CM | POA: Insufficient documentation

## 2024-11-19 ENCOUNTER — Ambulatory Visit: Admitting: Registered Nurse

## 2024-11-19 VITALS — BP 164/78 | HR 74 | Temp 97.7°F | Resp 18

## 2024-11-19 DIAGNOSIS — J209 Acute bronchitis, unspecified: Secondary | ICD-10-CM

## 2024-11-19 DIAGNOSIS — J069 Acute upper respiratory infection, unspecified: Secondary | ICD-10-CM

## 2024-11-19 MED ORDER — FLUTICASONE PROPIONATE 50 MCG/ACT NA SUSP: 1.0000 | Freq: Two times a day (BID) | NASAL | 4 refills | Status: AC

## 2024-11-19 MED ORDER — ALBUTEROL SULFATE HFA 108 (90 BASE) MCG/ACT IN AERS: 1.0000 | INHALATION_SPRAY | RESPIRATORY_TRACT | 0 refills | Status: DC | PRN

## 2024-11-19 MED ORDER — SALINE SPRAY 0.65 % NA SOLN: 2.0000 | NASAL | 0 refills | Status: AC

## 2024-11-19 MED ORDER — BENZONATATE 200 MG PO CAPS: 200.0000 mg | ORAL_CAPSULE | Freq: Three times a day (TID) | ORAL | 0 refills | Status: AC | PRN

## 2024-11-19 MED ORDER — PREDNISONE 10 MG PO TABS: ORAL_TABLET | ORAL | Status: AC

## 2024-11-19 NOTE — Patient Instructions (Signed)
 How to Perform a Sinus Rinse A sinus rinse is a home treatment that is used to rinse your sinuses with a germ-free (sterile) mixture of salt and water (saline solution). Sinuses are air-filled spaces in your skull that are behind the bones of your face and forehead. They open into your nasal cavity. A sinus rinse can help to clear mucus, dirt, dust, or pollen from your nasal cavity. You may do a sinus rinse when you have a cold, a virus, nasal allergy symptoms, a sinus infection, or stuffiness in your nose or sinuses. What are the risks? A sinus rinse is generally safe and effective. However, there are a few risks, which include: A burning sensation in your sinuses. This may happen if you do not make the saline solution as directed. Be sure to follow all directions when making the saline solution. Nasal irritation. Infection. This may be from unclean supplies or from contaminated water. Infection from contaminated water is rare, but possible. Do not do a sinus rinse if you have had ear or nasal surgery, ear infection, or plugged ears, unless recommended by your health care provider. Supplies needed: Saline solution or powder. Distilled or sterile water to mix with saline powder. You may use boiled and cooled tap water. Boil tap water for 5 minutes; cool until it is lukewarm. Use within 24 hours. Do not use regular tap water to mix with the saline solution. Neti pot or nasal rinse bottle. These supplies release the saline solution into your nose and through your sinuses. Neti pots and nasal rinse bottles can be purchased at charity fundraiser, a health food store, or online. How to perform a sinus rinse  Wash your hands with soap and water for at least 20 seconds. If soap and water are not available, use hand sanitizer. Wash your device according to the directions that came with the product and then dry it. Use the solution that comes with your product or one that is sold separately in stores.  Follow the mixing directions on the package to mix with sterile or distilled water. Fill the device with the amount of saline solution noted in the device instructions. Stand by a sink and tilt your head sideways over the sink. Place the spout of the device in your upper nostril (the one closer to the ceiling). Gently pour or squeeze the saline solution into your nasal cavity. The liquid should drain out from the lower nostril if you are not too congested. While rinsing, breathe through your open mouth. Gently blow your nose to clear any mucus and rinse solution. Blowing too hard may cause ear pain. Turn your head in the other direction and repeat in your other nostril. Clean and rinse your device with clean water and then air-dry it. Talk with your health care provider or pharmacist if you have questions about how to do a sinus rinse. Summary A sinus rinse is a home treatment that is used to rinse your sinuses with a sterile mixture of salt and water (saline solution). You may do a sinus rinse when you have a cold, a virus, nasal allergy symptoms, a sinus infection, or stuffiness in your nose or sinuses. A sinus rinse is generally safe and effective. Follow all instructions carefully. This information is not intended to replace advice given to you by your health care provider. Make sure you discuss any questions you have with your health care provider. Document Revised: 05/03/2021 Document Reviewed: 05/03/2021 Elsevier Patient Education  2024 [employer]Acute Bronchitis,  Adult  Acute bronchitis is sudden inflammation of the main airways (bronchi) that come off the windpipe (trachea) in the lungs. The swelling causes the airways to get smaller and make more mucus than normal. This can make it hard to breathe and can cause coughing or noisy breathing (wheezing). Acute bronchitis may last several weeks. The cough may last longer. Allergies, asthma, and exposure to smoke may make the condition  worse. What are the causes? This condition can be caused by germs and by substances that irritate the lungs, including: Cold and flu viruses. The most common cause of this condition is the virus that causes the common cold. Bacteria. This is less common. Breathing in substances that irritate the lungs, including: Smoke from cigarettes and other forms of tobacco. Dust and pollen. Fumes from household cleaning products, gases, or burned fuel. Indoor or outdoor air pollution. What increases the risk? The following factors may make you more likely to develop this condition: A weak body's defense system, also called the immune system. A condition that affects your lungs and breathing, such as asthma. What are the signs or symptoms? Common symptoms of this condition include: Coughing. This may bring up clear, yellow, or green mucus from your lungs (sputum). Wheezing. Runny or stuffy nose. Having too much mucus in your lungs (chest congestion). Shortness of breath. Aches and pains, including sore throat or chest. How is this diagnosed? This condition is usually diagnosed based on: Your symptoms and medical history. A physical exam. You may also have other tests, including tests to rule out other conditions, such as pneumonia. These tests include: A test of lung function. Test of a mucus sample to look for the presence of bacteria. Tests to check the oxygen level in your blood. Blood tests. Chest X-ray. How is this treated? Most cases of acute bronchitis clear up over time without treatment. Your health care provider may recommend: Drinking more fluids to help thin your mucus so it is easier to cough up. Taking inhaled medicine (inhaler) to improve air flow in and out of your lungs. Using a vaporizer or a humidifier. These are machines that add water to the air to help you breathe better. Taking a medicine that thins mucus and clears congestion (expectorant). Taking a medicine that  prevents or stops coughing (cough suppressant). It is not common to take an antibiotic medicine for this condition. Follow these instructions at home:  Take over-the-counter and prescription medicines only as told by your health care provider. Use an inhaler, vaporizer, or humidifier as told by your health care provider. Take two teaspoons (10 mL) of honey at bedtime to lessen coughing at night. Drink enough fluid to keep your urine pale yellow. Do not use any products that contain nicotine or tobacco. These products include cigarettes, chewing tobacco, and vaping devices, such as e-cigarettes. If you need help quitting, ask your health care provider. Get plenty of rest. Return to your normal activities as told by your health care provider. Ask your health care provider what activities are safe for you. Keep all follow-up visits. This is important. How is this prevented? To lower your risk of getting this condition again: Wash your hands often with soap and water for at least 20 seconds. If soap and water are not available, use hand sanitizer. Avoid contact with people who have cold symptoms. Try not to touch your mouth, nose, or eyes with your hands. Avoid breathing in smoke or chemical fumes. Breathing smoke or chemical fumes will make your  condition worse. Get the flu shot every year. Contact a health care provider if: Your symptoms do not improve after 2 weeks. You have trouble coughing up the mucus. Your cough keeps you awake at night. You have a fever. Get help right away if you: Cough up blood. Feel pain in your chest. Have severe shortness of breath. Faint or keep feeling like you are going to faint. Have a severe headache. Have a fever or chills that get worse. These symptoms may represent a serious problem that is an emergency. Do not wait to see if the symptoms will go away. Get medical help right away. Call your local emergency services (911 in the [place] Do not drive  yourself to the hospital. Summary Acute bronchitis is inflammation of the main airways (bronchi) that come off the windpipe (trachea) in the lungs. The swelling causes the airways to get smaller and make more mucus than normal. Drinking more fluids can help thin your mucus so it is easier to cough up. Take over-the-counter and prescription medicines only as told by your health care provider. Do not use any products that contain nicotine or tobacco. These products include cigarettes, chewing tobacco, and vaping devices, such as e-cigarettes. If you need help quitting, ask your health care provider. Contact a health care provider if your symptoms do not improve after 2 weeks. This information is not intended to replace advice given to you by your health care provider. Make sure you discuss any questions you have with your health care provider. Document Revised: 02/24/2022 Document Reviewed: 03/17/2021 Elsevier Patient Education  2024 [employer].

## 2024-11-19 NOTE — Progress Notes (Signed)
 "  Established Patient Office Visit  Subjective   Patient ID: Kimberly Hall, female    DOB: [birth year]  Age: 63 y.o. MRN: [id number]  Chief Complaint  Patient presents with   Cough    Runny nose    63y/o caucasian female established patient has been sick for 2 weeks and cough harsh neck strain and feeling tight in anterior neck/chest.  Mucous clear/yellow.  Denied fever/chills/n/v/d/hemoptysis.  Sometimes wheezing different times of day varies.  Has used tessalon  pearles in the past and helped.  Prednisone  doesn't like how it makes her feel.  Has used tea/honey mainly the past couple of weeks.  Cough not interrupting sleep  On chart review was sick 11/28/23 also prednisone  taper 60mg  patient requested stronger than OTC cough medicine and refill on albuterol  inhaler      Review of Systems  Constitutional:  Positive for malaise/fatigue. Negative for chills and fever.  HENT:  Positive for congestion and sinus pain. Negative for ear discharge, ear pain and nosebleeds.   Respiratory:  Positive for cough, sputum production and wheezing. Negative for hemoptysis, shortness of breath and stridor.   Gastrointestinal:  Negative for constipation, diarrhea and vomiting.  Musculoskeletal:  Positive for myalgias and neck pain.  Skin:  Negative for rash.  Neurological:  Positive for headaches. Negative for dizziness, tingling, tremors, focal weakness, seizures, loss of consciousness and weakness.  Psychiatric/Behavioral:  The patient does not have insomnia.       Objective:     BP (!) 164/78 (BP Location: Left Arm, Patient Position: Sitting, Cuff Size: Large)   Pulse 74   Temp 97.7 F (36.5 C) (Tympanic)   Resp 18   SpO2 98%    Physical Exam Vitals and nursing note reviewed.  Constitutional:      General: She is not in acute distress.    Appearance: She is well-developed and well-groomed. She is obese. She is ill-appearing. She is not toxic-appearing or diaphoretic.  HENT:     Head:  Normocephalic and atraumatic.     Jaw: There is normal jaw occlusion.     Salivary Glands: Right salivary gland is not diffusely enlarged or tender. Left salivary gland is not diffusely enlarged or tender.     Right Ear: Hearing, ear canal and external ear normal. No decreased hearing noted. No laceration, drainage, swelling or tenderness. A middle ear effusion is present. There is no impacted cerumen. No foreign body. No mastoid tenderness. No PE tube. No hemotympanum. Tympanic membrane is not injected, scarred, perforated, erythematous or retracted.     Left Ear: Hearing, ear canal and external ear normal. No decreased hearing noted. No laceration, drainage, swelling or tenderness. A middle ear effusion is present. There is no impacted cerumen. No foreign body. No mastoid tenderness. No PE tube. No hemotympanum. Tympanic membrane is not injected, scarred, perforated, erythematous or retracted.     Ears:     Comments: Bilateral TMs intact air fluid level clear; no debris bilateral canals    Nose: Mucosal edema, congestion and rhinorrhea present. Rhinorrhea is clear.     Right Turbinates: Enlarged and swollen. Not pale.     Left Turbinates: Enlarged and swollen. Not pale.     Right Sinus: Maxillary sinus tenderness and frontal sinus tenderness present.     Left Sinus: Maxillary sinus tenderness and frontal sinus tenderness present.     Comments: Frontal greater than maxillary TTP bilaterally; cobblestoning posterior pharynx; bilateral allergic shiners; nasal sniffing observed in exam room; nasal turbinates edema/erythema/vasculature injected  Mouth/Throat:     Lips: Pink. No lesions.     Mouth: Mucous membranes are moist. No injury, oral lesions or angioedema.     Tongue: No lesions.     Palate: No mass and lesions.     Pharynx: Uvula midline. Pharyngeal swelling, posterior oropharyngeal erythema and postnasal drip present. No oropharyngeal exudate or uvula swelling.     Tonsils: No tonsillar  exudate or tonsillar abscesses.     Comments: Mild macular erythema oropharynx Eyes:     General: Lids are normal. Vision grossly intact. Gaze aligned appropriately. Allergic shiner present.     Conjunctiva/sclera: Conjunctivae normal.     Pupils: Pupils are equal, round, and reactive to light.  Neck:     Trachea: Trachea and phonation normal.  Cardiovascular:     Rate and Rhythm: Normal rate and regular rhythm.     Pulses: Normal pulses.          Radial pulses are 2+ on the right side and 2+ on the left side.     Heart sounds: Normal heart sounds, S1 normal and S2 normal.  Pulmonary:     Effort: Pulmonary effort is normal. No respiratory distress.     Breath sounds: Normal breath sounds and air entry. No stridor, decreased air movement or transmitted upper airway sounds. No decreased breath sounds, wheezing, rhonchi or rales.     Comments: BBS CTA intermittent nonproductive cough in exam room; some throat clearing clear mucous Abdominal:     Palpations: Abdomen is soft.  Musculoskeletal:        General: No swelling, tenderness or signs of injury. Normal range of motion.     Right hand: Normal strength. Normal capillary refill.     Left hand: Normal strength. Normal capillary refill.     Cervical back: Normal range of motion and neck supple. No swelling, edema, deformity, erythema, signs of trauma, lacerations, rigidity, spasms, torticollis, tenderness, bony tenderness or crepitus. Pain with movement present. Normal range of motion.     Thoracic back: No swelling, edema, deformity, signs of trauma, lacerations, spasms or tenderness. Normal range of motion.  Lymphadenopathy:     Head:     Right side of head: No submental, submandibular, tonsillar, preauricular, posterior auricular or occipital adenopathy.     Left side of head: No submental, submandibular, tonsillar, preauricular, posterior auricular or occipital adenopathy.     Cervical: No cervical adenopathy.     Right cervical: No  superficial, deep or posterior cervical adenopathy.    Left cervical: No superficial, deep or posterior cervical adenopathy.  Skin:    General: Skin is warm and dry.     Capillary Refill: Capillary refill takes less than 2 seconds.     Coloration: Skin is not ashen, cyanotic, jaundiced, mottled, pale or sallow.     Findings: No abrasion, abscess, acne, bruising, burn, ecchymosis, erythema, signs of injury, laceration, petechiae, rash or wound.     Comments: Anterior face/neck visually inspected  Neurological:     General: No focal deficit present.     Mental Status: She is alert and oriented to person, place, and time. Mental status is at baseline.     GCS: GCS eye subscore is 4. GCS verbal subscore is 5. GCS motor subscore is 6.     Cranial Nerves: Cranial nerves 2-12 are intact. No cranial nerve deficit, dysarthria or facial asymmetry.     Motor: No weakness, tremor or abnormal muscle tone.     Coordination: Coordination is intact.  Coordination normal.     Gait: Gait is intact.     Comments: On/off exam table and in/out of chair without difficulty; gait sure and steady bilateral hand grasp equal 5/5  Psychiatric:        Speech: Speech normal.        Behavior: Behavior normal. Behavior is cooperative.        Thought Content: Thought content normal.        Judgment: Judgment normal.    Intermittent dry cough in exam room and nasal congestion; cobblestoning posterior pharynx; chest tightness  No results found for any visits on 11/19/24.    The 10-year ASCVD risk score (Arnett DK, et al., 2019) is: 9%    Assessment & Plan:   Problem List Items Addressed This Visit   None Visit Diagnoses       Acute bronchitis, unspecified organism    -  Primary     Discussed viral URI mucous clear to yellow to green then done.  Many circulating in community at this time.  Nasal saline 2 sprays each nostril q2h wa prn congestion; fluticasone  nasal 50mg  1 spray each nostril BID and may continue  honey 1 tablespoon q4h prn cough.  Hydrate with water.  Eat regular meals and stay hydrated with water to keep urine pale yellow clear and voiding every 2-4 hours. Electronic rx to her pharmacy of choice albuterol  inhaler 1-2puffs po q4-6h prn protracted coughing/wheezing/chest tightness #1 RF0 and tessalon  pearles 200mg  po q8h prn cough #30 RF0, dispensed from [facility] prednisone  taper take first dose now 40mg  tomorrow 40mg  then 30mg x 2days then 20mg  x 2 days then 10mg x2 days #21 RF0  Notify clinic staff if chunky mucous, hemoptysis, tan or pink opaque mucous or dyspnea/fever/chills/shortness of breath.  Patient agreed with plan of care and had no further questions at this time. Return if symptoms worsen or fail to improve.    Ellouise DELENA Hope, NP  "

## 2024-12-02 ENCOUNTER — Encounter: Payer: Self-pay | Admitting: Registered Nurse

## 2024-12-02 VITALS — BP 160/84 | HR 98 | Temp 101.8°F | Resp 20

## 2024-12-02 DIAGNOSIS — R6889 Other general symptoms and signs: Secondary | ICD-10-CM

## 2024-12-02 DIAGNOSIS — J111 Influenza due to unidentified influenza virus with other respiratory manifestations: Secondary | ICD-10-CM

## 2024-12-02 MED ORDER — OSELTAMIVIR PHOSPHATE 75 MG PO CAPS: 75.0000 mg | ORAL_CAPSULE | Freq: Two times a day (BID) | ORAL | 0 refills | Status: AC

## 2024-12-02 MED ORDER — ALBUTEROL SULFATE HFA 108 (90 BASE) MCG/ACT IN AERS: 1.0000 | INHALATION_SPRAY | RESPIRATORY_TRACT | 0 refills | Status: AC | PRN

## 2024-12-02 MED ORDER — PREDNISONE 10 MG PO TABS: ORAL_TABLET | ORAL | 0 refills | Status: AC

## 2024-12-02 MED ORDER — BENZONATATE 200 MG PO CAPS: 200.0000 mg | ORAL_CAPSULE | Freq: Three times a day (TID) | ORAL | 0 refills | Status: AC | PRN

## 2024-12-02 NOTE — Progress Notes (Signed)
 Kimberly Hall presents to the clinic this afternoon with c/o chills, feeling feverish, fatigue, nasal congestion and cough. She has been exposed to sick contacts this past weekend. She reports coughing up and blowing yellow mucus from her nose. She feels she can no longer work today, has no appetite and hasn't been drinking any liquids.  Upon assessment, Kimberly Hall is very weak, pale in color, shaking from being febrile and has posterior rales in bilateral upper lobes. She requested Ibuprofen . RN provided Ibuprofen  400 mg and Medi-Mucus with water for her to take right then. RN collaborated with Kimberly Hope, NP of clinic for further interventions. Will follow up with Kimberly Hall later today to ensure medications have been started assess status.

## 2024-12-02 NOTE — Telephone Encounter (Signed)
 See RN Karene note patient with fever in clinic today and URI symptoms negative covid test known sick contact.  Electronic Rx to pharmacy of choice tamiflu  75mg  po BID x 5 days #10 RF0; prednisone  taper 10mg  po daily with breakfast (60/50/40/30/20/10) #21 RF0; refill albuterol  inhaler 1-2 puffs po q4-6h prn protracted cough/wheezing/chest tightness and tessalon  pearles 200mg  po TID prn cough #30 RF0 also sent.

## 2024-12-03 NOTE — Telephone Encounter (Signed)
 Patient reported picked up tamiflu  and prednisone  Rx and started them this am.  Difficulty sleeping due to cough and chills last night.  Had left over cough rx medicine from previous illness and using it instead of tessalon  pearles.  Has not used inhaler.  Patient feeling poorly crying as her Christmas celebration supposed to start tomorrow.  Asking why she is getting sick so often this year.  Discussed sleep 7-8 hours per night, hydration, eating rainbow variety of fruits and vegetables and managing stress consider counselor if acute/chronic stressors.  Patient denied feeling stressed at this time.  Discussed if protracted coughing/chest tightness/wheezing to use her inhaler 1-2 puffs po q4-6 hours.  She stated she is tolerating po intake and hydrating.  Headache along with chills also using ibuprofen  po prn.  She feels that medication is helping.  Excused absence extended 24 hours and re-evaluation with RN Karene tomorrow.  Patient spoke full sentences without difficulty.  Intermittent harsh cough audible during 8 minute phone call.  No nasal congestion audible or throat clearing.  Patient A&Ox3  Patient agreed with plan of care and had no further questions at this time.  HR and supervisor team notified of excused absence 12/03/24

## 2024-12-04 ENCOUNTER — Telehealth: Payer: Self-pay | Admitting: General Practice

## 2024-12-04 NOTE — Telephone Encounter (Signed)
 RN placed a call to Kimberly Hall this afternoon to discuss her progress. She reports this is day 3 of taking the Prednisone  and Tamiflu , also Pearls that were not expired at home for her cough. She has not used the inhaler, she doesn't think she needs it. She states feeling some better, although she has no fever, she continues to cough (sounds productive over the phone), is very weak with shaky legs when she gets up to walk. She is able to drink liquids but has no appetite and not able to eat soup. She denies coughing up phlegm. She denies shortness of breath. RN advised to continue drinking all the liquids she can tolerate to remain hydrated and continue taking the medication as prescribed. Will follow up tomorrow.

## 2024-12-07 ENCOUNTER — Telehealth: Payer: Self-pay | Admitting: General Practice

## 2024-12-07 NOTE — Telephone Encounter (Signed)
 Call placed to Kimberly Hall on 12/06/2024 to assess progress of illness. Left generic message stating would call back on Monday, 12/09/2024 if not returned to work. Requested she call or email the clinic prior to Monday if she has needs. Contact information provided.

## 2024-12-09 ENCOUNTER — Telehealth: Payer: Self-pay | Admitting: General Practice

## 2024-12-09 NOTE — Telephone Encounter (Signed)
 RN placed call to Chevie this morning to follow up on progress s/p the flu. Call attempted on 12/06/24 and voicemail left. She reports feeling better than she did last week at onset of illness. She has completed Prednisone  and Tamiflu  but still has a bad cough. Reports still weak but has a good appetite, has been hydrating with water and goat's milk. Continues to have nasal and chest congestion, coughing/blowing out yellow phlegm/mucus. States she coughs a lot when talking only. At rest, she does not cough. Has been taking pearls and using inhaler yesterday. Reports taking her temp and she has been afebrile.   During the call, RN notes Shenell having a very coarse cough, sounds productive with throat rattling. Also sounds like nasal congestion. RN advised to continue taking Tessalon  pearls, using inhaler every 4 hours and hydrating to thin secretions. Discussed drinking more water and clear liquids as tea will dehydrate and milk may thicken secretions. She may be able to return to work tomorrow am if she feels better. RN recommended she stop by the clinic prior to her job site and get masks to wear during the day. Will follow up tomorrow.

## 2024-12-10 NOTE — Telephone Encounter (Signed)
 Patient seen in [facility] sp02 95% RA rare cough spoke full sentences without difficulty BBS CTA skin warm dry and pink respirations even and unlabored.  Last used inhaler yesterday.  Still using tea as she describes tickle in throat will start off coughing fits intermittently  mucous yellow denied fever/chills/n/v/d/new symptoms.
